# Patient Record
Sex: Female | Born: 1985 | Race: White | Hispanic: No | Marital: Married | Smoking: Never smoker
Health system: Southern US, Community
[De-identification: ages and names within clinical notes are randomized; demographics above are authoritative.]

## PROBLEM LIST (undated history)

## (undated) DIAGNOSIS — K219 Gastro-esophageal reflux disease without esophagitis: Secondary | ICD-10-CM

## (undated) DIAGNOSIS — Z973 Presence of spectacles and contact lenses: Secondary | ICD-10-CM

## (undated) DIAGNOSIS — M1990 Unspecified osteoarthritis, unspecified site: Secondary | ICD-10-CM

## (undated) DIAGNOSIS — J45909 Unspecified asthma, uncomplicated: Secondary | ICD-10-CM

## (undated) DIAGNOSIS — E669 Obesity, unspecified: Secondary | ICD-10-CM

## (undated) DIAGNOSIS — O139 Gestational [pregnancy-induced] hypertension without significant proteinuria, unspecified trimester: Secondary | ICD-10-CM

## (undated) DIAGNOSIS — R51 Headache: Secondary | ICD-10-CM

## (undated) DIAGNOSIS — M359 Systemic involvement of connective tissue, unspecified: Secondary | ICD-10-CM

## (undated) DIAGNOSIS — D699 Hemorrhagic condition, unspecified: Secondary | ICD-10-CM

## (undated) HISTORY — DX: Headache: R51

## (undated) HISTORY — PX: ABDOMINAL HYSTERECTOMY: SHX81

## (undated) HISTORY — DX: Obesity, unspecified: E66.9

## (undated) HISTORY — PX: WISDOM TOOTH EXTRACTION: SHX21

---
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
## 2004-03-10 ENCOUNTER — Ambulatory Visit: Payer: Self-pay | Admitting: Unknown Physician Specialty

## 2005-01-28 ENCOUNTER — Emergency Department: Payer: Self-pay | Admitting: Emergency Medicine

## 2005-02-25 ENCOUNTER — Emergency Department: Payer: Self-pay | Admitting: Emergency Medicine

## 2005-05-03 ENCOUNTER — Emergency Department: Payer: Self-pay | Admitting: Emergency Medicine

## 2007-05-26 ENCOUNTER — Emergency Department: Payer: Self-pay | Admitting: Emergency Medicine

## 2007-07-05 ENCOUNTER — Observation Stay: Payer: Self-pay | Admitting: Unknown Physician Specialty

## 2007-07-14 ENCOUNTER — Inpatient Hospital Stay: Payer: Self-pay

## 2008-08-05 ENCOUNTER — Emergency Department: Payer: Self-pay | Admitting: Emergency Medicine

## 2008-12-28 ENCOUNTER — Inpatient Hospital Stay: Payer: Self-pay | Admitting: Obstetrics and Gynecology

## 2009-12-20 ENCOUNTER — Emergency Department: Payer: Self-pay | Admitting: Emergency Medicine

## 2010-05-15 ENCOUNTER — Emergency Department: Payer: Self-pay | Admitting: Emergency Medicine

## 2010-06-01 ENCOUNTER — Encounter: Payer: Self-pay | Admitting: Family Medicine

## 2010-06-01 ENCOUNTER — Ambulatory Visit (INDEPENDENT_AMBULATORY_CARE_PROVIDER_SITE_OTHER): Payer: Self-pay | Admitting: Family Medicine

## 2010-06-01 DIAGNOSIS — J309 Allergic rhinitis, unspecified: Secondary | ICD-10-CM | POA: Insufficient documentation

## 2010-06-01 DIAGNOSIS — H9209 Otalgia, unspecified ear: Secondary | ICD-10-CM

## 2010-06-01 DIAGNOSIS — J019 Acute sinusitis, unspecified: Secondary | ICD-10-CM

## 2010-06-02 ENCOUNTER — Telehealth: Payer: Self-pay | Admitting: Family Medicine

## 2010-06-10 NOTE — Progress Notes (Signed)
  Phone Note Call from Patient   Summary of Call: patient called in today states that she was seen yesterday wanted to know could you call in some medication in for coughing please send to [facility]  Initial call taken by: Eugenio Hoes,  June 02, 2010 2:45 PM    New/Updated Medications: GUIATUSS AC 100-10 MG/5ML SYRP (GUAIFENESIN-CODEINE) take 1 teaspoon by mouth every 4-6 hours as needed severe cough: May cause drowsiness. Prescriptions: GUIATUSS AC 100-10 MG/5ML SYRP (GUAIFENESIN-CODEINE) take 1 teaspoon by mouth every 4-6 hours as needed severe cough: May cause drowsiness.  #6 oz x 0   Entered and Authorized by:   Standley Dakins MD   Signed by:   Standley Dakins MD on 06/02/2010   Method used:   Printed then faxed to ...       [facility] [address] (retail)       [address], [address]       [place], [place]  [address]       Ph: [phone]       Fax: [phone]   RxID:   [id number]

## 2010-06-10 NOTE — Assessment & Plan Note (Signed)
Summary: ear,throat,sinus problems/jbb   Vital Signs:  Patient Profile:   25 Years Old Female CC:      earpain Right main one Height:     63.75 inches Weight:      168 pounds Temp:     98.3 degrees F oral Pulse rate:   76 / minute Pulse rhythm:   regular Resp:     20 per minute BP sitting:   100 / 70  (right arm) Cuff size:   regular  Vitals Entered By: Providence Crosby LPN (June 01, 5407 3:12 PM)                  Current Allergies: No known allergies History of Present Illness Chief Complaint: earpain Right main one History of Present Illness: right ear pain started 1 week ago with URI symptoms Friday 05/30/2010 ears started hurting and progressed to severe right ear pain that has kept her up at night crying.  She says she has severe nasal congestion and postnasal drainage.  No cough or chest congestion.  No fever or chills but has had a cool mist humidifier running at night.   She has fatigue and exposed to children at home that have been sick with URI and ear infections.  She has a history of migraine headaches and reports that she cannot take nasal sprays because it triggers an attack.  Her husband has been ill with an Upper respiratory illness.    REVIEW OF SYSTEMS Constitutional Symptoms      Denies fever, chills, night sweats, weight loss, weight gain, and fatigue.  Eyes       Complains of glasses and contact lenses.      Denies change in vision, eye pain, eye discharge, and eye surgery. Ear/Nose/Throat/Mouth       Complains of change in hearing, ear pain, dizziness, sinus problems, and sore throat.      Denies hearing loss/aids, ear discharge, frequent runny nose, frequent nose bleeds, hoarseness, and tooth pain or bleeding.  Respiratory       Denies dry cough, productive cough, wheezing, shortness of breath, asthma, bronchitis, and emphysema/COPD.  Cardiovascular       Denies murmurs, chest pain, and tires easily with exhertion.    Gastrointestinal       Denies stomach  pain, nausea/vomiting, diarrhea, constipation, blood in bowel movements, and indigestion. Genitourniary       Denies painful urination, kidney stones, and loss of urinary control. Neurological       Complains of headaches.      Denies paralysis, seizures, and fainting/blackouts. Musculoskeletal       Denies muscle pain, joint pain, joint stiffness, decreased range of motion, redness, swelling, muscle weakness, and gout.  Skin       Denies bruising, unusual mles/lumps or sores, and hair/skin or nail changes.  Psych       Denies mood changes, temper/anger issues, anxiety/stress, speech problems, depression, and sleep problems.  Past History:  Family History: Last updated: 06/01/2010 Father: alive unknown ere  Mother: alive 51 yoa has arthritis and IBS Siblings: 2 sisters alive and well  Social History: Last updated: 06/01/2010 Marital Status: Married Children: 2 boys Occupation: home  Past Medical History: History of migraines  Past Surgical History: Tonsillectomy 02/2004  Family History: Reviewed history and no changes required. Father: alive unknown ere  Mother: alive 45 yoa has arthritis and IBS Siblings: 2 sisters alive and well  Social History: Reviewed history and no changes required. Marital Status: Married  Children: 2 boys Occupation: home Physical Exam General appearance: well developed, well nourished, no acute distress Head: normocephalic, atraumatic Eyes: conjunctivae and lids normal Pupils: equal, round, reactive to light Ears: normal, no lesions or deformities Nasal: marked sinus and nasal congestion Oral/Pharynx: tongue normal, posterior pharynx without erythema or exudate Neck: neck supple,  trachea midline, no masses Chest/Lungs: no rales, wheezes, or rhonchi bilateral, breath sounds equal without effort Heart: regular rate and  rhythm, no murmur Abdomen: soft, non-tender without obvious organomegaly Extremities: normal extremities Neurological:  grossly intact and non-focal Skin: no obvious rashes or lesions MSE: oriented to time, place, and person Assessment New Problems: ACUTE SINUSITIS, UNSPECIFIED (ICD-461.9) ALLERGIC RHINITIS CAUSE UNSPECIFIED (ICD-477.9) EAR PAIN, RIGHT (ICD-388.70)   Patient Education: Patient and/or caregiver instructed in the following: rest, fluids, Tylenol prn. The risks, benefits and possible side effects were clearly explained and discussed with the patient.  The patient verbalized clear understanding.  The patient was given instructions to return if symptoms don't improve, worsen or new changes develop.  If it is not during clinic hours and the patient cannot get back to this clinic then the patient was told to seek medical care at an available urgent care or emergency department.  The patient verbalized understanding.   Demonstrates willingness to comply.  Plan New Medications/Changes: FLUCONAZOLE 150 MG TABS (FLUCONAZOLE) take 1 by mouth x 1 as needed for yeast infection  #1 x 0, 06/01/2010, Clanford Johnson MD AMOXICILLIN 875 MG TABS (AMOXICILLIN) take 1 by mouth two times a day until completed  #20 x 0, 06/01/2010, Clanford Johnson MD ZYRTEC-D ALLERGY & CONGESTION 5-120 MG XR12H-TAB (CETIRIZINE-PSEUDOEPHEDRINE) take 1 by mouth every 12 hours for congestion  #12 x 0, 06/01/2010, Clanford Johnson MD  Follow Up: Follow up in 2-3 days if no improvement, Follow up on an as needed basis, Follow up with Primary Physician  The patient and/or caregiver has been counseled thoroughly with regard to medications prescribed including dosage, schedule, interactions, rationale for use, and possible side effects and they verbalize understanding.  Diagnoses and expected course of recovery discussed and will return if not improved as expected or if the condition worsens. Patient and/or caregiver verbalized understanding.  Prescriptions: FLUCONAZOLE 150 MG TABS (FLUCONAZOLE) take 1 by mouth x 1 as needed for yeast  infection  #1 x 0   Entered and Authorized by:   Standley Dakins MD   Signed by:   Standley Dakins MD on 06/01/2010   Method used:   Electronically to        [facility] (retail)       [address], [address]       [place], [place]  [address]       Ph: [phone]       Fax: [phone]   RxID:   [id number] AMOXICILLIN 875 MG TABS (AMOXICILLIN) take 1 by mouth two times a day until completed  #20 x 0   Entered and Authorized by:   Standley Dakins MD   Signed by:   Standley Dakins MD on 06/01/2010   Method used:   Electronically to        [facility] (retail)       [address], [address]       [place], [place]  [address]       Ph: [phone]       Fax:  [phone]   RxID:   [id number] ZYRTEC-D ALLERGY & CONGESTION 5-120 MG XR12H-TAB (CETIRIZINE-PSEUDOEPHEDRINE) take 1 by mouth every 12 hours for congestion  #12 x 0   Entered and Authorized by:   Standley Dakins MD   Signed by:   Standley Dakins MD on 06/01/2010   Method used:   Electronically to        [facility] (retail)       [address], [address]       [place], [place]  [address]       Ph: [phone]       Fax: [phone]   RxID:   [id number]   Patient Instructions: 1)  Go to the pharmacy and pick up your prescription (s).  It may take up to 30 mins for electronic prescriptions to be delivered to the pharmacy.  Please call if your pharmacy has not received your prescriptions after 30 minutes.   2)  Return or go to the ER if no improvement or symptoms getting worse.   3)  Oral Rehydration Solution: drink 1/2 ounce every 15 minutes. If tolerated afert 1 hour, drink 1 ounce every 15 minutes. As you can tolerate, keep adding 1/2 ounce every 15 minutes, up to a total of 2-4 ounces. Contact the office if unable to tolerate oral solution, if you keep vomiting, or you continue to  have signs of dehydration. 4)  Take your antibiotic as prescribed until ALL of it is gone, but stop if you develop a rash or swelling and contact our office as soon as possible. 5)  Acute sinusitis symptoms for less than 10 days are not helped by antibiotics.Use warm moist compresses, and over the counter decongestants ( only as directed). Call if no improvement in 5-7 days, sooner if increasing pain, fever, or new symptoms. 6)  Take Tramadol only for severe ear pain.  7)  The patient was informed that there is no on-call provider or services available at this clinic during off-hours (when the clinic is closed).  If the patient developed a problem or concern that required immediate attention, the patient was advised to go the the nearest available urgent care or emergency department for medical care.  The patient verbalized understanding.     Appended Document: ear,throat,sinus problems/jbb Written Rx given for Tramadol 50 mg.  Take 1 by mouth q 6 h as needed severe ear pain, #8, NRF. Rodney Langton, MD, CDE, [employer]

## 2010-07-01 NOTE — Letter (Signed)
Summary: History Form  History Form   Imported By: Eugenio Hoes 06/23/2010 13:54:51  _____________________________________________________________________  External Attachment:    Type:   Image     Comment:   External Document

## 2010-07-01 NOTE — Medication Information (Signed)
Summary: Prescription  Prescription   Imported By: Eugenio Hoes 06/23/2010 13:58:28  _____________________________________________________________________  External Attachment:    Type:   Image     Comment:   External Document

## 2010-08-10 ENCOUNTER — Emergency Department: Payer: Self-pay | Admitting: Emergency Medicine

## 2010-09-03 ENCOUNTER — Emergency Department (HOSPITAL_COMMUNITY)
Admission: EM | Admit: 2010-09-03 | Discharge: 2010-09-03 | Disposition: A | Discharge status: Home / Self Care | Payer: Self-pay | Attending: Emergency Medicine | Admitting: Emergency Medicine

## 2010-09-03 DIAGNOSIS — R51 Headache: Secondary | ICD-10-CM | POA: Insufficient documentation

## 2011-03-31 NOTE — L&D Delivery Note (Signed)
Delivery Note  Pre op - 39.[redacted] weeks EGA, latent labor Post op- same Procedure- labor augmentation and NSVD, repair of 1st degree mid line laceration Anesthesia- epidural Comp- none EBL- less than 300 cc Findings- living female with 9&9 Apgars, intact placenta with 3 vessel cord, 1 st degree lac repaired with 3-0 vicryl. She tolerated the procedure well and will remain NPO for a PPS today.

## 2011-05-07 ENCOUNTER — Ambulatory Visit (INDEPENDENT_AMBULATORY_CARE_PROVIDER_SITE_OTHER): Payer: Medicaid Other | Admitting: Family Medicine

## 2011-05-07 VITALS — BP 116/74 | Wt 177.0 lb

## 2011-05-07 DIAGNOSIS — O3680X0 Pregnancy with inconclusive fetal viability, not applicable or unspecified: Secondary | ICD-10-CM

## 2011-05-07 DIAGNOSIS — Z3480 Encounter for supervision of other normal pregnancy, unspecified trimester: Secondary | ICD-10-CM

## 2011-05-07 NOTE — Progress Notes (Signed)
Patient is here for New OB intake, she is interested in first trimester screening.  She is doing well.  She has a history of pre eclempsia with her first pregnancy as well as intrauterine growth retardation.  I am unable to see a clear gestational sac today on bedside ultrasound so I will add an hcg quant to patients labs.  She has taken pregnancy tests since her missed period in November but it just showed negative the last week of January.  She feels like she is approximately [redacted] weeks pregnant.

## 2011-05-08 LAB — HCG, QUANTITATIVE, PREGNANCY: hCG, Beta Chain, Quant, S: 6499 m[IU]/mL

## 2011-05-08 LAB — HIV ANTIBODY (ROUTINE TESTING W REFLEX): HIV: NONREACTIVE

## 2011-05-09 LAB — OBSTETRIC PANEL
Antibody Screen: NEGATIVE
Basophils Absolute: 0 10*3/uL (ref 0.0–0.1)
Basophils Relative: 0 % (ref 0–1)
Eosinophils Absolute: 0 10*3/uL (ref 0.0–0.7)
Eosinophils Relative: 0 % (ref 0–5)
Lymphocytes Relative: 25 % (ref 12–46)
MCH: 30.1 pg (ref 26.0–34.0)
MCHC: 33.3 g/dL (ref 30.0–36.0)
MCV: 90.3 fL (ref 78.0–100.0)
Platelets: 242 10*3/uL (ref 150–400)
RDW: 12.7 % (ref 11.5–15.5)
WBC: 6.5 10*3/uL (ref 4.0–10.5)

## 2011-05-09 LAB — URINE CULTURE: Colony Count: 3000

## 2011-06-09 ENCOUNTER — Encounter: Payer: Medicaid Other | Admitting: Nurse Practitioner

## 2011-06-10 ENCOUNTER — Ambulatory Visit (INDEPENDENT_AMBULATORY_CARE_PROVIDER_SITE_OTHER): Payer: Medicaid Other | Admitting: Obstetrics and Gynecology

## 2011-06-10 ENCOUNTER — Other Ambulatory Visit (HOSPITAL_COMMUNITY)
Admission: RE | Admit: 2011-06-10 | Discharge: 2011-06-10 | Disposition: A | Discharge status: Home / Self Care | Payer: Medicaid Other | Source: Ambulatory Visit | Attending: Obstetrics and Gynecology | Admitting: Obstetrics and Gynecology

## 2011-06-10 ENCOUNTER — Ambulatory Visit (HOSPITAL_COMMUNITY)
Admission: RE | Admit: 2011-06-10 | Discharge: 2011-06-10 | Disposition: A | Discharge status: Home / Self Care | Payer: Medicaid Other | Source: Ambulatory Visit | Attending: Obstetrics and Gynecology | Admitting: Obstetrics and Gynecology

## 2011-06-10 VITALS — BP 110/67 | Wt 171.0 lb

## 2011-06-10 DIAGNOSIS — O09299 Supervision of pregnancy with other poor reproductive or obstetric history, unspecified trimester: Secondary | ICD-10-CM

## 2011-06-10 DIAGNOSIS — O3680X0 Pregnancy with inconclusive fetal viability, not applicable or unspecified: Secondary | ICD-10-CM

## 2011-06-10 DIAGNOSIS — Z01419 Encounter for gynecological examination (general) (routine) without abnormal findings: Secondary | ICD-10-CM | POA: Insufficient documentation

## 2011-06-10 DIAGNOSIS — G43909 Migraine, unspecified, not intractable, without status migrainosus: Secondary | ICD-10-CM | POA: Insufficient documentation

## 2011-06-10 DIAGNOSIS — Z3689 Encounter for other specified antenatal screening: Secondary | ICD-10-CM | POA: Insufficient documentation

## 2011-06-10 DIAGNOSIS — Z113 Encounter for screening for infections with a predominantly sexual mode of transmission: Secondary | ICD-10-CM | POA: Insufficient documentation

## 2011-06-10 DIAGNOSIS — G4700 Insomnia, unspecified: Secondary | ICD-10-CM

## 2011-06-10 DIAGNOSIS — Z3480 Encounter for supervision of other normal pregnancy, unspecified trimester: Secondary | ICD-10-CM | POA: Insufficient documentation

## 2011-06-10 NOTE — Progress Notes (Signed)
   Subjective:    Tina Holder is a [id number] [redacted]w[redacted]d being seen today for her first obstetrical visit.  Her obstetrical history is significant for intrauterine growth restriction (IUGR), pre-eclampsia and in her first pregnancy. Patient was never diagnosed with hypertension following her first pregnancy and did not have BP issues with her second pregnancy.Patient does intend to breast feed.  Patient with long standing h/o migraine headaches well controlled per patient and well managed with a chiropractor which she intends on continuing to see during pregnancy. Pregnancy history fully reviewed.  Patient reports nausea.  Filed Vitals:   06/10/11 1000  BP: 110/67  Weight: 171 lb (77.565 kg)    HISTORY: OB History    Grav Para Term Preterm Abortions TAB SAB Ect Mult Living   3 2 2  0 0 0 0 0 0 2     # Outc Date GA Lbr Len/2nd Wgt Sex Del Anes PTL Lv   1 TRM 4/09 [redacted]w[redacted]d  4lb4oz(1.928kg) M SVD None  Yes   Comments: Induced for pre eclempsia/ intrauterine growth restriction/bedrest for 3 weeks.   2 TRM 10/10 [redacted]w[redacted]d  6lb3oz(2.807kg) M SVD None No Yes   Comments: No complications   3 CUR              Past Medical History  Diagnosis Date  . Headache    Past Surgical History  Procedure Date  . Tonsillectomy 2005   Family History  Problem Relation Age of Onset  . Arthritis Mother   . Thyroid disease Maternal Grandmother   . Lupus Maternal Grandmother      Exam    Uterine Size: approximately 12 weeks  Pelvic Exam:    Perineum: No Hemorrhoids, Normal Perineum   Vulva: normal   Vagina:  normal mucosa, normal discharge   pH:    Cervix: closed and long   Adnexa: normal adnexa and no mass, fullness, tenderness   Bony Pelvis: adequate  System: Breast:  normal appearance, no masses or tenderness, No nipple retraction or dimpling   Skin: normal coloration and turgor, no rashes    Neurologic: oriented, grossly non-focal   Extremities: normal strength, tone, and muscle mass, no  erythema, induration, or nodules   HEENT extra ocular movement intact   Mouth/Teeth mucous membranes moist, pharynx normal without lesions and dental hygiene good   Neck supple and no masses   Cardiovascular: regular rate and rhythm   Respiratory:  chest clear, no wheezing, crepitations, rhonchi, normal symmetric air entry   Abdomen: soft, non-tender; bowel sounds normal; no masses,  no organomegaly   Urinary:       Assessment:    Pregnancy: [id number] Patient Active Problem List  Diagnoses  . ALLERGIC RHINITIS CAUSE UNSPECIFIED  . Supervision of other normal pregnancy  . Low birth weight in full term infant, 1750-1999 grams        Plan:     Initial labs drawn. Prenatal vitamins. Problem list reviewed and updated. Genetic Screening discussed Quad Screen: requested.  Ultrasound discussed; fetal survey: requested.  Follow up in 4 weeks.      Tina Holder 06/10/2011 F/U dating ultrasound today. Quad screen next visit

## 2011-06-10 NOTE — Patient Instructions (Signed)
Pregnancy - Second Trimester The second trimester of pregnancy (3 to 6 months) is a period of rapid growth for you and your baby. At the end of the sixth month, your baby is about 9 inches long and weighs 1 1/2 pounds. You will begin to feel the baby move between 18 and 20 weeks of the pregnancy. This is called quickening. Weight gain is faster. A clear fluid (colostrum) may leak out of your breasts. You may feel small contractions of the womb (uterus). This is known as false labor or Braxton-Hicks contractions. This is like a practice for labor when the baby is ready to be born. Usually, the problems with morning sickness have usually passed by the end of your first trimester. Some women develop small dark blotches (called cholasma, mask of pregnancy) on their face that usually goes away after the baby is born. Exposure to the sun makes the blotches worse. Acne may also develop in some pregnant women and pregnant women who have acne, may find that it goes away. PRENATAL EXAMS  Blood work may continue to be done during prenatal exams. These tests are done to check on your health and the probable health of your baby. Blood work is used to follow your blood levels (hemoglobin). Anemia (low hemoglobin) is common during pregnancy. Iron and vitamins are given to help prevent this. You will also be checked for diabetes between 24 and 28 weeks of the pregnancy. Some of the previous blood tests may be repeated.   The size of the uterus is measured during each visit. This is to make sure that the baby is continuing to grow properly according to the dates of the pregnancy.   Your blood pressure is checked every prenatal visit. This is to make sure you are not getting toxemia.   Your urine is checked to make sure you do not have an infection, diabetes or protein in the urine.   Your weight is checked often to make sure gains are happening at the suggested rate. This is to ensure that both you and your baby are  growing normally.   Sometimes, an ultrasound is performed to confirm the proper growth and development of the baby. This is a test which bounces harmless sound waves off the baby so your caregiver can more accurately determine due dates.  Sometimes, a specialized test is done on the amniotic fluid surrounding the baby. This test is called an amniocentesis. The amniotic fluid is obtained by sticking a needle into the belly (abdomen). This is done to check the chromosomes in instances where there is a concern about possible genetic problems with the baby. It is also sometimes done near the end of pregnancy if an early delivery is required. In this case, it is done to help make sure the baby's lungs are mature enough for the baby to live outside of the womb. CHANGES OCCURING IN THE SECOND TRIMESTER OF PREGNANCY Your body goes through many changes during pregnancy. They vary from person to person. Talk to your caregiver about changes you notice that you are concerned about.  During the second trimester, you will likely have an increase in your appetite. It is normal to have cravings for certain foods. This varies from person to person and pregnancy to pregnancy.   Your lower abdomen will begin to bulge.   You may have to urinate more often because the uterus and baby are pressing on your bladder. It is also common to get more bladder infections during pregnancy (  pain with urination). You can help this by drinking lots of fluids and emptying your bladder before and after intercourse.   You may begin to get stretch marks on your hips, abdomen, and breasts. These are normal changes in the body during pregnancy. There are no exercises or medications to take that prevent this change.   You may begin to develop swollen and bulging veins (varicose veins) in your legs. Wearing support hose, elevating your feet for 15 minutes, 3 to 4 times a day and limiting salt in your diet helps lessen the problem.    Heartburn may develop as the uterus grows and pushes up against the stomach. Antacids recommended by your caregiver helps with this problem. Also, eating smaller meals 4 to 5 times a day helps.   Constipation can be treated with a stool softener or adding bulk to your diet. Drinking lots of fluids, vegetables, fruits, and whole grains are helpful.   Exercising is also helpful. If you have been very active up until your pregnancy, most of these activities can be continued during your pregnancy. If you have been less active, it is helpful to start an exercise program such as walking.   Hemorrhoids (varicose veins in the rectum) may develop at the end of the second trimester. Warm sitz baths and hemorrhoid cream recommended by your caregiver helps hemorrhoid problems.   Backaches may develop during this time of your pregnancy. Avoid heavy lifting, wear low heal shoes and practice good posture to help with backache problems.   Some pregnant women develop tingling and numbness of their hand and fingers because of swelling and tightening of ligaments in the wrist (carpel tunnel syndrome). This goes away after the baby is born.   As your breasts enlarge, you may have to get a bigger bra. Get a comfortable, cotton, support bra. Do not get a nursing bra until the last month of the pregnancy if you will be nursing the baby.   You may get a dark line from your belly button to the pubic area called the linea nigra.   You may develop rosy cheeks because of increase blood flow to the face.   You may develop spider looking lines of the face, neck, arms and chest. These go away after the baby is born.  HOME CARE INSTRUCTIONS   It is extremely important to avoid all smoking, herbs, alcohol, and unprescribed drugs during your pregnancy. These chemicals affect the formation and growth of the baby. Avoid these chemicals throughout the pregnancy to ensure the delivery of a healthy infant.   Most of your home  care instructions are the same as suggested for the first trimester of your pregnancy. Keep your caregiver's appointments. Follow your caregiver's instructions regarding medication use, exercise and diet.   During pregnancy, you are providing food for you and your baby. Continue to eat regular, well-balanced meals. Choose foods such as meat, fish, milk and other low fat dairy products, vegetables, fruits, and whole-grain breads and cereals. Your caregiver will tell you of the ideal weight gain.   A physical sexual relationship may be continued up until near the end of pregnancy if there are no other problems. Problems could include early (premature) leaking of amniotic fluid from the membranes, vaginal bleeding, abdominal pain, or other medical or pregnancy problems.   Exercise regularly if there are no restrictions. Check with your caregiver if you are unsure of the safety of some of your exercises. The greatest weight gain will occur in the   last 2 trimesters of pregnancy. Exercise will help you:   Control your weight.   Get you in shape for labor and delivery.   Lose weight after you have the baby.   Wear a good support or jogging bra for breast tenderness during pregnancy. This may help if worn during sleep. Pads or tissues may be used in the bra if you are leaking colostrum.   Do not use hot tubs, steam rooms or saunas throughout the pregnancy.   Wear your seat belt at all times when driving. This protects you and your baby if you are in an accident.   Avoid raw meat, uncooked cheese, cat litter boxes and soil used by cats. These carry germs that can cause birth defects in the baby.   The second trimester is also a good time to visit your dentist for your dental health if this has not been done yet. Getting your teeth cleaned is OK. Use a soft toothbrush. Brush gently during pregnancy.   It is easier to loose urine during pregnancy. Tightening up and strengthening the pelvic muscles will  help with this problem. Practice stopping your urination while you are going to the bathroom. These are the same muscles you need to strengthen. It is also the muscles you would use as if you were trying to stop from passing gas. You can practice tightening these muscles up 10 times a set and repeating this about 3 times per day. Once you know what muscles to tighten up, do not perform these exercises during urination. It is more likely to contribute to an infection by backing up the urine.   Ask for help if you have financial, counseling or nutritional needs during pregnancy. Your caregiver will be able to offer counseling for these needs as well as refer you for other special needs.   Your skin may become oily. If so, wash your face with mild soap, use non-greasy moisturizer and oil or cream based makeup.  MEDICATIONS AND DRUG USE IN PREGNANCY  Take prenatal vitamins as directed. The vitamin should contain 1 milligram of folic acid. Keep all vitamins out of reach of children. Only a couple vitamins or tablets containing iron may be fatal to a baby or young child when ingested.   Avoid use of all medications, including herbs, over-the-counter medications, not prescribed or suggested by your caregiver. Only take over-the-counter or prescription medicines for pain, discomfort, or fever as directed by your caregiver. Do not use aspirin.   Let your caregiver also know about herbs you may be using.   Alcohol is related to a number of birth defects. This includes fetal alcohol syndrome. All alcohol, in any form, should be avoided completely. Smoking will cause low birth rate and premature babies.   Street or illegal drugs are very harmful to the baby. They are absolutely forbidden. A baby born to an addicted mother will be addicted at birth. The baby will go through the same withdrawal an adult does.  SEEK MEDICAL CARE IF:  You have any concerns or worries during your pregnancy. It is better to call with  your questions if you feel they cannot wait, rather than worry about them. SEEK IMMEDIATE MEDICAL CARE IF:   An unexplained oral temperature above 100.4 F (38 C) develops, or as your caregiver suggests.   You have leaking of fluid from the vagina (birth canal). If leaking membranes are suspected, take your temperature and tell your caregiver of this when you call.   There   is vaginal spotting, bleeding, or passing clots. Tell your caregiver of the amount and how many pads are used. Light spotting in pregnancy is common, especially following intercourse.   You develop a bad smelling vaginal discharge with a change in the color from clear to white.   You continue to feel sick to your stomach (nauseated) and have no relief from remedies suggested. You vomit blood or coffee ground-like materials.   You lose more than 2 pounds of weight or gain more than 2 pounds of weight over 1 week, or as suggested by your caregiver.   You notice swelling of your face, hands, feet, or legs.   You get exposed to German measles and have never had them.   You are exposed to fifth disease or chickenpox.   You develop belly (abdominal) pain. Round ligament discomfort is a common non-cancerous (benign) cause of abdominal pain in pregnancy. Your caregiver still must evaluate you.   You develop a bad headache that does not go away.   You develop fever, diarrhea, pain with urination, or shortness of breath.   You develop visual problems, blurry, or double vision.   You fall or are in a car accident or any kind of trauma.   There is mental or physical violence at home.  Document Released: 03/10/2001 Document Revised: 03/05/2011 Document Reviewed: 09/12/2008 [employer] Patient Information 2012 [employer]. 

## 2011-06-11 ENCOUNTER — Encounter: Payer: Self-pay | Admitting: Obstetrics and Gynecology

## 2011-06-11 MED ORDER — ZOLPIDEM TARTRATE 10 MG PO TABS: 10.0000 mg | ORAL_TABLET | Freq: Every evening | ORAL | Status: AC | PRN

## 2011-06-11 MED ORDER — CONCEPT OB 130-92.4-1 MG PO CAPS: 1.0000 | ORAL_CAPSULE | Freq: Every day | ORAL | Status: DC

## 2011-06-11 NOTE — Progress Notes (Signed)
Addended by: Barbara Cower on: 06/11/2011 03:33 PM   Modules accepted: Orders

## 2011-06-16 ENCOUNTER — Ambulatory Visit (INDEPENDENT_AMBULATORY_CARE_PROVIDER_SITE_OTHER): Payer: Medicaid Other | Admitting: Nurse Practitioner

## 2011-06-16 ENCOUNTER — Encounter: Payer: Self-pay | Admitting: Nurse Practitioner

## 2011-06-16 ENCOUNTER — Other Ambulatory Visit: Payer: Self-pay | Admitting: Obstetrics and Gynecology

## 2011-06-16 VITALS — BP 99/69 | HR 81 | Ht 62.0 in | Wt 169.0 lb

## 2011-06-16 DIAGNOSIS — Z3682 Encounter for antenatal screening for nuchal translucency: Secondary | ICD-10-CM

## 2011-06-16 DIAGNOSIS — Z3480 Encounter for supervision of other normal pregnancy, unspecified trimester: Secondary | ICD-10-CM

## 2011-06-16 DIAGNOSIS — G43909 Migraine, unspecified, not intractable, without status migrainosus: Secondary | ICD-10-CM

## 2011-06-16 MED ORDER — SUMATRIPTAN SUCCINATE 100 MG PO TABS: 100.0000 mg | ORAL_TABLET | Freq: Once | ORAL | Status: DC | PRN

## 2011-06-16 MED ORDER — PROMETHAZINE HCL 25 MG PO TABS: 25.0000 mg | ORAL_TABLET | Freq: Four times a day (QID) | ORAL | Status: DC | PRN

## 2011-06-16 NOTE — Patient Instructions (Signed)

## 2011-06-16 NOTE — Progress Notes (Signed)
New headache.  She has not been sleeping well, was prescribed ambien to help.  She does have a migraine now.

## 2011-06-16 NOTE — Progress Notes (Signed)
Diagnosis: Migraine without aura. Pregnancy [redacted]w[redacted]d  History: Pt has had migraine since she was 26yo. She is currently pregnant. G3P2. This was planned pregnancy.  She has lost weight since she has been pregnant due to nausea, vomiting and anorexia. This has contributed to migraines.  She will get a migraine and it typically lasts for 3 days. She went to ER in June and was given an RX for Fiorinal and Ultram. She uses either of these when she gets a migraine. Her pain is "7" on 1-10 scale. She has not had medical care in past as she has not had insurance.    Location: Bilateral temples, bilateral occipital   Number of Headache days/month: Severe: 4 that last 3 days each. Moderate: 5 Mild:5  Current Outpatient Prescriptions on File Prior to Visit  Medication Sig Dispense Refill  . ACETAMINOPHEN-BUTALBITAL 50-650 MG TABS Take by mouth.      . butalbital-acetaminophen-caffeine (FIORICET WITH CODEINE) 50-325-40-30 MG per capsule Take 1 capsule by mouth every 4 (four) hours as needed.      Burnis Medin w/o A Vit-FeFum-FePo-FA (CONCEPT OB) 130-92.4-1 MG CAPS Take 1 capsule by mouth daily.  30 capsule  11  . zolpidem (AMBIEN) 10 MG tablet Take 1 tablet (10 mg total) by mouth at bedtime as needed for sleep.  30 tablet  0    Acute prevention: uses tylenol with no help  Past Medical History  Diagnosis Date  . Headache    Past Surgical History  Procedure Date  . Tonsillectomy 2005   Family History  Problem Relation Age of Onset  . Arthritis Mother   . Thyroid disease Maternal Grandmother   . Lupus Maternal Grandmother    Social History:  reports that she has never smoked. She has never used smokeless tobacco. She reports that she does not drink alcohol or use illicit drugs. Allergies: No Known Allergies  Triggers: Hormones, stress, onions  Birth control: Currently pregnant/ Plans BTL after delivery  ROS: Positive nausea, vomiting, vertigo and photophobia when she has migraine  Exam: Well  developed, well nourished Caucasian female  General: Pregnant, FHT seen with U/S HEENT: Negative Cardiac: RRR Lungs: Clear Neuro: Negative Skin: Warm and dry  Impression:migraine - common  Plan: We discussed the risk/ benefit ratio of medications in pregnancy and patient is willing to accept risks. She will Use Phenergan more frequently to help with nausea and headache. Hopefully if she is able to eat and stay hydrated on a regular basis that will help migraines. She will be given Imitrex to take on a prn basis. This should give her some help in stopping headache at beginning and not having her pain last 3 days. She is offered trigger point injections on as needed basis. She is reassured that her headaches will likely get better as she progresses in pregnancy.   Time Spent: 45 min

## 2011-06-23 ENCOUNTER — Other Ambulatory Visit (HOSPITAL_COMMUNITY): Payer: Medicaid Other

## 2011-06-24 ENCOUNTER — Other Ambulatory Visit: Payer: Self-pay

## 2011-06-24 ENCOUNTER — Ambulatory Visit (HOSPITAL_COMMUNITY)
Admission: RE | Admit: 2011-06-24 | Discharge: 2011-06-24 | Disposition: A | Discharge status: Home / Self Care | Payer: Medicaid Other | Source: Ambulatory Visit | Attending: Obstetrics and Gynecology | Admitting: Obstetrics and Gynecology

## 2011-06-24 DIAGNOSIS — Z3682 Encounter for antenatal screening for nuchal translucency: Secondary | ICD-10-CM

## 2011-06-24 DIAGNOSIS — O3510X0 Maternal care for (suspected) chromosomal abnormality in fetus, unspecified, not applicable or unspecified: Secondary | ICD-10-CM | POA: Insufficient documentation

## 2011-06-24 DIAGNOSIS — Z3689 Encounter for other specified antenatal screening: Secondary | ICD-10-CM | POA: Insufficient documentation

## 2011-06-24 DIAGNOSIS — O351XX0 Maternal care for (suspected) chromosomal abnormality in fetus, not applicable or unspecified: Secondary | ICD-10-CM | POA: Insufficient documentation

## 2011-06-24 DIAGNOSIS — O09299 Supervision of pregnancy with other poor reproductive or obstetric history, unspecified trimester: Secondary | ICD-10-CM | POA: Insufficient documentation

## 2011-07-06 ENCOUNTER — Encounter: Payer: Self-pay | Admitting: Obstetrics and Gynecology

## 2011-07-07 ENCOUNTER — Encounter: Payer: Medicaid Other | Admitting: Nurse Practitioner

## 2011-07-08 ENCOUNTER — Ambulatory Visit (INDEPENDENT_AMBULATORY_CARE_PROVIDER_SITE_OTHER): Payer: Medicaid Other | Admitting: Obstetrics and Gynecology

## 2011-07-08 VITALS — BP 115/79 | Wt 170.0 lb

## 2011-07-08 DIAGNOSIS — Z3480 Encounter for supervision of other normal pregnancy, unspecified trimester: Secondary | ICD-10-CM

## 2011-07-08 DIAGNOSIS — O09299 Supervision of pregnancy with other poor reproductive or obstetric history, unspecified trimester: Secondary | ICD-10-CM

## 2011-07-08 DIAGNOSIS — G43909 Migraine, unspecified, not intractable, without status migrainosus: Secondary | ICD-10-CM

## 2011-07-08 NOTE — Progress Notes (Signed)
Patient is here for routine prenatal check. 

## 2011-07-08 NOTE — Progress Notes (Signed)
Patient doing well without complaints. Results of NT reviewed. Will schedule anatomy ultrasound week of May 13. Patient to scheduled to see Jannifer Rodney in 2 weeks for persistent headaches despite Imitrix

## 2011-07-14 ENCOUNTER — Ambulatory Visit (INDEPENDENT_AMBULATORY_CARE_PROVIDER_SITE_OTHER): Payer: Medicaid Other | Admitting: Nurse Practitioner

## 2011-07-14 ENCOUNTER — Encounter: Payer: Self-pay | Admitting: Nurse Practitioner

## 2011-07-14 ENCOUNTER — Telehealth: Payer: Self-pay

## 2011-07-14 DIAGNOSIS — G43909 Migraine, unspecified, not intractable, without status migrainosus: Secondary | ICD-10-CM

## 2011-07-14 MED ORDER — ACETAMINOPHEN-CODEINE 300-30 MG PO TABS: 1.0000 | ORAL_TABLET | ORAL | Status: DC | PRN

## 2011-07-14 MED ORDER — RIZATRIPTAN BENZOATE 10 MG PO TABS: 10.0000 mg | ORAL_TABLET | Freq: Once | ORAL | Status: DC | PRN

## 2011-07-14 NOTE — Patient Instructions (Signed)

## 2011-07-14 NOTE — Telephone Encounter (Signed)
PATIENT LEFT HER TYLENOL #3 PRESCRIPTION BEHIND, I CALLED IT IN FOR PATIENT TO HER [facility].

## 2011-07-14 NOTE — Progress Notes (Signed)
Patient is here for headache follow up.  The imitrex does not work for her.

## 2011-07-14 NOTE — Progress Notes (Signed)
S: Pt returns today to follow up on migraines in pregnancy. She is now 15w and 2 days pregnant. She had to call the office 2 weeks ago due to a very bad headache and we gave her a prednisone taper. Since then she has done very well, having only 2 headaches since then. She does not feel the Imitrex was very helpful and would like to switch.  She has taken phenergan nightly and zofran during the day when needed. She still has significant nausea/ vomiting at times. Overall she is doing very well and going to beach for family vacation.  O: Alert, oriented, NAD Cardiac: RRR Lungs: clear  FHT 152  A: migraine in early pregnancy/ improving  P: Switch to Maxalt. She has requested rescue for vacation, will give tylenol # 3 RTC PRN

## 2011-08-04 ENCOUNTER — Telehealth: Payer: Self-pay

## 2011-08-04 NOTE — Telephone Encounter (Signed)
Patient called she has had a migraine for 5 days now and it won't go away. She would like to have Prednisone called in the last time Bonita Quin called it in it really worked for her. Per Bonita Quin I called in a taper for her Prednisone 20 mg to her [facility]. Left her a message to schedule a follow up with linda regarding her headaches.

## 2011-08-05 ENCOUNTER — Ambulatory Visit (INDEPENDENT_AMBULATORY_CARE_PROVIDER_SITE_OTHER): Payer: Medicaid Other | Admitting: Obstetrics & Gynecology

## 2011-08-05 ENCOUNTER — Encounter: Payer: Self-pay | Admitting: Obstetrics & Gynecology

## 2011-08-05 ENCOUNTER — Ambulatory Visit (HOSPITAL_COMMUNITY)
Admission: RE | Admit: 2011-08-05 | Discharge: 2011-08-05 | Disposition: A | Discharge status: Home / Self Care | Payer: Medicaid Other | Source: Ambulatory Visit | Attending: Obstetrics and Gynecology | Admitting: Obstetrics and Gynecology

## 2011-08-05 VITALS — BP 101/62 | Wt 170.0 lb

## 2011-08-05 DIAGNOSIS — Z3480 Encounter for supervision of other normal pregnancy, unspecified trimester: Secondary | ICD-10-CM

## 2011-08-05 DIAGNOSIS — O358XX0 Maternal care for other (suspected) fetal abnormality and damage, not applicable or unspecified: Secondary | ICD-10-CM | POA: Insufficient documentation

## 2011-08-05 DIAGNOSIS — Z363 Encounter for antenatal screening for malformations: Secondary | ICD-10-CM | POA: Insufficient documentation

## 2011-08-05 DIAGNOSIS — Z1389 Encounter for screening for other disorder: Secondary | ICD-10-CM | POA: Insufficient documentation

## 2011-08-05 DIAGNOSIS — O09299 Supervision of pregnancy with other poor reproductive or obstetric history, unspecified trimester: Secondary | ICD-10-CM | POA: Insufficient documentation

## 2011-08-05 NOTE — Progress Notes (Signed)
Routine visit. No problems. Denies VB, ROM, CTXs. Reports FM. She has her anatomy scan today at 1pm.

## 2011-08-18 ENCOUNTER — Encounter: Payer: Medicaid Other | Admitting: Nurse Practitioner

## 2011-08-18 ENCOUNTER — Ambulatory Visit (INDEPENDENT_AMBULATORY_CARE_PROVIDER_SITE_OTHER): Payer: Medicaid Other | Admitting: Nurse Practitioner

## 2011-08-18 ENCOUNTER — Encounter: Payer: Self-pay | Admitting: Nurse Practitioner

## 2011-08-18 DIAGNOSIS — G43919 Migraine, unspecified, intractable, without status migrainosus: Secondary | ICD-10-CM

## 2011-08-18 NOTE — Patient Instructions (Signed)

## 2011-08-18 NOTE — Progress Notes (Signed)
I recently received authorization for the Maxalt to be covered by her insurance.  We have been awaiting this authorization.  S: In office today for follow up on migraine headaches. Pt is feeling better with nausea and vomiting after stopping PNV. She has started 2 [employer] daily. She is also starting to put on weight. She is currently 20 weeks and 2 days. She did not feel that the Imitrex was helpful and was called in Maxalt. Her insurance company has been slow to approve and she has not gotten that medication yet. She has taken tylenol # 3 that helps but does not make migraine go away. She has had an emotional week this week that she cant explain. Declines appointment with counselor.  O: Well developed, well developed Caucasian pregnant female. FHT 148  A: Migraine and pregnancy  P: Pt is given Relpax 40mg  samples # 4 tabs. She will try to get her Maxalt filled. RTC one month

## 2011-08-26 ENCOUNTER — Ambulatory Visit (INDEPENDENT_AMBULATORY_CARE_PROVIDER_SITE_OTHER): Payer: Medicaid Other | Admitting: Family Medicine

## 2011-08-26 VITALS — BP 96/66 | Wt 176.0 lb

## 2011-08-26 DIAGNOSIS — Z3480 Encounter for supervision of other normal pregnancy, unspecified trimester: Secondary | ICD-10-CM

## 2011-08-26 NOTE — Progress Notes (Signed)
Doing well--Relpax is working.  AFP today.

## 2011-08-26 NOTE — Patient Instructions (Signed)
Pregnancy - Second Trimester The second trimester of pregnancy (3 to 6 months) is a period of rapid growth for you and your baby. At the end of the sixth month, your baby is about 9 inches long and weighs 1 1/2 pounds. You will begin to feel the baby move between 18 and 20 weeks of the pregnancy. This is called quickening. Weight gain is faster. A clear fluid (colostrum) may leak out of your breasts. You may feel small contractions of the womb (uterus). This is known as false labor or Braxton-Hicks contractions. This is like a practice for labor when the baby is ready to be born. Usually, the problems with morning sickness have usually passed by the end of your first trimester. Some women develop small dark blotches (called cholasma, mask of pregnancy) on their face that usually goes away after the baby is born. Exposure to the sun makes the blotches worse. Acne may also develop in some pregnant women and pregnant women who have acne, may find that it goes away. PRENATAL EXAMS  Blood work may continue to be done during prenatal exams. These tests are done to check on your health and the probable health of your baby. Blood work is used to follow your blood levels (hemoglobin). Anemia (low hemoglobin) is common during pregnancy. Iron and vitamins are given to help prevent this. You will also be checked for diabetes between 24 and 28 weeks of the pregnancy. Some of the previous blood tests may be repeated.   The size of the uterus is measured during each visit. This is to make sure that the baby is continuing to grow properly according to the dates of the pregnancy.   Your blood pressure is checked every prenatal visit. This is to make sure you are not getting toxemia.   Your urine is checked to make sure you do not have an infection, diabetes or protein in the urine.   Your weight is checked often to make sure gains are happening at the suggested rate. This is to ensure that both you and your baby are  growing normally.   Sometimes, an ultrasound is performed to confirm the proper growth and development of the baby. This is a test which bounces harmless sound waves off the baby so your caregiver can more accurately determine due dates.  Sometimes, a specialized test is done on the amniotic fluid surrounding the baby. This test is called an amniocentesis. The amniotic fluid is obtained by sticking a needle into the belly (abdomen). This is done to check the chromosomes in instances where there is a concern about possible genetic problems with the baby. It is also sometimes done near the end of pregnancy if an early delivery is required. In this case, it is done to help make sure the baby's lungs are mature enough for the baby to live outside of the womb. CHANGES OCCURING IN THE SECOND TRIMESTER OF PREGNANCY Your body goes through many changes during pregnancy. They vary from person to person. Talk to your caregiver about changes you notice that you are concerned about.  During the second trimester, you will likely have an increase in your appetite. It is normal to have cravings for certain foods. This varies from person to person and pregnancy to pregnancy.   Your lower abdomen will begin to bulge.   You may have to urinate more often because the uterus and baby are pressing on your bladder. It is also common to get more bladder infections during pregnancy (  pain with urination). You can help this by drinking lots of fluids and emptying your bladder before and after intercourse.   You may begin to get stretch marks on your hips, abdomen, and breasts. These are normal changes in the body during pregnancy. There are no exercises or medications to take that prevent this change.   You may begin to develop swollen and bulging veins (varicose veins) in your legs. Wearing support hose, elevating your feet for 15 minutes, 3 to 4 times a day and limiting salt in your diet helps lessen the problem.    Heartburn may develop as the uterus grows and pushes up against the stomach. Antacids recommended by your caregiver helps with this problem. Also, eating smaller meals 4 to 5 times a day helps.   Constipation can be treated with a stool softener or adding bulk to your diet. Drinking lots of fluids, vegetables, fruits, and whole grains are helpful.   Exercising is also helpful. If you have been very active up until your pregnancy, most of these activities can be continued during your pregnancy. If you have been less active, it is helpful to start an exercise program such as walking.   Hemorrhoids (varicose veins in the rectum) may develop at the end of the second trimester. Warm sitz baths and hemorrhoid cream recommended by your caregiver helps hemorrhoid problems.   Backaches may develop during this time of your pregnancy. Avoid heavy lifting, wear low heal shoes and practice good posture to help with backache problems.   Some pregnant women develop tingling and numbness of their hand and fingers because of swelling and tightening of ligaments in the wrist (carpel tunnel syndrome). This goes away after the baby is born.   As your breasts enlarge, you may have to get a bigger bra. Get a comfortable, cotton, support bra. Do not get a nursing bra until the last month of the pregnancy if you will be nursing the baby.   You may get a dark line from your belly button to the pubic area called the linea nigra.   You may develop rosy cheeks because of increase blood flow to the face.   You may develop spider looking lines of the face, neck, arms and chest. These go away after the baby is born.  HOME CARE INSTRUCTIONS   It is extremely important to avoid all smoking, herbs, alcohol, and unprescribed drugs during your pregnancy. These chemicals affect the formation and growth of the baby. Avoid these chemicals throughout the pregnancy to ensure the delivery of a healthy infant.   Most of your home  care instructions are the same as suggested for the first trimester of your pregnancy. Keep your caregiver's appointments. Follow your caregiver's instructions regarding medication use, exercise and diet.   During pregnancy, you are providing food for you and your baby. Continue to eat regular, well-balanced meals. Choose foods such as meat, fish, milk and other low fat dairy products, vegetables, fruits, and whole-grain breads and cereals. Your caregiver will tell you of the ideal weight gain.   A physical sexual relationship may be continued up until near the end of pregnancy if there are no other problems. Problems could include early (premature) leaking of amniotic fluid from the membranes, vaginal bleeding, abdominal pain, or other medical or pregnancy problems.   Exercise regularly if there are no restrictions. Check with your caregiver if you are unsure of the safety of some of your exercises. The greatest weight gain will occur in the   last 2 trimesters of pregnancy. Exercise will help you:   Control your weight.   Get you in shape for labor and delivery.   Lose weight after you have the baby.   Wear a good support or jogging bra for breast tenderness during pregnancy. This may help if worn during sleep. Pads or tissues may be used in the bra if you are leaking colostrum.   Do not use hot tubs, steam rooms or saunas throughout the pregnancy.   Wear your seat belt at all times when driving. This protects you and your baby if you are in an accident.   Avoid raw meat, uncooked cheese, cat litter boxes and soil used by cats. These carry germs that can cause birth defects in the baby.   The second trimester is also a good time to visit your dentist for your dental health if this has not been done yet. Getting your teeth cleaned is OK. Use a soft toothbrush. Brush gently during pregnancy.   It is easier to loose urine during pregnancy. Tightening up and strengthening the pelvic muscles will  help with this problem. Practice stopping your urination while you are going to the bathroom. These are the same muscles you need to strengthen. It is also the muscles you would use as if you were trying to stop from passing gas. You can practice tightening these muscles up 10 times a set and repeating this about 3 times per day. Once you know what muscles to tighten up, do not perform these exercises during urination. It is more likely to contribute to an infection by backing up the urine.   Ask for help if you have financial, counseling or nutritional needs during pregnancy. Your caregiver will be able to offer counseling for these needs as well as refer you for other special needs.   Your skin may become oily. If so, wash your face with mild soap, use non-greasy moisturizer and oil or cream based makeup.  MEDICATIONS AND DRUG USE IN PREGNANCY  Take prenatal vitamins as directed. The vitamin should contain 1 milligram of folic acid. Keep all vitamins out of reach of children. Only a couple vitamins or tablets containing iron may be fatal to a baby or young child when ingested.   Avoid use of all medications, including herbs, over-the-counter medications, not prescribed or suggested by your caregiver. Only take over-the-counter or prescription medicines for pain, discomfort, or fever as directed by your caregiver. Do not use aspirin.   Let your caregiver also know about herbs you may be using.   Alcohol is related to a number of birth defects. This includes fetal alcohol syndrome. All alcohol, in any form, should be avoided completely. Smoking will cause low birth rate and premature babies.   Street or illegal drugs are very harmful to the baby. They are absolutely forbidden. A baby born to an addicted mother will be addicted at birth. The baby will go through the same withdrawal an adult does.  SEEK MEDICAL CARE IF:  You have any concerns or worries during your pregnancy. It is better to call with  your questions if you feel they cannot wait, rather than worry about them. SEEK IMMEDIATE MEDICAL CARE IF:   An unexplained oral temperature above 102 F (38.9 C) develops, or as your caregiver suggests.   You have leaking of fluid from the vagina (birth canal). If leaking membranes are suspected, take your temperature and tell your caregiver of this when you call.   There   is vaginal spotting, bleeding, or passing clots. Tell your caregiver of the amount and how many pads are used. Light spotting in pregnancy is common, especially following intercourse.   You develop a bad smelling vaginal discharge with a change in the color from clear to white.   You continue to feel sick to your stomach (nauseated) and have no relief from remedies suggested. You vomit blood or coffee ground-like materials.   You lose more than 2 pounds of weight or gain more than 2 pounds of weight over 1 week, or as suggested by your caregiver.   You notice swelling of your face, hands, feet, or legs.   You get exposed to German measles and have never had them.   You are exposed to fifth disease or chickenpox.   You develop belly (abdominal) pain. Round ligament discomfort is a common non-cancerous (benign) cause of abdominal pain in pregnancy. Your caregiver still must evaluate you.   You develop a bad headache that does not go away.   You develop fever, diarrhea, pain with urination, or shortness of breath.   You develop visual problems, blurry, or double vision.   You fall or are in a car accident or any kind of trauma.   There is mental or physical violence at home.  Document Released: 03/10/2001 Document Revised: 03/05/2011 Document Reviewed: 09/12/2008 [employer] Patient Information 2012 [employer]. Contraception Choices Contraception (birth control) is the use of any methods or devices to prevent pregnancy. Below are some methods to help avoid pregnancy. HORMONAL METHODS   Contraceptive implant.  This is a thin, plastic tube containing progesterone hormone. It does not contain estrogen hormone. Your caregiver inserts the tube in the inner part of the upper arm. The tube can remain in place for up to 3 years. After 3 years, the implant must be removed. The implant prevents the ovaries from releasing an egg (ovulation), thickens the cervical mucus which prevents sperm from entering the uterus, and thins the lining of the inside of the uterus.   Progesterone-only injections. These injections are given every 3 months by your caregiver to prevent pregnancy. This synthetic progesterone hormone stops the ovaries from releasing eggs. It also thickens cervical mucus and changes the uterine lining. This makes it harder for sperm to survive in the uterus.   Birth control pills. These pills contain estrogen and progesterone hormone. They work by stopping the egg from forming in the ovary (ovulation). Birth control pills are prescribed by a caregiver.Birth control pills can also be used to treat heavy periods.   Minipill. This type of birth control pill contains only the progesterone hormone. They are taken every day of each month and must be prescribed by your caregiver.   Birth control patch. The patch contains hormones similar to those in birth control pills. It must be changed once a week and is prescribed by a caregiver.   Vaginal ring. The ring contains hormones similar to those in birth control pills. It is left in the vagina for 3 weeks, removed for 1 week, and then a new one is put back in place. The patient must be comfortable inserting and removing the ring from the vagina.A caregiver's prescription is necessary.   Emergency contraception. Emergency contraceptives prevent pregnancy after unprotected sexual intercourse. This pill can be taken right after sex or up to 5 days after unprotected sex. It is most effective the sooner you take the pills after having sexual intercourse. Emergency  contraceptive pills are available without a prescription.   Check with your pharmacist. Do not use emergency contraception as your only form of birth control.  BARRIER METHODS   Female condom. This is a thin sheath (latex or rubber) that is worn over the penis during sexual intercourse. It can be used with spermicide to increase effectiveness.   Female condom. This is a soft, loose-fitting sheath that is put into the vagina before sexual intercourse.   Diaphragm. This is a soft, latex, dome-shaped barrier that must be fitted by a caregiver. It is inserted into the vagina, along with a spermicidal jelly. It is inserted before intercourse. The diaphragm should be left in the vagina for 6 to 8 hours after intercourse.   Cervical cap. This is a round, soft, latex or plastic cup that fits over the cervix and must be fitted by a caregiver. The cap can be left in place for up to 48 hours after intercourse.   Sponge. This is a soft, circular piece of polyurethane foam. The sponge has spermicide in it. It is inserted into the vagina after wetting it and before sexual intercourse.   Spermicides. These are chemicals that kill or block sperm from entering the cervix and uterus. They come in the form of creams, jellies, suppositories, foam, or tablets. They do not require a prescription. They are inserted into the vagina with an applicator before having sexual intercourse. The process must be repeated every time you have sexual intercourse.  INTRAUTERINE CONTRACEPTION  Intrauterine device (IUD). This is a T-shaped device that is put in a woman's uterus during a menstrual period to prevent pregnancy. There are 2 types:   Copper IUD. This type of IUD is wrapped in copper wire and is placed inside the uterus. Copper makes the uterus and fallopian tubes produce a fluid that kills sperm. It can stay in place for 10 years.   Hormone IUD. This type of IUD contains the hormone progestin (synthetic progesterone). The  hormone thickens the cervical mucus and prevents sperm from entering the uterus, and it also thins the uterine lining to prevent implantation of a fertilized egg. The hormone can weaken or kill the sperm that get into the uterus. It can stay in place for 5 years.  PERMANENT METHODS OF CONTRACEPTION  Female tubal ligation. This is when the woman's fallopian tubes are surgically sealed, tied, or blocked to prevent the egg from traveling to the uterus.   Female sterilization. This is when the female has the tubes that carry sperm tied off (vasectomy).This blocks sperm from entering the vagina during sexual intercourse. After the procedure, the man can still ejaculate fluid (semen).  NATURAL PLANNING METHODS  Natural family planning. This is not having sexual intercourse or using a barrier method (condom, diaphragm, cervical cap) on days the woman could become pregnant.   Calendar method. This is keeping track of the length of each menstrual cycle and identifying when you are fertile.   Ovulation method. This is avoiding sexual intercourse during ovulation.   Symptothermal method. This is avoiding sexual intercourse during ovulation, using a thermometer and ovulation symptoms.   Post-ovulation method. This is timing sexual intercourse after you have ovulated.  Regardless of which type or method of contraception you choose, it is important that you use condoms to protect against the transmission of sexually transmitted diseases (STDs). Talk with your caregiver about which form of contraception is most appropriate for you. Document Released: 03/16/2005 Document Revised: 03/05/2011 Document Reviewed: 07/23/2010 [employer] Patient Information 2012 [employer]. Breastfeeding BENEFITS OF BREASTFEEDING For   the baby  The first milk (colostrum) helps the baby's digestive system function better.   There are antibodies from the mother in the milk that help the baby fight off infections.   The baby has a  lower incidence of asthma, allergies, and SIDS (sudden infant death syndrome).   The nutrients in breast milk are better than formulas for the baby and helps the baby's brain grow better.   Babies who breastfeed have less gas, colic, and constipation.  For the mother  Breastfeeding helps develop a very special bond between mother and baby.   It is more convenient, always available at the correct temperature and cheaper than formula feeding.   It burns calories in the mother and helps with losing weight that was gained during pregnancy.   It makes the uterus contract back down to normal size faster and slows bleeding following delivery.   Breastfeeding mothers have a lower risk of developing breast cancer.  NURSE FREQUENTLY  A healthy, full-term baby may breastfeed as often as every hour or space his or her feedings to every 3 hours.   How often to nurse will vary from baby to baby. Watch your baby for signs of hunger, not the clock.   Nurse as often as the baby requests, or when you feel the need to reduce the fullness of your breasts.   Awaken the baby if it has been 3 to 4 hours since the last feeding.   Frequent feeding will help the mother make more milk and will prevent problems like sore nipples and engorgement of the breasts.  BABY'S POSITION AT THE BREAST  Whether lying down or sitting, be sure that the baby's tummy is facing your tummy.   Support the breast with 4 fingers underneath the breast and the thumb above. Make sure your fingers are well away from the nipple and baby's mouth.   Stroke the baby's lips and cheek closest to the breast gently with your finger or nipple.   When the baby's mouth is open wide enough, place all of your nipple and as much of the dark area around the nipple as possible into your baby's mouth.   Pull the baby in close so the tip of the nose and the baby's cheeks touch the breast during the feeding.  FEEDINGS  The length of each feeding  varies from baby to baby and from feeding to feeding.   The baby must suck about 2 to 3 minutes for your milk to get to him or her. This is called a "let down." For this reason, allow the baby to feed on each breast as long as he or she wants. Your baby will end the feeding when he or she has received the right balance of nutrients.   To break the suction, put your finger into the corner of the baby's mouth and slide it between his or her gums before removing your breast from his or her mouth. This will help prevent sore nipples.  REDUCING BREAST ENGORGEMENT  In the first week after your baby is born, you may experience signs of breast engorgement. When breasts are engorged, they feel heavy, warm, full, and may be tender to the touch. You can reduce engorgement if you:   Nurse frequently, every 2 to 3 hours. Mothers who breastfeed early and often have fewer problems with engorgement.   Place light ice packs on your breasts between feedings. This reduces swelling. Wrap the ice packs in a lightweight towel to protect   your skin.   Apply moist hot packs to your breast for 5 to 10 minutes before each feeding. This increases circulation and helps the milk flow.   Gently massage your breast before and during the feeding.   Make sure that the baby empties at least one breast at every feeding before switching sides.   Use a breast pump to empty the breasts if your baby is sleepy or not nursing well. You may also want to pump if you are returning to work or or you feel you are getting engorged.   Avoid bottle feeds, pacifiers or supplemental feedings of water or juice in place of breastfeeding.   Be sure the baby is latched on and positioned properly while breastfeeding.   Prevent fatigue, stress, and anemia.   Wear a supportive bra, avoiding underwire styles.   Eat a balanced diet with enough fluids.  If you follow these suggestions, your engorgement should improve in 24 to 48 hours. If you are  still experiencing difficulty, call your lactation consultant or caregiver. IS MY BABY GETTING ENOUGH MILK? Sometimes, mothers worry about whether their babies are getting enough milk. You can be assured that your baby is getting enough milk if:  The baby is actively sucking and you hear swallowing.   The baby nurses at least 8 to 12 times in a 24 hour time period. Nurse your baby until he or she unlatches or falls asleep at the first breast (at least 10 to 20 minutes), then offer the second side.   The baby is wetting 5 to 6 disposable diapers (6 to 8 cloth diapers) in a 24 hour period by 5 to 6 days of age.   The baby is having at least 2 to 3 stools every 24 hours for the first few months. Breast milk is all the food your baby needs. It is not necessary for your baby to have water or formula. In fact, to help your breasts make more milk, it is best not to give your baby supplemental feedings during the early weeks.   The stool should be soft and yellow.   The baby should gain 4 to 7 ounces per week after he is 4 days old.  TAKE CARE OF YOURSELF Take care of your breasts by:  Bathing or showering daily.   Avoiding the use of soaps on your nipples.   Start feedings on your left breast at one feeding and on your right breast at the next feeding.   You will notice an increase in your milk supply 2 to 5 days after delivery. You may feel some discomfort from engorgement, which makes your breasts very firm and often tender. Engorgement "peaks" out within 24 to 48 hours. In the meantime, apply warm moist towels to your breasts for 5 to 10 minutes before feeding. Gentle massage and expression of some milk before feeding will soften your breasts, making it easier for your baby to latch on. Wear a well fitting nursing bra and air dry your nipples for 10 to 15 minutes after each feeding.   Only use cotton bra pads.   Only use pure lanolin on your nipples after nursing. You do not need to wash it  off before nursing.  Take care of yourself by:   Eating well-balanced meals and nutritious snacks.   Drinking milk, fruit juice, and water to satisfy your thirst (about 8 glasses a day).   Getting plenty of rest.   Increasing calcium in your diet (1200 mg   a day).   Avoiding foods that you notice affect the baby in a bad way.  SEEK MEDICAL CARE IF:   You have any questions or difficulty with breastfeeding.   You need help.   You have a hard, red, sore area on your breast, accompanied by a fever of 100.5 F (38.1 C) or more.   Your baby is too sleepy to eat well or is having trouble sleeping.   Your baby is wetting less than 6 diapers per day, by 5 days of age.   Your baby's skin or white part of his or her eyes is more yellow than it was in the hospital.   You feel depressed.  Document Released: 03/16/2005 Document Revised: 03/05/2011 Document Reviewed: 10/29/2008 Exit[employer] Patient Information 2012 [employer]. 

## 2011-08-28 ENCOUNTER — Encounter: Payer: Self-pay | Admitting: Family Medicine

## 2011-08-28 LAB — ALPHA FETOPROTEIN, MATERNAL
Curr Gest Age: 21.1 wks.days
MoM for AFP: 1.56
Open Spina bifida: NEGATIVE
Osb Risk: 1:2380 {titer}

## 2011-09-22 ENCOUNTER — Encounter: Payer: Self-pay | Admitting: Nurse Practitioner

## 2011-09-22 ENCOUNTER — Encounter: Payer: Medicaid Other | Admitting: Nurse Practitioner

## 2011-09-22 ENCOUNTER — Ambulatory Visit (INDEPENDENT_AMBULATORY_CARE_PROVIDER_SITE_OTHER): Payer: Medicaid Other | Admitting: Nurse Practitioner

## 2011-09-22 VITALS — BP 126/72 | HR 96 | Ht 62.0 in | Wt 180.0 lb

## 2011-09-22 DIAGNOSIS — J45909 Unspecified asthma, uncomplicated: Secondary | ICD-10-CM | POA: Insufficient documentation

## 2011-09-22 DIAGNOSIS — G43909 Migraine, unspecified, not intractable, without status migrainosus: Secondary | ICD-10-CM

## 2011-09-22 MED ORDER — ALBUTEROL SULFATE HFA 108 (90 BASE) MCG/ACT IN AERS: 2.0000 | INHALATION_SPRAY | Freq: Four times a day (QID) | RESPIRATORY_TRACT | Status: DC | PRN

## 2011-09-22 MED ORDER — RIZATRIPTAN BENZOATE 10 MG PO TBDP: 10.0000 mg | ORAL_TABLET | ORAL | Status: DC | PRN

## 2011-09-22 NOTE — Progress Notes (Signed)
S: Follow up headaches. Relpax and Maxalt both work.  Finally got coverage for Maxalt and it is doing well. Has had 2-3 migraines since last visit. Came in today for refills on Maxalt MLT only. Baby boy doing well. 25 weeks and  2 days with good movement. Pt requested refill on Albuterol.  O: Alert, oriented NAD. FHT 154  A: Migraine in pregnancy Hx Asthma  P: Refill Maxatl MLT and Albuterol RTC prn

## 2011-09-22 NOTE — Patient Instructions (Addendum)

## 2011-09-23 ENCOUNTER — Other Ambulatory Visit: Payer: Self-pay | Admitting: *Deleted

## 2011-09-23 DIAGNOSIS — G43909 Migraine, unspecified, not intractable, without status migrainosus: Secondary | ICD-10-CM

## 2011-09-23 MED ORDER — RIZATRIPTAN BENZOATE 10 MG PO TBDP: 10.0000 mg | ORAL_TABLET | ORAL | Status: DC | PRN

## 2011-09-23 NOTE — Telephone Encounter (Signed)
Patient would like medication transferred to [facility] pharmacy due to trouble filling meds at [facility].

## 2011-09-28 ENCOUNTER — Ambulatory Visit (INDEPENDENT_AMBULATORY_CARE_PROVIDER_SITE_OTHER): Payer: Medicaid Other | Admitting: Obstetrics & Gynecology

## 2011-09-28 VITALS — BP 118/70 | Wt 184.0 lb

## 2011-09-28 DIAGNOSIS — Z3480 Encounter for supervision of other normal pregnancy, unspecified trimester: Secondary | ICD-10-CM

## 2011-09-28 DIAGNOSIS — Z23 Encounter for immunization: Secondary | ICD-10-CM

## 2011-09-28 LAB — CBC
Hemoglobin: 12 g/dL (ref 12.0–15.0)
MCH: 32.5 pg (ref 26.0–34.0)
MCHC: 35.7 g/dL (ref 30.0–36.0)
MCV: 91.1 fL (ref 78.0–100.0)
RBC: 3.69 MIL/uL — ABNORMAL LOW (ref 3.87–5.11)

## 2011-09-28 NOTE — Progress Notes (Signed)
Medicaid papers for BTS signed today. 1 hr GTT and labs today. No other complaints or concerns.  Fetal movement and labor precautions reviewed.

## 2011-09-28 NOTE — Patient Instructions (Signed)
Pregnancy - Third Trimester The third trimester of pregnancy (the last 3 months) is a period of the most rapid growth for you and your baby. The baby approaches a length of 20 inches and a weight of 6 to 10 pounds. The baby is adding on fat and getting ready for life outside your body. While inside, babies have periods of sleeping and waking, suck their thumbs, and hiccups. You can often feel small contractions of the uterus. This is false labor. It is also called Braxton-Hicks contractions. This is like a practice for labor. The usual problems in this stage of pregnancy include more difficulty breathing, swelling of the hands and feet from water retention, and having to urinate more often because of the uterus and baby pressing on your bladder.  PRENATAL EXAMS  Blood work may continue to be done during prenatal exams. These tests are done to check on your health and the probable health of your baby. Blood work is used to follow your blood levels (hemoglobin). Anemia (low hemoglobin) is common during pregnancy. Iron and vitamins are given to help prevent this. You may also continue to be checked for diabetes. Some of the past blood tests may be done again.   The size of the uterus is measured during each visit. This makes sure your baby is growing properly according to your pregnancy dates.   Your blood pressure is checked every prenatal visit. This is to make sure you are not getting toxemia.   Your urine is checked every prenatal visit for infection, diabetes and protein.   Your weight is checked at each visit. This is done to make sure gains are happening at the suggested rate and that you and your baby are growing normally.   Sometimes, an ultrasound is performed to confirm the position and the proper growth and development of the baby. This is a test done that bounces harmless sound waves off the baby so your caregiver can more accurately determine due dates.   Discuss the type of pain  medication and anesthesia you will have during your labor and delivery.   Discuss the possibility and anesthesia if a Cesarean Section might be necessary.   Inform your caregiver if there is any mental or physical violence at home.  Sometimes, a specialized non-stress test, contraction stress test and biophysical profile are done to make sure the baby is not having a problem. Checking the amniotic fluid surrounding the baby is called an amniocentesis. The amniotic fluid is removed by sticking a needle into the belly (abdomen). This is sometimes done near the end of pregnancy if an early delivery is required. In this case, it is done to help make sure the baby's lungs are mature enough for the baby to live outside of the womb. If the lungs are not mature and it is unsafe to deliver the baby, an injection of cortisone medication is given to the mother 1 to 2 days before the delivery. This helps the baby's lungs mature and makes it safer to deliver the baby. CHANGES OCCURING IN THE THIRD TRIMESTER OF PREGNANCY Your body goes through many changes during pregnancy. They vary from person to person. Talk to your caregiver about changes you notice and are concerned about.  During the last trimester, you have probably had an increase in your appetite. It is normal to have cravings for certain foods. This varies from person to person and pregnancy to pregnancy.   You may begin to get stretch marks on your hips,   abdomen, and breasts. These are normal changes in the body during pregnancy. There are no exercises or medications to take which prevent this change.   Constipation may be treated with a stool softener or adding bulk to your diet. Drinking lots of fluids, fiber in vegetables, fruits, and whole grains are helpful.   Exercising is also helpful. If you have been very active up until your pregnancy, most of these activities can be continued during your pregnancy. If you have been less active, it is helpful  to start an exercise program such as walking. Consult your caregiver before starting exercise programs.   Avoid all smoking, alcohol, un-prescribed drugs, herbs and "street drugs" during your pregnancy. These chemicals affect the formation and growth of the baby. Avoid chemicals throughout the pregnancy to ensure the delivery of a healthy infant.   Backache, varicose veins and hemorrhoids may develop or get worse.   You will tire more easily in the third trimester, which is normal.   The baby's movements may be stronger and more often.   You may become short of breath easily.   Your belly button may stick out.   A yellow discharge may leak from your breasts called colostrum.   You may have a bloody mucus discharge. This usually occurs a few days to a week before labor begins.  HOME CARE INSTRUCTIONS   Keep your caregiver's appointments. Follow your caregiver's instructions regarding medication use, exercise, and diet.   During pregnancy, you are providing food for you and your baby. Continue to eat regular, well-balanced meals. Choose foods such as meat, fish, milk and other low fat dairy products, vegetables, fruits, and whole-grain breads and cereals. Your caregiver will tell you of the ideal weight gain.   A physical sexual relationship may be continued throughout pregnancy if there are no other problems such as early (premature) leaking of amniotic fluid from the membranes, vaginal bleeding, or belly (abdominal) pain.   Exercise regularly if there are no restrictions. Check with your caregiver if you are unsure of the safety of your exercises. Greater weight gain will occur in the last 2 trimesters of pregnancy. Exercising helps:   Control your weight.   Get you in shape for labor and delivery.   You lose weight after you deliver.   Rest a lot with legs elevated, or as needed for leg cramps or low back pain.   Wear a good support or jogging bra for breast tenderness during  pregnancy. This may help if worn during sleep. Pads or tissues may be used in the bra if you are leaking colostrum.   Do not use hot tubs, steam rooms, or saunas.   Wear your seat belt when driving. This protects you and your baby if you are in an accident.   Avoid raw meat, cat litter boxes and soil used by cats. These carry germs that can cause birth defects in the baby.   It is easier to loose urine during pregnancy. Tightening up and strengthening the pelvic muscles will help with this problem. You can practice stopping your urination while you are going to the bathroom. These are the same muscles you need to strengthen. It is also the muscles you would use if you were trying to stop from passing gas. You can practice tightening these muscles up 10 times a set and repeating this about 3 times per day. Once you know what muscles to tighten up, do not perform these exercises during urination. It is more likely   to cause an infection by backing up the urine.   Ask for help if you have financial, counseling or nutritional needs during pregnancy. Your caregiver will be able to offer counseling for these needs as well as refer you for other special needs.   Make a list of emergency phone numbers and have them available.   Plan on getting help from family or friends when you go home from the hospital.   Make a trial run to the hospital.   Take prenatal classes with the father to understand, practice and ask questions about the labor and delivery.   Prepare the baby's room/nursery.   Do not travel out of the city unless it is absolutely necessary and with the advice of your caregiver.   Wear only low or no heal shoes to have better balance and prevent falling.  MEDICATIONS AND DRUG USE IN PREGNANCY  Take prenatal vitamins as directed. The vitamin should contain 1 milligram of folic acid. Keep all vitamins out of reach of children. Only a couple vitamins or tablets containing iron may be fatal  to a baby or young child when ingested.   Avoid use of all medications, including herbs, over-the-counter medications, not prescribed or suggested by your caregiver. Only take over-the-counter or prescription medicines for pain, discomfort, or fever as directed by your caregiver. Do not use aspirin, ibuprofen (Motrin, Advil, Nuprin) or naproxen (Aleve) unless OK'd by your caregiver.   Let your caregiver also know about herbs you may be using.   Alcohol is related to a number of birth defects. This includes fetal alcohol syndrome. All alcohol, in any form, should be avoided completely. Smoking will cause low birth rate and premature babies.   Street/illegal drugs are very harmful to the baby. They are absolutely forbidden. A baby born to an addicted mother will be addicted at birth. The baby will go through the same withdrawal an adult does.  SEEK MEDICAL CARE IF: You have any concerns or worries during your pregnancy. It is better to call with your questions if you feel they cannot wait, rather than worry about them. DECISIONS ABOUT CIRCUMCISION You may or may not know the sex of your baby. If you know your baby is a boy, it may be time to think about circumcision. Circumcision is the removal of the foreskin of the penis. This is the skin that covers the sensitive end of the penis. There is no proven medical need for this. Often this decision is made on what is popular at the time or based upon religious beliefs and social issues. You can discuss these issues with your caregiver or pediatrician. SEEK IMMEDIATE MEDICAL CARE IF:   An unexplained oral temperature above 102 F (38.9 C) develops, or as your caregiver suggests.   You have leaking of fluid from the vagina (birth canal). If leaking membranes are suspected, take your temperature and tell your caregiver of this when you call.   There is vaginal spotting, bleeding or passing clots. Tell your caregiver of the amount and how many pads are  used.   You develop a bad smelling vaginal discharge with a change in the color from clear to white.   You develop vomiting that lasts more than 24 hours.   You develop chills or fever.   You develop shortness of breath.   You develop burning on urination.   You loose more than 2 pounds of weight or gain more than 2 pounds of weight or as suggested by your   caregiver.   You notice sudden swelling of your face, hands, and feet or legs.   You develop belly (abdominal) pain. Round ligament discomfort is a common non-cancerous (benign) cause of abdominal pain in pregnancy. Your caregiver still must evaluate you.   You develop a severe headache that does not go away.   You develop visual problems, blurred or double vision.   If you have not felt your baby move for more than 1 hour. If you think the baby is not moving as much as usual, eat something with sugar in it and lie down on your left side for an hour. The baby should move at least 4 to 5 times per hour. Call right away if your baby moves less than that.   You fall, are in a car accident or any kind of trauma.   There is mental or physical violence at home.  Document Released: 03/10/2001 Document Revised: 03/05/2011 Document Reviewed: 09/12/2008 [employer] Patient Information 2012 [employer]. 

## 2011-09-29 ENCOUNTER — Encounter: Payer: Self-pay | Admitting: Obstetrics & Gynecology

## 2011-09-29 LAB — GLUCOSE TOLERANCE, 1 HOUR (50G) W/O FASTING: Glucose, 1 Hour GTT: 94 mg/dL (ref 70–140)

## 2011-10-12 DIAGNOSIS — Z3480 Encounter for supervision of other normal pregnancy, unspecified trimester: Secondary | ICD-10-CM

## 2011-10-19 ENCOUNTER — Ambulatory Visit (INDEPENDENT_AMBULATORY_CARE_PROVIDER_SITE_OTHER): Payer: Medicaid Other | Admitting: Obstetrics & Gynecology

## 2011-10-19 ENCOUNTER — Encounter: Payer: Self-pay | Admitting: Obstetrics & Gynecology

## 2011-10-19 DIAGNOSIS — Z3480 Encounter for supervision of other normal pregnancy, unspecified trimester: Secondary | ICD-10-CM

## 2011-10-19 NOTE — Progress Notes (Signed)
Routine visit. Good FM. No problems.  

## 2011-11-06 ENCOUNTER — Encounter: Payer: Self-pay | Admitting: Obstetrics & Gynecology

## 2011-11-06 ENCOUNTER — Ambulatory Visit (INDEPENDENT_AMBULATORY_CARE_PROVIDER_SITE_OTHER): Payer: Medicaid Other | Admitting: Obstetrics & Gynecology

## 2011-11-06 VITALS — BP 109/73 | Wt 187.0 lb

## 2011-11-06 DIAGNOSIS — Z3480 Encounter for supervision of other normal pregnancy, unspecified trimester: Secondary | ICD-10-CM

## 2011-11-06 DIAGNOSIS — O09299 Supervision of pregnancy with other poor reproductive or obstetric history, unspecified trimester: Secondary | ICD-10-CM

## 2011-11-06 MED ORDER — LANSOPRAZOLE 30 MG PO CPDR: 30.0000 mg | DELAYED_RELEASE_CAPSULE | Freq: Every day | ORAL | Status: DC

## 2011-11-06 NOTE — Progress Notes (Signed)
Routine visit. Good FM. No VB, ROM, CTXs. She does c/o heartburn. I will change her from prilosec to prevacid.

## 2011-11-17 ENCOUNTER — Telehealth: Payer: Self-pay | Admitting: *Deleted

## 2011-11-17 NOTE — Telephone Encounter (Signed)
OK to call in something

## 2011-11-17 NOTE — Telephone Encounter (Signed)
Patient is wanting to know if we can call in some ambien to help her sleep.  Baby is kicking her in her ribs a lot and it is making it very uncomfortable for her to sleep, she is tossing and turning all night.

## 2011-11-18 ENCOUNTER — Encounter: Payer: Medicaid Other | Admitting: Family Medicine

## 2011-11-25 ENCOUNTER — Ambulatory Visit (INDEPENDENT_AMBULATORY_CARE_PROVIDER_SITE_OTHER): Payer: Medicaid Other | Admitting: Obstetrics & Gynecology

## 2011-11-25 VITALS — BP 97/70 | Wt 188.0 lb

## 2011-11-25 DIAGNOSIS — Z3480 Encounter for supervision of other normal pregnancy, unspecified trimester: Secondary | ICD-10-CM

## 2011-11-25 DIAGNOSIS — K219 Gastro-esophageal reflux disease without esophagitis: Secondary | ICD-10-CM | POA: Insufficient documentation

## 2011-11-25 MED ORDER — PANTOPRAZOLE SODIUM 40 MG PO TBEC: 40.0000 mg | DELAYED_RELEASE_TABLET | Freq: Every day | ORAL | Status: DC

## 2011-11-25 NOTE — Progress Notes (Signed)
Could not fill Rx for Prevacid but got some OTC. Rx for Protonix given.            Baby moving but less active. NST today

## 2011-11-25 NOTE — Patient Instructions (Signed)
Fetal Monitoring, Fetal Movement Assessment Fetal movement assessment (FMA) is done by the pregnant woman herself by counting and recording the baby's movements over a certain time period. It is done to see if there are problems with the pregnancy and the baby. Identifying and correcting problems may prevent serious problems from developing with the fetus, including fetal loss. Some pregnancies are complicated by the mother's medical problems. Some of these problems are type 1 diabetes mellitus, high blood pressure and other chronic medical illnesses. This is why it is important to monitor the baby before birth.  OTHER TECHNIQUES OF MONITORING YOUR BABY BEFORE BIRTH Several tests are in use. These include:  Nonstress test (NST). This test monitors the baby's heart rate when the baby moves.   Contraction stress test (CST). This test monitors the baby's heart rate during a contraction of the uterus.   Fetal biophysical profile (BPP). This measures and evaluates 5 observations of the baby:   The nonstress test.   The baby's breathing.   The baby's movements.  Fetal Movement Counts Patient Name: __________________________________________________ Patient Due Date: ____________________ Melody Haver counts is highly recommended in high risk pregnancies, but it is a good idea for every pregnant woman to do. Start counting fetal movements at 28 weeks of the pregnancy. Fetal movements increase after eating a full meal or eating or drinking something sweet (the blood sugar is higher). It is also important to drink plenty of fluids (well hydrated) before doing the count. Lie on your left side because it helps with the circulation or you can sit in a comfortable chair with your arms over your belly (abdomen) with no distractions around you. DOING THE COUNT  Try to do the count the same time of day each time you do it.   Mark the day and time, then see how long it takes for you to feel 10 movements (kicks,  flutters, swishes, rolls). You should have at least 10 movements within 2 hours. You will most likely feel 10 movements in much less than 2 hours. If you do not, wait an hour and count again. After a couple of days you will see a pattern.   What you are looking for is a change in the pattern or not enough counts in 2 hours. Is it taking longer in time to reach 10 movements?  SEEK MEDICAL CARE IF:  You feel less than 10 counts in 2 hours. Tried twice.   No movement in one hour.   The pattern is changing or taking longer each day to reach 10 counts in 2 hours.   You feel the baby is not moving as it usually does.  Date: ____________ Movements: ____________ Start time: ____________ Doreatha Martin time: ____________  Date: ____________ Movements: ____________ Start time: ____________ Doreatha Martin time: ____________ Date: ____________ Movements: ____________ Start time: ____________ Doreatha Martin time: ____________ Date: ____________ Movements: ____________ Start time: ____________ Doreatha Martin time: ____________ Date: ____________ Movements: ____________ Start time: ____________ Doreatha Martin time: ____________ Date: ____________ Movements: ____________ Start time: ____________ Doreatha Martin time: ____________ Date: ____________ Movements: ____________ Start time: ____________ Doreatha Martin time: ____________ Date: ____________ Movements: ____________ Start time: ____________ Doreatha Martin time: ____________  Date: ____________ Movements: ____________ Start time: ____________ Doreatha Martin time: ____________ Date: ____________ Movements: ____________ Start time: ____________ Doreatha Martin time: ____________ Date: ____________ Movements: ____________ Start time: ____________ Doreatha Martin time: ____________ Date: ____________ Movements: ____________ Start time: ____________ Doreatha Martin time: ____________ Date: ____________ Movements: ____________ Start time: ____________ Doreatha Martin time: ____________ Date: ____________ Movements: ____________ Start time: ____________ Doreatha Martin time:  ____________ Date:  ____________ Movements: ____________ Start time: ____________ Doreatha Martin time: ____________  Date: ____________ Movements: ____________ Start time: ____________ Doreatha Martin time: ____________ Date: ____________ Movements: ____________ Start time: ____________ Doreatha Martin time: ____________ Date: ____________ Movements: ____________ Start time: ____________ Doreatha Martin time: ____________ Date: ____________ Movements: ____________ Start time: ____________ Doreatha Martin time: ____________ Date: ____________ Movements: ____________ Start time: ____________ Doreatha Martin time: ____________ Date: ____________ Movements: ____________ Start time: ____________ Doreatha Martin time: ____________ Date: ____________ Movements: ____________ Start time: ____________ Doreatha Martin time: ____________  Date: ____________ Movements: ____________ Start time: ____________ Doreatha Martin time: ____________ Date: ____________ Movements: ____________ Start time: ____________ Doreatha Martin time: ____________ Date: ____________ Movements: ____________ Start time: ____________ Doreatha Martin time: ____________ Date: ____________ Movements: ____________ Start time: ____________ Doreatha Martin time: ____________ Date: ____________ Movements: ____________ Start time: ____________ Doreatha Martin time: ____________ Date: ____________ Movements: ____________ Start time: ____________ Doreatha Martin time: ____________ Date: ____________ Movements: ____________ Start time: ____________ Doreatha Martin time: ____________  Date: ____________ Movements: ____________ Start time: ____________ Doreatha Martin time: ____________ Date: ____________ Movements: ____________ Start time: ____________ Doreatha Martin time: ____________ Date: ____________ Movements: ____________ Start time: ____________ Doreatha Martin time: ____________ Date: ____________ Movements: ____________ Start time: ____________ Doreatha Martin time: ____________ Date: ____________ Movements: ____________ Start time: ____________ Doreatha Martin time: ____________ Date: ____________ Movements:  ____________ Start time: ____________ Doreatha Martin time: ____________ Date: ____________ Movements: ____________ Start time: ____________ Doreatha Martin time: ____________  Date: ____________ Movements: ____________ Start time: ____________ Doreatha Martin time: ____________ Date: ____________ Movements: ____________ Start time: ____________ Doreatha Martin time: ____________ Date: ____________ Movements: ____________ Start time: ____________ Doreatha Martin time: ____________ Date: ____________ Movements: ____________ Start time: ____________ Doreatha Martin time: ____________ Date: ____________ Movements: ____________ Start time: ____________ Doreatha Martin time: ____________ Date: ____________ Movements: ____________ Start time: ____________ Doreatha Martin time: ____________ Date: ____________ Movements: ____________ Start time: ____________ Doreatha Martin time: ____________  Date: ____________ Movements: ____________ Start time: ____________ Doreatha Martin time: ____________ Date: ____________ Movements: ____________ Start time: ____________ Doreatha Martin time: ____________ Date: ____________ Movements: ____________ Start time: ____________ Doreatha Martin time: ____________ Date: ____________ Movements: ____________ Start time: ____________ Doreatha Martin time: ____________ Date: ____________ Movements: ____________ Start time: ____________ Doreatha Martin time: ____________ Date: ____________ Movements: ____________ Start time: ____________ Doreatha Martin time: ____________ Date: ____________ Movements: ____________ Start time: ____________ Doreatha Martin time: ____________  Date: ____________ Movements: ____________ Start time: ____________ Doreatha Martin time: ____________ Date: ____________ Movements: ____________ Start time: ____________ Doreatha Martin time: ____________ Date: ____________ Movements: ____________ Start time: ____________ Doreatha Martin time: ____________ Date: ____________ Movements: ____________ Start time: ____________ Doreatha Martin time: ____________ Date: ____________ Movements: ____________ Start time: ____________ Doreatha Martin  time: ____________ Date: ____________ Movements: ____________ Start time: ____________ Doreatha Martin time: ____________ Document Released: 04/15/2006 Document Revised: 03/05/2011 Document Reviewed: 10/16/2008  [employer] Patient Information 2012 [employer].The baby's muscle tone.   The amount of amniotic fluid.   Modified BPP. This measures the volume of fluid in different parts of the amniotic sac (amniotic fluid index) and the results of the nonstress test.   Umbilical artery doppler velocimetry. This evaluates the blood flow through the umbilical cord.  There are several very serious problems that cannot be predicted or detected with any of the fetal monitoring procedures. These problems include separation (abruption) of the placenta or when the fetus chokes on the umbilical cord (umbilical cord accident). Your caregiver will help you understand your tests and what they mean for you and your baby. It is your responsibility to obtain the results of your test. LET YOUR CAREGIVER KNOW ABOUT:   Any medications you are taking including prescription and over-the-counter drugs, herbs, eye drops and creams.   If you have a fever.   If you have an infection.  If you are sick.  RISKS AND COMPLICATIONS  There are no risks or complications to the mother or fetus with FMA. BEFORE THE PROCEDURE  Do not take medications that may decrease or increase the baby's heart rate and/or movements.   Eat a full meal at least 2 hours before the test.   Do not smoke if you are pregnant. If you smoke, stop at least 2 days before the test. It is best not to smoke at all when you are pregnant.  PROCEDURE Sometimes, a mother notices her baby moves less before there are problems. Because of this, it is believed that fetal movement checking by the mother (kick counts) is a good way to check the baby before birth. There are different ways of doing this. Two good ways are:  The woman lies on her side and counts  distinct (individual) fetal movements. A feeling of 10 distinct movements in a period of up to 2 hours is considered reassuring. When 10 movements are felt, you may stop counting.   Women are instructed to count fetal movements for 1 hour, three times per week. The count is good if, after one week, it equals or is over the woman's previously established baseline count. If the count is lower, further checking of your baby is needed.  AFTER THE PROCEDURE You may resume your usual activities. HOME CARE INSTRUCTIONS   Follow your caregiver's advice and recommendations.   Be aware of your baby's movements. Are they normal, less than usual or more than usual?   Make and keep the rest of your prenatal appointments.  SEEK MEDICAL CARE IF:   You develop a temperature of 100 F (37.8 C) or higher.   You have a bloody mucus discharge from the vagina (a bloody show).  SEEK IMMEDIATE MEDICAL CARE IF:   You do not feel the baby move.   You think the baby's movements are too little or too many.   You develop contractions.   You develop vaginal bleeding.   You have belly (abdominal) pain.   You have leaking or a gush of fluid from the vagina.  Document Released: 03/06/2002 Document Revised: 03/05/2011 Document Reviewed: 07/09/2008 [facility] Patient Information 2012 [place], [place].

## 2011-12-01 ENCOUNTER — Encounter: Payer: Self-pay | Admitting: Obstetrics & Gynecology

## 2011-12-01 ENCOUNTER — Ambulatory Visit (INDEPENDENT_AMBULATORY_CARE_PROVIDER_SITE_OTHER): Payer: Medicaid Other | Admitting: Obstetrics & Gynecology

## 2011-12-01 VITALS — BP 100/72 | Wt 188.0 lb

## 2011-12-01 DIAGNOSIS — Z3480 Encounter for supervision of other normal pregnancy, unspecified trimester: Secondary | ICD-10-CM

## 2011-12-01 MED ORDER — PROMETHAZINE HCL 25 MG PO TABS: 25.0000 mg | ORAL_TABLET | Freq: Four times a day (QID) | ORAL | Status: DC | PRN

## 2011-12-01 NOTE — Progress Notes (Signed)
Routine visit. Baby has returned to its usual state of movement. No problems. Labor precautions. Cervical cultures at [place]

## 2011-12-01 NOTE — Addendum Note (Signed)
Addended by: Barbara Cower on: 12/01/2011 10:01 AM   Modules accepted: Orders

## 2011-12-10 ENCOUNTER — Ambulatory Visit (INDEPENDENT_AMBULATORY_CARE_PROVIDER_SITE_OTHER): Payer: Medicaid Other | Admitting: Obstetrics & Gynecology

## 2011-12-10 ENCOUNTER — Encounter: Payer: Self-pay | Admitting: Obstetrics & Gynecology

## 2011-12-10 ENCOUNTER — Other Ambulatory Visit: Payer: Self-pay | Admitting: Obstetrics & Gynecology

## 2011-12-10 VITALS — BP 102/75 | Wt 191.0 lb

## 2011-12-10 DIAGNOSIS — Z23 Encounter for immunization: Secondary | ICD-10-CM

## 2011-12-10 DIAGNOSIS — O09299 Supervision of pregnancy with other poor reproductive or obstetric history, unspecified trimester: Secondary | ICD-10-CM

## 2011-12-10 DIAGNOSIS — Z3480 Encounter for supervision of other normal pregnancy, unspecified trimester: Secondary | ICD-10-CM

## 2011-12-10 LAB — OB RESULTS CONSOLE GBS: GBS: NEGATIVE

## 2011-12-13 LAB — CULTURE, BETA STREP (GROUP B ONLY)

## 2011-12-17 ENCOUNTER — Ambulatory Visit (INDEPENDENT_AMBULATORY_CARE_PROVIDER_SITE_OTHER): Payer: Medicaid Other | Admitting: Obstetrics & Gynecology

## 2011-12-17 ENCOUNTER — Telehealth (HOSPITAL_COMMUNITY): Payer: Self-pay | Admitting: *Deleted

## 2011-12-17 ENCOUNTER — Encounter: Payer: Self-pay | Admitting: Obstetrics & Gynecology

## 2011-12-17 VITALS — BP 113/69 | Wt 191.0 lb

## 2011-12-17 DIAGNOSIS — Z3480 Encounter for supervision of other normal pregnancy, unspecified trimester: Secondary | ICD-10-CM

## 2011-12-17 DIAGNOSIS — O09299 Supervision of pregnancy with other poor reproductive or obstetric history, unspecified trimester: Secondary | ICD-10-CM

## 2011-12-17 NOTE — Progress Notes (Signed)
Routine visit. Cervical cultures/GBS negative from last week. Good FM. Denies OB problems. Labor precautions reviewed.

## 2011-12-18 ENCOUNTER — Encounter (HOSPITAL_COMMUNITY): Payer: Self-pay | Admitting: *Deleted

## 2011-12-18 NOTE — Telephone Encounter (Signed)
Preadmission screen  

## 2011-12-23 ENCOUNTER — Ambulatory Visit (INDEPENDENT_AMBULATORY_CARE_PROVIDER_SITE_OTHER): Payer: Medicaid Other | Admitting: Obstetrics & Gynecology

## 2011-12-23 ENCOUNTER — Encounter: Payer: Self-pay | Admitting: Obstetrics & Gynecology

## 2011-12-23 VITALS — BP 127/86 | Wt 190.0 lb

## 2011-12-23 DIAGNOSIS — Z3480 Encounter for supervision of other normal pregnancy, unspecified trimester: Secondary | ICD-10-CM

## 2011-12-23 DIAGNOSIS — O09299 Supervision of pregnancy with other poor reproductive or obstetric history, unspecified trimester: Secondary | ICD-10-CM

## 2011-12-23 NOTE — Progress Notes (Signed)
Routine visit. Good FM. She says that baby hiccups regularly, but over the last week she has felt him "shake". It happened about 3 times last week. I will do a NST. Reassurance given. Labor precautions. Membranes stripped per patient request.

## 2011-12-29 ENCOUNTER — Ambulatory Visit (INDEPENDENT_AMBULATORY_CARE_PROVIDER_SITE_OTHER): Payer: Medicaid Other | Admitting: Obstetrics & Gynecology

## 2011-12-29 VITALS — BP 120/77 | Wt 192.0 lb

## 2011-12-29 DIAGNOSIS — Z3480 Encounter for supervision of other normal pregnancy, unspecified trimester: Secondary | ICD-10-CM

## 2011-12-29 NOTE — Progress Notes (Signed)
Routine visit. Labor precautions reviewed. Membranes stripped. Good FM. No OB problems

## 2011-12-31 ENCOUNTER — Encounter (HOSPITAL_COMMUNITY): Payer: Self-pay

## 2011-12-31 ENCOUNTER — Encounter (HOSPITAL_COMMUNITY): Payer: Self-pay | Admitting: Anesthesiology

## 2011-12-31 ENCOUNTER — Inpatient Hospital Stay (HOSPITAL_COMMUNITY)
Admission: AD | Admit: 2011-12-31 | Discharge: 2012-01-02 | DRG: 767 | Disposition: A | Discharge status: Home / Self Care | Payer: Medicaid Other | Source: Ambulatory Visit | Attending: Obstetrics & Gynecology | Admitting: Obstetrics & Gynecology

## 2011-12-31 ENCOUNTER — Inpatient Hospital Stay (HOSPITAL_COMMUNITY): Payer: Medicaid Other | Admitting: Anesthesiology

## 2011-12-31 DIAGNOSIS — Z302 Encounter for sterilization: Secondary | ICD-10-CM

## 2011-12-31 DIAGNOSIS — K649 Unspecified hemorrhoids: Secondary | ICD-10-CM | POA: Diagnosis present

## 2011-12-31 DIAGNOSIS — O878 Other venous complications in the puerperium: Secondary | ICD-10-CM | POA: Diagnosis present

## 2011-12-31 HISTORY — DX: Gestational (pregnancy-induced) hypertension without significant proteinuria, unspecified trimester: O13.9

## 2011-12-31 LAB — CBC
HCT: 34 % — ABNORMAL LOW (ref 36.0–46.0)
MCH: 28.4 pg (ref 26.0–34.0)
MCV: 86.3 fL (ref 78.0–100.0)
Platelets: 196 10*3/uL (ref 150–400)
RBC: 3.94 MIL/uL (ref 3.87–5.11)
RDW: 13.5 % (ref 11.5–15.5)

## 2011-12-31 MED ORDER — PHENYLEPHRINE 40 MCG/ML (10ML) SYRINGE FOR IV PUSH (FOR BLOOD PRESSURE SUPPORT): 80.0000 ug | PREFILLED_SYRINGE | INTRAVENOUS | Status: DC | PRN

## 2011-12-31 MED ORDER — OXYTOCIN BOLUS FROM INFUSION
500.0000 mL | Freq: Once | INTRAVENOUS | Status: AC
  Administered 2012-01-01: 500 mL via INTRAVENOUS
  Filled 2011-12-31: qty 500

## 2011-12-31 MED ORDER — LIDOCAINE HCL (PF) 1 % IJ SOLN
30.0000 mL | INTRAMUSCULAR | Status: DC | PRN
  Filled 2011-12-31: qty 30

## 2011-12-31 MED ORDER — PHENYLEPHRINE 40 MCG/ML (10ML) SYRINGE FOR IV PUSH (FOR BLOOD PRESSURE SUPPORT)
80.0000 ug | PREFILLED_SYRINGE | INTRAVENOUS | Status: DC | PRN
  Filled 2011-12-31: qty 5

## 2011-12-31 MED ORDER — LACTATED RINGERS IV SOLN: 500.0000 mL | INTRAVENOUS | Status: DC | PRN

## 2011-12-31 MED ORDER — ONDANSETRON HCL 4 MG/2ML IJ SOLN: 4.0000 mg | Freq: Four times a day (QID) | INTRAMUSCULAR | Status: DC | PRN

## 2011-12-31 MED ORDER — LACTATED RINGERS IV SOLN
500.0000 mL | Freq: Once | INTRAVENOUS | Status: AC
  Administered 2011-12-31: 500 mL via INTRAVENOUS

## 2011-12-31 MED ORDER — EPHEDRINE 5 MG/ML INJ
10.0000 mg | INTRAVENOUS | Status: DC | PRN
  Filled 2011-12-31: qty 4

## 2011-12-31 MED ORDER — IBUPROFEN 600 MG PO TABS: 600.0000 mg | ORAL_TABLET | Freq: Four times a day (QID) | ORAL | Status: DC | PRN

## 2011-12-31 MED ORDER — FENTANYL 2.5 MCG/ML BUPIVACAINE 1/10 % EPIDURAL INFUSION (WH - ANES)
14.0000 mL/h | INTRAMUSCULAR | Status: DC
  Filled 2011-12-31: qty 123

## 2011-12-31 MED ORDER — ACETAMINOPHEN 325 MG PO TABS
650.0000 mg | ORAL_TABLET | ORAL | Status: DC | PRN
  Administered 2012-01-01: 650 mg via ORAL
  Filled 2011-12-31: qty 2

## 2011-12-31 MED ORDER — CITRIC ACID-SODIUM CITRATE 334-500 MG/5ML PO SOLN: 30.0000 mL | ORAL | Status: DC | PRN

## 2011-12-31 MED ORDER — TERBUTALINE SULFATE 1 MG/ML IJ SOLN: 0.2500 mg | Freq: Once | INTRAMUSCULAR | Status: AC | PRN

## 2011-12-31 MED ORDER — OXYTOCIN 40 UNITS IN LACTATED RINGERS INFUSION - SIMPLE MED
1.0000 m[IU]/min | INTRAVENOUS | Status: DC
  Administered 2011-12-31: 2 m[IU]/min via INTRAVENOUS
  Filled 2011-12-31: qty 1000

## 2011-12-31 MED ORDER — LACTATED RINGERS IV SOLN
INTRAVENOUS | Status: DC
  Administered 2011-12-31 – 2012-01-01 (×2): via INTRAVENOUS

## 2011-12-31 MED ORDER — EPHEDRINE 5 MG/ML INJ: 10.0000 mg | INTRAVENOUS | Status: DC | PRN

## 2011-12-31 MED ORDER — OXYTOCIN 40 UNITS IN LACTATED RINGERS INFUSION - SIMPLE MED: 62.5000 mL/h | Freq: Once | INTRAVENOUS | Status: DC

## 2011-12-31 MED ORDER — OXYCODONE-ACETAMINOPHEN 5-325 MG PO TABS: 1.0000 | ORAL_TABLET | ORAL | Status: DC | PRN

## 2011-12-31 MED ORDER — DIPHENHYDRAMINE HCL 50 MG/ML IJ SOLN: 12.5000 mg | INTRAMUSCULAR | Status: DC | PRN

## 2011-12-31 NOTE — MAU Provider Note (Signed)
  History   THIS NOTE CREATED IN ERROR   >>>> WRONG PATIENT  CSN: [id number]  Arrival date and time: 12/31/11 1929   None     Chief Complaint  Patient presents with  . Labor Eval   HPI  OB History    Grav Para Term Preterm Abortions TAB SAB Ect Mult Living   3 2 2  0 0 0 0 0 0 2      Past Medical History  Diagnosis Date  . Headache   . Pregnancy induced hypertension     Past Surgical History  Procedure Date  . Tonsillectomy 2005  . Wisdom tooth extraction     Family History  Problem Relation Age of Onset  . Arthritis Mother   . Thyroid disease Maternal Grandmother   . Lupus Maternal Grandmother     History  Substance Use Topics  . Smoking status: Never Smoker   . Smokeless tobacco: Never Used  . Alcohol Use: No    Allergies: No Known Allergies  Prescriptions prior to admission  Medication Sig Dispense Refill  . albuterol (PROVENTIL HFA;VENTOLIN HFA) 108 (90 BASE) MCG/ACT inhaler Inhale 2 puffs into the lungs every 6 (six) hours as needed for wheezing.  1 Inhaler  2  . ondansetron (ZOFRAN-ODT) 4 MG disintegrating tablet Take 4 mg by mouth every 8 (eight) hours as needed. For nausea      . pantoprazole (PROTONIX) 40 MG tablet Take 1 tablet (40 mg total) by mouth daily.  30 tablet  3  . Pediatric Multivit-Minerals-C (CHILDRENS GUMMIES PO) Take 2 tablets by mouth every morning.       . rizatriptan (MAXALT-MLT) 10 MG disintegrating tablet Take 1 tablet (10 mg total) by mouth as needed for migraine. May repeat in 2 hours if needed  12 tablet  11  . promethazine (PHENERGAN) 25 MG tablet Take 1 tablet (25 mg total) by mouth every 6 (six) hours as needed for nausea.  30 tablet  1  . promethazine (PHENERGAN) 25 MG tablet Take 1 tablet (25 mg total) by mouth every 6 (six) hours as needed for nausea.  30 tablet  1    ROS .  Physical Exam   Blood pressure 127/71, pulse 96, temperature 98.1 F (36.7 C), temperature source Oral, resp. rate 20, height 5\' 2"  (1.575 m),  weight 187 lb (84.823 kg), last menstrual period 02/21/2011, SpO2 100.00%.  Physical Exam  Constitutional: She is oriented to person, place, and time. She appears well-developed and well-nourished. No distress.  Cardiovascular: Normal rate.   Respiratory: Effort normal.  GI: Soft. She exhibits no distension. There is no tenderness. There is no rebound and no guarding.  Genitourinary: Uterus normal. Vaginal discharge (scant white) found.       Cervix long and closed   Musculoskeletal: Normal range of motion.  Neurological: She is alert and oriented to person, place, and time.  Skin: Skin is warm and dry.  Psychiatric: She has a normal mood and affect.    MAU Course  Procedures    Assessment and Plan     [facility] 12/31/2011, 10:00 PM

## 2011-12-31 NOTE — MAU Note (Signed)
Pt reports contractions all day but q 4 minutes x 3 hours. Denies ROM, some spotting.

## 2011-12-31 NOTE — Anesthesia Preprocedure Evaluation (Signed)
Anesthesia Evaluation  Patient identified by MRN, date of birth, ID band Patient awake    Reviewed: Allergy & Precautions, H&P , Patient's Chart, lab work & pertinent test results  Airway Mallampati: III TM Distance: >3 FB Neck ROM: full    Dental No notable dental hx. (+) Teeth Intact   Pulmonary asthma ,  breath sounds clear to auscultation  Pulmonary exam normal       Cardiovascular negative cardio ROS  Rhythm:regular Rate:Normal     Neuro/Psych  Headaches, negative psych ROS   GI/Hepatic Neg liver ROS, GERD-  Medicated and Controlled,  Endo/Other  negative endocrine ROS  Renal/GU negative Renal ROS  negative genitourinary   Musculoskeletal   Abdominal Normal abdominal exam  (+)   Peds  Hematology negative hematology ROS (+)   Anesthesia Other Findings   Reproductive/Obstetrics (+) Pregnancy                           Anesthesia Physical  Anesthesia Plan  ASA: II  Anesthesia Plan: Epidural   Post-op Pain Management:    Induction:   Airway Management Planned:   Additional Equipment:   Intra-op Plan:   Post-operative Plan:   Informed Consent: I have reviewed the patients History and Physical, chart, labs and discussed the procedure including the risks, benefits and alternatives for the proposed anesthesia with the patient or authorized representative who has indicated his/her understanding and acceptance.     Plan Discussed with: Anesthesiologist  Anesthesia Plan Comments:         Anesthesia Quick Evaluation  

## 2011-12-31 NOTE — H&P (Signed)
Tina Holder is a 26 y.o. female presenting for labor. Maternal Medical History:  Reason for admission: Reason for admission: contractions.  Contractions: Onset was 1-2 hours ago.   Frequency: regular.    Fetal activity: Perceived fetal activity is normal.   Last perceived fetal movement was within the past hour.      OB History    Grav Para Term Preterm Abortions TAB SAB Ect Mult Living   3 2 2  0 0 0 0 0 0 2     Past Medical History  Diagnosis Date  . Headache   . Pregnancy induced hypertension    Past Surgical History  Procedure Date  . Tonsillectomy 2005  . Wisdom tooth extraction    Family History: family history includes Arthritis in her mother; Lupus in her maternal grandmother; and Thyroid disease in her maternal grandmother. Social History:  reports that she has never smoked. She has never used smokeless tobacco. She reports that she does not drink alcohol or use illicit drugs.   Prenatal Transfer Tool  Maternal Diabetes: No Genetic Screening: Normal Maternal Ultrasounds/Referrals: Normal Fetal Ultrasounds or other Referrals:  None Maternal Substance Abuse:  No Significant Maternal Medications:  None Significant Maternal Lab Results:  None Other Comments:  None  Review of Systems  Constitutional: Negative.   Genitourinary: Negative.     Dilation: 2 Effacement (%): 80 Station: -2 Exam by:: Artelia Laroche CNM Blood pressure 127/71, pulse 96, temperature 98.1 F (36.7 C), temperature source Oral, resp. rate 20, height 5\' 2"  (1.575 m), weight 84.823 kg (187 lb), last menstrual period 02/21/2011, SpO2 100.00%. Maternal Exam:  Uterine Assessment: Contraction strength is moderate.  Abdomen: Fundal height is 38.   Estimated fetal weight is 7.   Fetal presentation: vertex  Introitus: Normal vulva. Normal vagina.  Ferning test: not done.  Nitrazine test: not done. Amniotic fluid character: not assessed.  Pelvis: adequate for delivery.   Cervix: Cervix  evaluated by digital exam.     Fetal Exam Fetal Monitor Review: Mode: ultrasound.   Baseline rate: 135.  Variability: moderate (6-25 bpm).   Pattern: accelerations present.    Fetal State Assessment: Category I - tracings are normal.     Physical Exam  Constitutional: She appears well-developed and well-nourished. No distress.  HENT:  Head: Normocephalic.  Cardiovascular: Normal rate.   Respiratory: Effort normal.  GI: Soft. There is no tenderness.  Genitourinary: Vagina normal and uterus normal.       Dilation: 2 Effacement (%): 80 Cervical Position: Middle Station: -2 Presentation: Vertex Exam by:: Artelia Laroche CNM   Musculoskeletal: Normal range of motion.  Neurological: She is alert.  Skin: Skin is warm and dry.  Psychiatric: She has a normal mood and affect.    Prenatal labs: ABO, Rh: O/POS/-- (02/07 1620) Antibody: NEG (02/07 1620) Rubella: >500.0 (02/07 1620) RPR: NON REAC (07/01 1658)  HBsAg: NEGATIVE (02/07 1620)  HIV: NON REACTIVE (07/01 1658)  GBS: Negative (09/12 0000)   Assessment/Plan: Latent labor at 39.[redacted] weeks EGA. I will admit and augment prn. Expect SVD. She wants a PPS tomorrow. She signed her MCD forms more than 30 days ago.   Shernita Rabinovich C. 12/31/2011, 9:43 PM

## 2012-01-01 ENCOUNTER — Encounter (HOSPITAL_COMMUNITY): Payer: Self-pay | Admitting: Obstetrics & Gynecology

## 2012-01-01 ENCOUNTER — Encounter (HOSPITAL_COMMUNITY)
Admission: AD | Disposition: A | Discharge status: Home / Self Care | Payer: Self-pay | Source: Ambulatory Visit | Attending: Obstetrics & Gynecology

## 2012-01-01 ENCOUNTER — Encounter (HOSPITAL_COMMUNITY): Payer: Self-pay | Admitting: Anesthesiology

## 2012-01-01 ENCOUNTER — Inpatient Hospital Stay (HOSPITAL_COMMUNITY): Payer: Medicaid Other | Admitting: Anesthesiology

## 2012-01-01 DIAGNOSIS — O0940 Supervision of pregnancy with grand multiparity, unspecified trimester: Secondary | ICD-10-CM

## 2012-01-01 DIAGNOSIS — O878 Other venous complications in the puerperium: Secondary | ICD-10-CM

## 2012-01-01 DIAGNOSIS — K649 Unspecified hemorrhoids: Secondary | ICD-10-CM

## 2012-01-01 DIAGNOSIS — Z302 Encounter for sterilization: Secondary | ICD-10-CM

## 2012-01-01 HISTORY — PX: TUBAL LIGATION: SHX77

## 2012-01-01 LAB — SURGICAL PCR SCREEN: MRSA, PCR: NEGATIVE

## 2012-01-01 SURGERY — LIGATION, FALLOPIAN TUBE, POSTPARTUM
Anesthesia: Epidural | Site: Abdomen | Laterality: Bilateral | Wound class: Clean Contaminated

## 2012-01-01 MED ORDER — SIMETHICONE 80 MG PO CHEW
80.0000 mg | CHEWABLE_TABLET | ORAL | Status: DC | PRN
  Administered 2012-01-01: 80 mg via ORAL

## 2012-01-01 MED ORDER — HYDROMORPHONE HCL PF 1 MG/ML IJ SOLN
INTRAMUSCULAR | Status: AC
  Administered 2012-01-01: 0.5 mg via INTRAVENOUS
  Filled 2012-01-01: qty 1

## 2012-01-01 MED ORDER — SENNOSIDES-DOCUSATE SODIUM 8.6-50 MG PO TABS
2.0000 | ORAL_TABLET | Freq: Every day | ORAL | Status: DC
  Administered 2012-01-01: 2 via ORAL

## 2012-01-01 MED ORDER — LIDOCAINE-EPINEPHRINE (PF) 2 %-1:200000 IJ SOLN
INTRAMUSCULAR | Status: AC
  Filled 2012-01-01: qty 20

## 2012-01-01 MED ORDER — HYDROMORPHONE HCL PF 1 MG/ML IJ SOLN
0.2500 mg | INTRAMUSCULAR | Status: DC | PRN
  Administered 2012-01-01: 0.5 mg via INTRAVENOUS

## 2012-01-01 MED ORDER — MIDAZOLAM HCL 5 MG/5ML IJ SOLN
INTRAMUSCULAR | Status: DC | PRN
  Administered 2012-01-01: 1 mg via INTRAVENOUS

## 2012-01-01 MED ORDER — TETANUS-DIPHTH-ACELL PERTUSSIS 5-2.5-18.5 LF-MCG/0.5 IM SUSP: 0.5000 mL | Freq: Once | INTRAMUSCULAR | Status: DC

## 2012-01-01 MED ORDER — WITCH HAZEL-GLYCERIN EX PADS
1.0000 "application " | MEDICATED_PAD | CUTANEOUS | Status: DC | PRN
  Administered 2012-01-01: 1 via TOPICAL

## 2012-01-01 MED ORDER — MIDAZOLAM HCL 2 MG/2ML IJ SOLN
INTRAMUSCULAR | Status: AC
  Filled 2012-01-01: qty 2

## 2012-01-01 MED ORDER — BENZOCAINE-MENTHOL 20-0.5 % EX AERO
1.0000 "application " | INHALATION_SPRAY | CUTANEOUS | Status: DC | PRN
  Administered 2012-01-01: 1 via TOPICAL
  Filled 2012-01-01: qty 56

## 2012-01-01 MED ORDER — PRENATAL MULTIVITAMIN CH
1.0000 | ORAL_TABLET | Freq: Every day | ORAL | Status: DC
  Administered 2012-01-02: 1 via ORAL
  Filled 2012-01-01: qty 1

## 2012-01-01 MED ORDER — LANOLIN HYDROUS EX OINT: TOPICAL_OINTMENT | CUTANEOUS | Status: DC | PRN

## 2012-01-01 MED ORDER — BUPIVACAINE HCL (PF) 0.5 % IJ SOLN
INTRAMUSCULAR | Status: AC
  Filled 2012-01-01: qty 30

## 2012-01-01 MED ORDER — LIDOCAINE HCL (PF) 1 % IJ SOLN
INTRAMUSCULAR | Status: DC | PRN
  Administered 2012-01-01 (×2): 4 mL

## 2012-01-01 MED ORDER — LACTATED RINGERS IV SOLN
INTRAVENOUS | Status: DC
  Administered 2012-01-01: 10:00:00 via INTRAVENOUS

## 2012-01-01 MED ORDER — LACTATED RINGERS IV SOLN
INTRAVENOUS | Status: DC | PRN
  Administered 2012-01-01 (×2): via INTRAVENOUS

## 2012-01-01 MED ORDER — METOCLOPRAMIDE HCL 10 MG PO TABS
10.0000 mg | ORAL_TABLET | Freq: Once | ORAL | Status: AC
  Administered 2012-01-01: 10 mg via ORAL
  Filled 2012-01-01: qty 1

## 2012-01-01 MED ORDER — FENTANYL 2.5 MCG/ML BUPIVACAINE 1/10 % EPIDURAL INFUSION (WH - ANES)
INTRAMUSCULAR | Status: DC | PRN
  Administered 2012-01-01: 13 mL/h via EPIDURAL

## 2012-01-01 MED ORDER — ONDANSETRON HCL 4 MG PO TABS: 4.0000 mg | ORAL_TABLET | ORAL | Status: DC | PRN

## 2012-01-01 MED ORDER — DIBUCAINE 1 % RE OINT
1.0000 "application " | TOPICAL_OINTMENT | RECTAL | Status: DC | PRN
  Administered 2012-01-01: 1 via RECTAL
  Filled 2012-01-01: qty 28

## 2012-01-01 MED ORDER — ZOLPIDEM TARTRATE 5 MG PO TABS: 5.0000 mg | ORAL_TABLET | Freq: Every evening | ORAL | Status: DC | PRN

## 2012-01-01 MED ORDER — IBUPROFEN 600 MG PO TABS
600.0000 mg | ORAL_TABLET | Freq: Four times a day (QID) | ORAL | Status: DC
  Administered 2012-01-01 – 2012-01-02 (×4): 600 mg via ORAL
  Filled 2012-01-01 (×4): qty 1

## 2012-01-01 MED ORDER — BUPIVACAINE HCL 0.5 % IJ SOLN
INTRAMUSCULAR | Status: DC | PRN
  Administered 2012-01-01: 10 mL

## 2012-01-01 MED ORDER — DIPHENHYDRAMINE HCL 25 MG PO CAPS: 25.0000 mg | ORAL_CAPSULE | Freq: Four times a day (QID) | ORAL | Status: DC | PRN

## 2012-01-01 MED ORDER — SODIUM BICARBONATE 8.4 % IV SOLN
INTRAVENOUS | Status: AC
  Filled 2012-01-01: qty 50

## 2012-01-01 MED ORDER — FAMOTIDINE 20 MG PO TABS
40.0000 mg | ORAL_TABLET | Freq: Once | ORAL | Status: AC
  Administered 2012-01-01: 40 mg via ORAL
  Filled 2012-01-01: qty 2

## 2012-01-01 MED ORDER — SODIUM BICARBONATE 8.4 % IV SOLN
INTRAVENOUS | Status: DC | PRN
  Administered 2012-01-01: 3 mL via EPIDURAL

## 2012-01-01 MED ORDER — ONDANSETRON HCL 4 MG/2ML IJ SOLN: 4.0000 mg | INTRAMUSCULAR | Status: DC | PRN

## 2012-01-01 MED ORDER — OXYCODONE-ACETAMINOPHEN 5-325 MG PO TABS
1.0000 | ORAL_TABLET | ORAL | Status: DC | PRN
  Administered 2012-01-01 – 2012-01-02 (×4): 1 via ORAL
  Filled 2012-01-01 (×4): qty 1

## 2012-01-01 SURGICAL SUPPLY — 23 items
BENZOIN TINCTURE PRP APPL 2/3 (GAUZE/BANDAGES/DRESSINGS) ×2 IMPLANT
CATH ROBINSON RED A/P 16FR (CATHETERS) ×2 IMPLANT
CHLORAPREP W/TINT 26ML (MISCELLANEOUS) ×2 IMPLANT
CLIP FILSHIE TUBAL LIGA STRL (Clip) ×2 IMPLANT
CLOTH BEACON ORANGE TIMEOUT ST (SAFETY) ×2 IMPLANT
CONTAINER PREFILL 10% NBF 15ML (MISCELLANEOUS) IMPLANT
DERMABOND ADVANCED (GAUZE/BANDAGES/DRESSINGS)
DERMABOND ADVANCED .7 DNX12 (GAUZE/BANDAGES/DRESSINGS) IMPLANT
DRSG COVADERM PLUS 2X2 (GAUZE/BANDAGES/DRESSINGS) ×2 IMPLANT
GLOVE BIOGEL PI IND STRL 7.0 (GLOVE) ×2 IMPLANT
GLOVE BIOGEL PI INDICATOR 7.0 (GLOVE) ×2
GOWN PREVENTION PLUS LG XLONG (DISPOSABLE) ×2 IMPLANT
GOWN STRL REIN XL XLG (GOWN DISPOSABLE) ×2 IMPLANT
NEEDLE HYPO 25X1 1.5 SAFETY (NEEDLE) ×2 IMPLANT
NS IRRIG 1000ML POUR BTL (IV SOLUTION) ×2 IMPLANT
PACK ABDOMINAL MINOR (CUSTOM PROCEDURE TRAY) ×2 IMPLANT
STRIP CLOSURE SKIN 1/4X4 (GAUZE/BANDAGES/DRESSINGS) ×2 IMPLANT
SUT VIC AB 0 CT1 27 (SUTURE) ×1
SUT VIC AB 0 CT1 27XBRD ANBCTR (SUTURE) ×1 IMPLANT
SUT VIC AB 4-0 PS2 27 (SUTURE) ×2 IMPLANT
SYR CONTROL 10ML LL (SYRINGE) ×2 IMPLANT
TOWEL OR 17X24 6PK STRL BLUE (TOWEL DISPOSABLE) ×4 IMPLANT
WATER STERILE IRR 1000ML POUR (IV SOLUTION) IMPLANT

## 2012-01-01 NOTE — Anesthesia Postprocedure Evaluation (Addendum)
  Anesthesia Post-op Note  Patient: Tina Holder  Procedure(s) Performed: Procedure(s) (LRB) with comments: POST PARTUM TUBAL LIGATION (Bilateral) - with filshie clips  Patient Location: Mother/Baby  Anesthesia Type: Epidural  Level of Consciousness: awake, alert  and oriented  Airway and Oxygen Therapy: Patient Spontanous Breathing  Post-op Pain: mild  Post-op Assessment: Post-op Vital signs reviewed, Patient's Cardiovascular Status Stable, Pain level controlled, No headache, No residual numbness and No residual motor weakness.   Marland Kitchen Post-op Vital Signs: Reviewed and stable  Complications: No apparent anesthesia complications

## 2012-01-01 NOTE — Addendum Note (Signed)
Addendum  created 01/01/12 1819 by Graciela Husbands, CRNA   Modules edited:Notes Section

## 2012-01-01 NOTE — Op Note (Signed)
Tina Holder 12/31/2011 - 01/01/2012  PREOPERATIVE DIAGNOSIS:  Multiparity, undesired fertility  POSTOPERATIVE DIAGNOSIS:  Multiparity, undesired fertility  PROCEDURE:  Postpartum Bilateral Tubal Sterilization using Filshie Clips   ANESTHESIA:  Epidural and local analgesia using 0.5% Marcaine  COMPLICATIONS:  None immediate.  ESTIMATED BLOOD LOSS: 5 ml.  FLUIDS:1067ml LR.  URINE OUTPUT:  N/a  INDICATIONS: 26 y.o. [id number]  with undesired fertility,status post vaginal delivery, desires permanent sterilization.  Other reversible forms of contraception were discussed with patient; she declines all other modalities. Risks of procedure discussed with patient including but not limited to: risk of regret, permanence of method, bleeding, infection, injury to surrounding organs and need for additional procedures.  Failure risk of 0.5-1% with increased risk of ectopic gestation if pregnancy occurs was also discussed with patient.     FINDINGS:  Normal uterus, tubes, and ovaries.  PROCEDURE DETAILS: The patient was taken to the operating room where her epidural anesthesia was dosed up to surgical level and found to be adequate.  She was then placed in the dorsal supine position and prepped and draped in sterile fashion.  After an adequate timeout was performed, attention was turned to the patient's abdomen where a small transverse skin incision was made under the umbilical fold. The incision was taken down to the layer of fascia using the scalpel, and fascia was incised, and extended bilaterally using Mayo scissors. The peritoneum was entered in a sharp fashion. Attention was then turned to the patient's uterus, and left fallopian tube was identified and followed out to the fimbriated end.  A Filshie clip was placed on the left fallopian tube about 3 cm from the cornual attachment, with care given to incorporate the underlying mesosalpinx.  A similar process was carried out on the right side allowing for  bilateral tubal sterilization.  Good hemostasis was noted overall.  The instruments were then removed from the patient's abdomen and the fascial incision was repaired with 0 Vicryl, and the skin was closed with a 4-0 Vicryl subcuticular stitch. The patient tolerated the procedure well.  Instrument, sponge, and needle counts were correct times two.  The patient was then taken to the recovery room awake and in stable condition.  [name] L. Harraway-Smith, M.D., [facility]

## 2012-01-01 NOTE — Anesthesia Procedure Notes (Signed)
Epidural Patient location during procedure: OB Start time: 01/01/2012 12:05 AM  Staffing Anesthesiologist: Birney Belshe A. Performed by: anesthesiologist   Preanesthetic Checklist Completed: patient identified, site marked, surgical consent, pre-op evaluation, timeout performed, IV checked, risks and benefits discussed and monitors and equipment checked  Epidural Patient position: sitting Prep: site prepped and draped and DuraPrep Patient monitoring: continuous pulse ox and blood pressure Approach: midline Injection technique: LOR air  Needle:  Needle type: Tuohy  Needle gauge: 17 G Needle length: 9 cm and 9 Needle insertion depth: 6 cm Catheter type: closed end flexible Catheter size: 19 Gauge Catheter at skin depth: 11 cm Test dose: negative and Other  Assessment Events: blood not aspirated, injection not painful, no injection resistance, negative IV test and no paresthesia  Additional Notes Patient identified. Risks and benefits discussed including failed block, incomplete  Pain control, post dural puncture headache, nerve damage, paralysis, blood pressure Changes, nausea, vomiting, reactions to medications-both toxic and allergic and post Partum back pain. All questions were answered. Patient expressed understanding and wished to proceed. Sterile technique was used throughout procedure. Epidural site was Dressed with sterile barrier dressing. No paresthesias, signs of intravascular injection Or signs of intrathecal spread were encountered.  Patient was more comfortable after the epidural was dosed. Please see RN's note for documentation of vital signs and FHR which are stable.

## 2012-01-01 NOTE — Anesthesia Postprocedure Evaluation (Signed)
  Anesthesia Post-op Note  Patient: Tina Holder  Procedure(s) Performed: Procedure(s) (LRB) with comments: POST PARTUM TUBAL LIGATION (Bilateral) - with filshie clips   Patient is awake, responsive, moving her legs, and has signs of resolution of her numbness. Pain and nausea are reasonably well controlled. Vital signs are stable and clinically acceptable. Oxygen saturation is clinically acceptable. There are no apparent anesthetic complications at this time. Patient is ready for discharge.

## 2012-01-01 NOTE — Anesthesia Postprocedure Evaluation (Signed)
  Anesthesia Post-op Note  Patient: Tina Holder  Procedure(s) Performed: Lumbar Epidural  Complications: No apparent anesthesia complications

## 2012-01-01 NOTE — Progress Notes (Signed)
UR Chart review completed.  

## 2012-01-01 NOTE — Addendum Note (Signed)
Addendum  created 01/01/12 1815 by Graciela Husbands, CRNA   Modules edited:Notes Section

## 2012-01-01 NOTE — Transfer of Care (Signed)
Immediate Anesthesia Transfer of Care Note  Patient: Tina Holder  Procedure(s) Performed: Procedure(s) (LRB) with comments: POST PARTUM TUBAL LIGATION (Bilateral) - with filshie clips  Patient Location: PACU  Anesthesia Type: Epidural  Level of Consciousness: awake, alert , oriented and patient cooperative  Airway & Oxygen Therapy: Patient Spontanous Breathing  Post-op Assessment: Report given to PACU RN and Post -op Vital signs reviewed and stable  Post vital signs: Reviewed and stable  Complications: No apparent anesthesia complications

## 2012-01-01 NOTE — Anesthesia Preprocedure Evaluation (Signed)
Anesthesia Evaluation  Patient identified by MRN, date of birth, ID band Patient awake    Reviewed: Allergy & Precautions, H&P , Patient's Chart, lab work & pertinent test results  Airway Mallampati: III TM Distance: >3 FB Neck ROM: full    Dental No notable dental hx. (+) Teeth Intact   Pulmonary asthma ,  breath sounds clear to auscultation  Pulmonary exam normal       Cardiovascular negative cardio ROS  Rhythm:regular Rate:Normal     Neuro/Psych  Headaches, negative psych ROS   GI/Hepatic Neg liver ROS, GERD-  Medicated and Controlled,  Endo/Other  negative endocrine ROS  Renal/GU negative Renal ROS  negative genitourinary   Musculoskeletal   Abdominal Normal abdominal exam  (+)   Peds  Hematology negative hematology ROS (+)   Anesthesia Other Findings   Reproductive/Obstetrics (+) Pregnancy                           Anesthesia Physical  Anesthesia Plan  ASA: II  Anesthesia Plan: Epidural   Post-op Pain Management:    Induction:   Airway Management Planned:   Additional Equipment:   Intra-op Plan:   Post-operative Plan:   Informed Consent: I have reviewed the patients History and Physical, chart, labs and discussed the procedure including the risks, benefits and alternatives for the proposed anesthesia with the patient or authorized representative who has indicated his/her understanding and acceptance.     Plan Discussed with: Anesthesiologist  Anesthesia Plan Comments:         Anesthesia Quick Evaluation

## 2012-01-02 LAB — CBC
MCH: 28.4 pg (ref 26.0–34.0)
MCHC: 33.1 g/dL (ref 30.0–36.0)
Platelets: 163 10*3/uL (ref 150–400)

## 2012-01-02 MED ORDER — HYDROCORTISONE 2.5 % RE CREA: TOPICAL_CREAM | Freq: Two times a day (BID) | RECTAL | Status: DC

## 2012-01-02 MED ORDER — IBUPROFEN 600 MG PO TABS: 600.0000 mg | ORAL_TABLET | Freq: Four times a day (QID) | ORAL | Status: DC

## 2012-01-02 MED ORDER — DOCUSATE SODIUM 100 MG PO CAPS: 100.0000 mg | ORAL_CAPSULE | Freq: Two times a day (BID) | ORAL | Status: DC | PRN

## 2012-01-02 NOTE — Discharge Summary (Signed)
Obstetric Discharge Summary Reason for Admission: onset of labor Prenatal Procedures: none Intrapartum Procedures: spontaneous vaginal delivery Postpartum Procedures: P.P. tubal ligation Complications-Operative and Postpartum: vaginal laceration 1 suture, hemorrhoids    Subjective: Patient reports that she is doing well. Ambulating, eating/drinking, voiding, and +flatus. No bowel movement yet. Breast feeding is going well. Baby boy circumcision to be done on Tuesday at doctor's office. Patient reports having discomfort from hemorrhoids.   Hemoglobin  Date Value Range Status  01/02/2012 9.1* 12.0 - 15.0 g/dL Final     DELTA CHECK NOTED     REPEATED TO VERIFY     HCT  Date Value Range Status  01/02/2012 27.5* 36.0 - 46.0 % Final    Physical Exam:  General: alert, cooperative and no distress Cardio: normal rate, 2+ DP pulses bilaterally. Pulm: normal rate, no respiratory distress.  Lochia: appropriate Uterine Fundus: firm Incision: BTL incision- healing well, no significant drainage, no dehiscence, no significant erythema DVT Evaluation: No evidence of DVT seen on physical exam. Negative Homan's sign. No cords or calf tenderness. No significant calf/ankle edema.  Discharge Diagnoses: Term Pregnancy-delivered and Post partum bilateral tubal ligation.  Discharge Information: Date: 01/02/2012 Activity: pelvic rest Diet: routine Medications: Colace, Percocet and hemorrhoid cream. Condition: stable Instructions: refer to practice specific booklet Discharge to: home   Newborn Data: Live born female  Birth Weight: 7 lb 1 oz (3204 g) APGAR: 9, 9  Home with mother.  Winfield Cunas 01/02/2012, 7:26 AM   I have seen and examined patient and agree with above. Patient is worried about hemorrhoids as RN states one is fairly large. However, she is not having much pain with them, just mild discomfort and is worried about them getting worse. Discussed sitz baths, hemorrhoid cream and  keeping stools soft.  Napoleon Form, MD

## 2012-01-04 ENCOUNTER — Encounter (HOSPITAL_COMMUNITY): Payer: Self-pay | Admitting: Obstetrics & Gynecology

## 2012-01-05 ENCOUNTER — Encounter: Payer: Medicaid Other | Admitting: Family Medicine

## 2012-01-19 ENCOUNTER — Encounter: Payer: Self-pay | Admitting: Nurse Practitioner

## 2012-01-19 ENCOUNTER — Ambulatory Visit (INDEPENDENT_AMBULATORY_CARE_PROVIDER_SITE_OTHER): Payer: Medicaid Other | Admitting: Nurse Practitioner

## 2012-01-19 VITALS — BP 115/74 | HR 84 | Ht 62.0 in | Wt 175.0 lb

## 2012-01-19 DIAGNOSIS — G43909 Migraine, unspecified, not intractable, without status migrainosus: Secondary | ICD-10-CM

## 2012-01-19 MED ORDER — ONDANSETRON HCL 4 MG PO TABS: 4.0000 mg | ORAL_TABLET | Freq: Three times a day (TID) | ORAL | Status: DC | PRN

## 2012-01-19 MED ORDER — IBUPROFEN 800 MG PO TABS: 800.0000 mg | ORAL_TABLET | Freq: Three times a day (TID) | ORAL | Status: DC | PRN

## 2012-01-19 MED ORDER — RIZATRIPTAN BENZOATE 10 MG PO TABS: 10.0000 mg | ORAL_TABLET | ORAL | Status: DC | PRN

## 2012-01-19 MED ORDER — PROMETHAZINE HCL 25 MG PO TABS: 25.0000 mg | ORAL_TABLET | Freq: Four times a day (QID) | ORAL | Status: DC | PRN

## 2012-01-19 NOTE — Patient Instructions (Signed)
Migraine Headache A migraine headache is an intense, throbbing pain on one or both sides of your head. A migraine can last for 30 minutes to several hours. CAUSES  The exact cause of a migraine headache is not always known. However, a migraine may be caused when nerves in the brain become irritated and release chemicals that cause inflammation. This causes pain. SYMPTOMS  Pain on one or both sides of your head.  Pulsating or throbbing pain.  Severe pain that prevents daily activities.  Pain that is aggravated by any physical activity.  Nausea, vomiting, or both.  Dizziness.  Pain with exposure to bright lights, loud noises, or activity.  General sensitivity to bright lights, loud noises, or smells. Before you get a migraine, you may get warning signs that a migraine is coming (aura). An aura may include:  Seeing flashing lights.  Seeing bright spots, halos, or zig-zag lines.  Having tunnel vision or blurred vision.  Having feelings of numbness or tingling.  Having trouble talking.  Having muscle weakness. MIGRAINE TRIGGERS  Alcohol.  Smoking.  Stress.  Menstruation.  Aged cheeses.  Foods or drinks that contain nitrates, glutamate, aspartame, or tyramine.  Lack of sleep.  Chocolate.  Caffeine.  Hunger.  Physical exertion.  Fatigue.  Medicines used to treat chest pain (nitroglycerine), birth control pills, estrogen, and some blood pressure medicines. DIAGNOSIS  A migraine headache is often diagnosed based on:  Symptoms.  Physical examination.  A CT scan or MRI of your head. TREATMENT Medicines may be given for pain and nausea. Medicines can also be given to help prevent recurrent migraines.  HOME CARE INSTRUCTIONS  Only take over-the-counter or prescription medicines for pain or discomfort as directed by your caregiver. The use of long-term narcotics is not recommended.  Lie down in a dark, quiet room when you have a migraine.  Keep a journal  to find out what may trigger your migraine headaches. For example, write down:  What you eat and drink.  How much sleep you get.  Any change to your diet or medicines.  Limit alcohol consumption.  Quit smoking if you smoke.  Get 7 to 9 hours of sleep, or as recommended by your caregiver.  Limit stress.  Keep lights dim if bright lights bother you and make your migraines worse. SEEK IMMEDIATE MEDICAL CARE IF:   Your migraine becomes severe.  You have a fever.  You have a stiff neck.  You have vision loss.  You have muscular weakness or loss of muscle control.  You start losing your balance or have trouble walking.  You feel faint or pass out.  You have severe symptoms that are different from your first symptoms. MAKE SURE YOU:   Understand these instructions.  Will watch your condition.  Will get help right away if you are not doing well or get worse. Document Released: 03/16/2005 Document Revised: 06/08/2011 Document Reviewed: 03/06/2011 Exit[employer] Patient Information 2013 [employer].  

## 2012-01-19 NOTE — Progress Notes (Signed)
S: Patient is here for post delivery follow up for migraines. She delivered baby boy Tina Holder on 01/01/2012. She also had BTL done at that same time. She has had 2 migraines after delivery related to hormones and fatigue. She is breast feeding.  A: Migraine   P: Refill Maxalt. Motrin, Phenergan and zofran

## 2012-01-29 ENCOUNTER — Other Ambulatory Visit: Payer: Self-pay | Admitting: Obstetrics & Gynecology

## 2012-02-19 ENCOUNTER — Ambulatory Visit (INDEPENDENT_AMBULATORY_CARE_PROVIDER_SITE_OTHER): Payer: Self-pay | Admitting: Obstetrics & Gynecology

## 2012-02-19 ENCOUNTER — Encounter: Payer: Self-pay | Admitting: Obstetrics & Gynecology

## 2012-02-19 NOTE — Progress Notes (Signed)
  Subjective:    Patient ID: Tina Holder, female    DOB: 05/16/1985, 26 y.o.   MRN: [id number]  HPI  Tina Holder is a 26 yo P33 with a 59 week old son at home. She is here for her pp visit. Her only complaint is that of a sore area in her vagina, dyspareunia. She says that the baby is growing well. She denies any problems with pp depression. She had a PPS for birth control.  Review of Systems Pap smear normal 3/13    Objective:   Physical Exam  Well-involuted uterus 1 cm area of granulation type tissue in a crevice/sulcus on the left edge of the vaginal introitus. I treated it with silver nitrate.      Assessment & Plan:  Pp- stable If her vulvar pain resolves, RTC 1 year. If not, then RTC 1 month.

## 2012-04-13 ENCOUNTER — Telehealth: Payer: Self-pay | Admitting: *Deleted

## 2012-04-13 NOTE — Telephone Encounter (Signed)
Patient is breastfeeding and continues to have cracking nipples even though she is religiously using lanolin ointment.  Do you have any other suggestions for her?

## 2012-04-14 MED ORDER — FLUCONAZOLE 150 MG PO TABS: 150.0000 mg | ORAL_TABLET | Freq: Every day | ORAL | Status: DC

## 2012-04-14 NOTE — Addendum Note (Signed)
Addended by: Reva Bores on: 04/14/2012 10:38 AM   Modules accepted: Orders

## 2012-04-14 NOTE — Telephone Encounter (Signed)
Trial of diflucan--keep nipples very try--use vinegar mix and air dry after completion of diflucan.  Stop Lanolin.

## 2012-05-19 ENCOUNTER — Ambulatory Visit: Payer: Self-pay | Admitting: Family Medicine

## 2012-05-24 ENCOUNTER — Ambulatory Visit: Payer: Self-pay | Admitting: Family Medicine

## 2012-11-09 ENCOUNTER — Other Ambulatory Visit: Payer: Self-pay | Admitting: Nurse Practitioner

## 2013-08-14 ENCOUNTER — Encounter: Payer: Self-pay | Admitting: Obstetrics & Gynecology

## 2013-08-14 ENCOUNTER — Ambulatory Visit (INDEPENDENT_AMBULATORY_CARE_PROVIDER_SITE_OTHER): Payer: Self-pay | Admitting: Obstetrics & Gynecology

## 2013-08-14 VITALS — BP 109/85 | HR 90 | Ht 62.0 in | Wt 183.0 lb

## 2013-08-14 DIAGNOSIS — Z01419 Encounter for gynecological examination (general) (routine) without abnormal findings: Secondary | ICD-10-CM

## 2013-08-14 DIAGNOSIS — Z0000 Encounter for general adult medical examination without abnormal findings: Secondary | ICD-10-CM

## 2013-08-14 DIAGNOSIS — Z1151 Encounter for screening for human papillomavirus (HPV): Secondary | ICD-10-CM

## 2013-08-14 DIAGNOSIS — N912 Amenorrhea, unspecified: Secondary | ICD-10-CM

## 2013-08-14 DIAGNOSIS — Z124 Encounter for screening for malignant neoplasm of cervix: Secondary | ICD-10-CM

## 2013-08-14 NOTE — Progress Notes (Signed)
Subjective:    Tina Holder is a 28 y.o. female who presents for an annual exam. She has a negative UPT but complains of pregnancy symptoms (mood swings, migraines more frequent, some slight cramping). She is still breast feeding (nap time and bed time) and has not had a period since her delivery 20 months ago.The patient is sexually active. GYN screening history: last pap: was normal. The patient wears seatbelts: yes. The patient participates in regular exercise: yes. Has the patient ever been transfused or tattooed?: no. The patient reports that there is not domestic violence in her life.   Menstrual History: OB History   Grav Para Term Preterm Abortions TAB SAB Ect Mult Living   3 3 3  0 0 0 0 0 0 3      Menarche age: 59  No LMP recorded. Patient is not currently having periods (Reason: Lactating).    The following portions of the patient's history were reviewed and updated as appropriate: allergies, current medications, past family history, past medical history, past social history, past surgical history and problem list.  Review of Systems Pertinent items are noted in HPI. Married for 10 years, denies dyspareunia. 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   Objective:    BP 109/85  Pulse 90  Ht 5\' 2"  (1.575 m)  Wt 183 lb (83.008 kg)  BMI 33.46 kg/m2  Breastfeeding? Yes  General Appearance:    Alert, cooperative, no distress, appears stated age  Head:    Normocephalic, without obvious abnormality, atraumatic  Eyes:    PERRL, conjunctiva/corneas clear, EOM's intact, fundi    benign, both eyes  Ears:    Normal TM's and external ear canals, both ears  Nose:   Nares normal, septum midline, mucosa normal, no drainage    or sinus tenderness  Throat:   Lips, mucosa, and tongue normal; teeth and gums normal  Neck:   Supple, symmetrical, trachea midline, no adenopathy;    thyroid:  no enlargement/tenderness/nodules; no carotid   bruit or JVD  Back:     Symmetric, no curvature, ROM normal, no  CVA tenderness  Lungs:     Clear to auscultation bilaterally, respirations unlabored  Chest Wall:    No tenderness or deformity   Heart:    Regular rate and rhythm, S1 and S2 normal, no murmur, rub   or gallop  Breast Exam:    No tenderness, masses, or nipple abnormality  Abdomen:     Soft, non-tender, bowel sounds active all four quadrants,    no masses, no organomegaly  Genitalia:    Normal female without lesion, discharge or tenderness, NSSA, NT, mobile, normal adnexal exam     Extremities:   Extremities normal, atraumatic, no cyanosis or edema  Pulses:   2+ and symmetric all extremities  Skin:   Skin color, texture, turgor normal, no rashes or lesions  Lymph nodes:   Cervical, supraclavicular, and axillary nodes normal  Neurologic:   CNII-XII intact, normal strength, sensation and reflexes    throughout  .    Assessment:    Healthy female exam.  Amenorrhea with only small amount of breast feeding   Plan:     tsh,  Pap with cotesting UPT today negative When she quits breastfeeding, I will do a prolactin challenge if she doesn't start her period spontaneously.

## 2013-08-15 LAB — TSH: TSH: 1.858 u[IU]/mL (ref 0.350–4.500)

## 2013-09-12 ENCOUNTER — Encounter: Payer: Self-pay | Admitting: Obstetrics & Gynecology

## 2013-10-10 ENCOUNTER — Ambulatory Visit: Payer: Self-pay | Admitting: Obstetrics & Gynecology

## 2013-10-10 ENCOUNTER — Telehealth: Payer: Self-pay | Admitting: *Deleted

## 2013-10-10 DIAGNOSIS — R102 Pelvic and perineal pain: Secondary | ICD-10-CM

## 2013-10-10 DIAGNOSIS — N939 Abnormal uterine and vaginal bleeding, unspecified: Secondary | ICD-10-CM

## 2013-10-10 NOTE — Telephone Encounter (Signed)
Patient would like to move forward with the pelvic ultrasound as suggested at her last office visit with Dr. Hulan Fray.  The order has been placed and the ultrasound will be scheduled by Manuela Schwartz and the patient will be notified.

## 2013-10-18 ENCOUNTER — Ambulatory Visit (HOSPITAL_COMMUNITY): Payer: BC Managed Care – PPO

## 2014-01-29 ENCOUNTER — Encounter: Payer: Self-pay | Admitting: Obstetrics & Gynecology

## 2014-09-07 ENCOUNTER — Encounter: Payer: Self-pay | Admitting: *Deleted

## 2014-09-07 ENCOUNTER — Emergency Department: Payer: Self-pay

## 2014-09-07 ENCOUNTER — Emergency Department
Admission: EM | Admit: 2014-09-07 | Discharge: 2014-09-07 | Disposition: A | Discharge status: Home / Self Care | Payer: Self-pay | Attending: Emergency Medicine | Admitting: Emergency Medicine

## 2014-09-07 DIAGNOSIS — T50905A Adverse effect of unspecified drugs, medicaments and biological substances, initial encounter: Secondary | ICD-10-CM

## 2014-09-07 DIAGNOSIS — K208 Other esophagitis without bleeding: Secondary | ICD-10-CM

## 2014-09-07 MED ORDER — GI COCKTAIL ~~LOC~~
30.0000 mL | Freq: Once | ORAL | Status: AC
  Administered 2014-09-07: 30 mL via ORAL
  Filled 2014-09-07: qty 30

## 2014-09-07 MED ORDER — SUCRALFATE 1 GM/10ML PO SUSP: 1.0000 g | Freq: Three times a day (TID) | ORAL | Status: DC

## 2014-09-07 NOTE — ED Provider Notes (Signed)
[facility] Emergency Department Provider Note  ____________________________________________  Time seen: Approximately 10:07 AM  I have reviewed the triage vital signs and the nursing notes.   HISTORY  Chief Complaint Dysphagia   HPI Tina Holder is a 29 y.o. female states she has had throat pain since she took a tramadol two days ago.She states after she took the pills she went to bed. She describes it as an irritating sensation in the middle of her throat. Since that time she has continued to eat and drink fluids without any difficulty and no difficulty swallowing. The sensation is aggravating her and she is tried drinking smoothies and eating ice cream without any relief. She denies any difficulty breathing. She rates her pain as 7 out of 10 at present.   Past Medical History  Diagnosis Date  . Headache(784.0)   . Pregnancy induced hypertension   . Obesity     Patient Active Problem List   Diagnosis Date Noted  . Sterilization 01/01/2012  . GERD (gastroesophageal reflux disease) 11/25/2011  . Asthma 09/22/2011  . Supervision of other normal pregnancy 06/10/2011  . Low birth weight in full term infant, 1750-1999 grams 06/10/2011  . Hx of preeclampsia, prior pregnancy, currently pregnant 06/10/2011  . Migraine headache 06/10/2011  . ALLERGIC RHINITIS CAUSE UNSPECIFIED 06/01/2010    Past Surgical History  Procedure Laterality Date  . Tonsillectomy  2005  . Wisdom tooth extraction    . Tubal ligation  01/01/2012    Procedure: POST PARTUM TUBAL LIGATION;  Surgeon: Lavonia Drafts, MD;  Location: [place] [facility];  Service: Gynecology;  Laterality: Bilateral;  with filshie clips    Current Outpatient Rx  Name  Route  Sig  Dispense  Refill  . EXPIRED: albuterol (PROVENTIL HFA;VENTOLIN HFA) 108 (90 BASE) MCG/ACT inhaler   Inhalation   Inhale 2 puffs into the lungs every 6 (six) hours as needed for wheezing.   1 Inhaler   2   . docusate sodium  (COLACE) 100 MG capsule   Oral   Take 1 capsule (100 mg total) by mouth 2 (two) times daily as needed for constipation.   30 capsule   2   . ibuprofen (ADVIL,MOTRIN) 800 MG tablet   Oral   Take 1 tablet (800 mg total) by mouth every 8 (eight) hours as needed for pain.   60 tablet   1   . promethazine (PHENERGAN) 25 MG tablet      TAKE ONE TABLET BY MOUTH EVERY SIX HOURS AS NEEDED FOR NAUSEA   30 tablet   0   . rizatriptan (MAXALT-MLT) 10 MG disintegrating tablet      Take 1 tablet by mouth as needed for migraine. May repeat in 2 hours if needed   12 tablet   10   . sucralfate (CARAFATE) 1 GM/10ML suspension   Oral   Take 10 mLs (1 g total) by mouth 4 (four) times daily -  with meals and at bedtime.   100 mL   0     Allergies Review of patient's allergies indicates no known allergies.  Family History  Problem Relation Age of Onset  . Arthritis Mother   . Thyroid disease Maternal Grandmother   . Lupus Maternal Grandmother     Social History History  Substance Use Topics  . Smoking status: Never Smoker   . Smokeless tobacco: Never Used  . Alcohol Use: No    Review of Systems Constitutional: No fever/chills Eyes: No visual changes. ENT: Sore throat  only after taking tramadol Cardiovascular: Denies chest pain. Respiratory: Denies shortness of breath. Gastrointestinal: No abdominal pain.  No nausea, no vomiting. Skin: Negative for rash. Neurological: Positive for headaches, no focal weakness or numbness.  10-point ROS otherwise negative.  ____________________________________________   PHYSICAL EXAM:  VITAL SIGNS: ED Triage Vitals  Enc Vitals Group     BP 09/07/14 0955 115/90 mmHg     Pulse Rate 09/07/14 0955 80     Resp 09/07/14 0955 20     Temp 09/07/14 0955 98.2 F (36.8 C)     Temp Source 09/07/14 0955 Oral     SpO2 09/07/14 0955 100 %     Weight 09/07/14 0955 190 lb (86.183 kg)     Height 09/07/14 0955 5\' 3"  (1.6 m)     Head Cir --       Peak Flow --      Pain Score 09/07/14 0956 7     Pain Loc --      Pain Edu? --      Excl. in [place]? --     Constitutional: Alert and oriented. Well appearing and in no acute distress. Eyes: Conjunctivae are normal. PERRL. EOMI. Head: Atraumatic. Nose: No congestion/rhinnorhea. Mouth/Throat: Mucous membranes are moist.  Oropharynx non-erythematous. Mild posterior drainage. Neck: No stridor.  Supple Hematological/Lymphatic/Immunilogical: No cervical lymphadenopathy. Cardiovascular: Normal rate, regular rhythm. Grossly normal heart sounds.  Good peripheral circulation. Respiratory: Normal respiratory effort.  No retractions. Lungs CTAB. Gastrointestinal: Soft and nontender. No distention Musculoskeletal: No lower extremity tenderness nor edema.  No joint effusions. Neurologic:  Normal speech and language. No gross focal neurologic deficits are appreciated. Speech is normal. No gait instability. Skin:  Skin is warm, dry and intact. No rash noted. Psychiatric: Mood and affect are normal. Speech and behavior are normal.  ____________________________________________   LABS (all labs ordered are listed, but only abnormal results are displayed)  Labs Reviewed - No data to display   RADIOLOGY: Soft tissue of the neck negative ____________________________________________  PROCEDURES  Procedure(s) performed: None  Critical Care performed: No  ____________________________________________   INITIAL IMPRESSION / ASSESSMENT AND PLAN / ED COURSE  Pertinent labs & imaging results that were available during my care of the patient were reviewed by me and considered in my medical decision making (see chart for details).  After GI cocktail of lidocaine patient states that she is still having discomfort in her throat. X-ray of her neck was normal. This did not reassure her. Dr. Kathyrn Sheriff was paged and discussed with him. Patient was put on Carafate suspension to Her throat. She is to stay on soft  foods and liquids to give her throat a chance to heal. She is to follow-up with gastroenterology if any continued problems. ____________________________________________   FINAL CLINICAL IMPRESSION(S) / ED DIAGNOSES  Final diagnoses:  Pill esophagitis      Johnn Hai, PA-C 09/07/14 1442  Earleen Newport, MD 09/07/14 [phone]

## 2014-09-07 NOTE — ED Notes (Signed)
Pt states that she took some tramadol and feel like the pill is stuck in her throat.

## 2014-09-07 NOTE — Discharge Instructions (Signed)
° °  AVOID ANYTHING SCRATCHY SUCH AS CRACKERS OR CHIPS. SOFT FOODS AND LIQUIDS FOR NEXT 5 DAYS TAKE MEDICATION TO COAT YOUR THROAT

## 2015-03-07 ENCOUNTER — Encounter: Payer: Self-pay | Admitting: *Deleted

## 2015-03-07 ENCOUNTER — Other Ambulatory Visit (INDEPENDENT_AMBULATORY_CARE_PROVIDER_SITE_OTHER): Payer: Self-pay | Admitting: *Deleted

## 2015-03-07 DIAGNOSIS — N912 Amenorrhea, unspecified: Secondary | ICD-10-CM

## 2015-03-07 DIAGNOSIS — Z3201 Encounter for pregnancy test, result positive: Secondary | ICD-10-CM

## 2015-03-07 LAB — POCT URINE PREGNANCY: Preg Test, Ur: POSITIVE — AB

## 2015-03-07 NOTE — Progress Notes (Signed)
Patient here today for urine pregnancy test.  Urine pregnancy test in office today is positive.  Gave patient a proof of pregnancy letter so patient could apply for medicaid.  Patient had a Tubal in 2013 and has irregular periods and had no idea how far a long she was so I did a vaginal ultrasound in office.    Bedside ultrasound today in office shows patient to be [redacted]w[redacted]d, only gestational sac, no yolk sac or fetus.  Patient will return in 3 weeks for new OB appt.

## 2015-03-09 ENCOUNTER — Encounter: Payer: Self-pay | Admitting: Obstetrics & Gynecology

## 2015-03-27 ENCOUNTER — Encounter: Payer: Self-pay | Admitting: Obstetrics & Gynecology

## 2015-03-27 ENCOUNTER — Ambulatory Visit (INDEPENDENT_AMBULATORY_CARE_PROVIDER_SITE_OTHER): Payer: Medicaid Other | Admitting: Obstetrics & Gynecology

## 2015-03-27 VITALS — BP 113/78 | HR 75 | Wt 180.0 lb

## 2015-03-27 DIAGNOSIS — Z3480 Encounter for supervision of other normal pregnancy, unspecified trimester: Secondary | ICD-10-CM | POA: Insufficient documentation

## 2015-03-27 DIAGNOSIS — Z113 Encounter for screening for infections with a predominantly sexual mode of transmission: Secondary | ICD-10-CM | POA: Diagnosis not present

## 2015-03-27 DIAGNOSIS — Z23 Encounter for immunization: Secondary | ICD-10-CM

## 2015-03-27 DIAGNOSIS — Z3481 Encounter for supervision of other normal pregnancy, first trimester: Secondary | ICD-10-CM

## 2015-03-27 DIAGNOSIS — Z3491 Encounter for supervision of normal pregnancy, unspecified, first trimester: Secondary | ICD-10-CM

## 2015-03-27 NOTE — Progress Notes (Signed)
   Subjective:    Tina Holder is a MW [id number] [redacted]w[redacted]d being seen today for her first obstetrical visit.  Her obstetrical history is significant for none. Patient does intend to breast feed. Pregnancy history fully reviewed.  Patient reports no complaints.  Filed Vitals:   03/27/15 1004  BP: 113/78  Pulse: 75  Weight: 180 lb (81.647 kg)    HISTORY: OB History  Gravida Para Term Preterm AB SAB TAB Ectopic Multiple Living  4 3 3  0 0 0 0 0 0 3    # Outcome Date GA Lbr Len/2nd Weight Sex Delivery Anes PTL Lv  4 Current           3 Term 01/01/12 [redacted]w[redacted]d 02:02 / 00:15 7 lb 1 oz (3.204 kg) M Vag-Spont EPI  Y     Comments: WDL  2 Term 12/28/08 [redacted]w[redacted]d  6 lb 3 oz (2.807 kg) M Vag-Spont None N Y     Comments: No complications  1 Term [id number] [redacted]w[redacted]d  4 lb 4 oz (1.928 kg) M Vag-Spont None  Y     Comments: Induced for pre eclempsia/ intrauterine growth restriction/bedrest for 3 weeks.     Past Medical History  Diagnosis Date  . Headache(784.0)   . Pregnancy induced hypertension   . Obesity    Past Surgical History  Procedure Laterality Date  . Tonsillectomy  2005  . Wisdom tooth extraction    . Tubal ligation  01/01/2012    Procedure: POST PARTUM TUBAL LIGATION;  Surgeon: Lavonia Drafts, MD;  Location: [facility];  Service: Gynecology;  Laterality: Bilateral;  with filshie clips   Family History  Problem Relation Age of Onset  . Arthritis Mother   . Thyroid disease Maternal Grandmother   . Lupus Maternal Grandmother      Exam    Uterus:     Pelvic Exam:    Perineum: No Hemorrhoids   Vulva: normal   Vagina:  normal mucosa   pH:    Cervix: anteverted   Adnexa: normal adnexa   Bony Pelvis: android  System: Breast:  normal appearance, no masses or tenderness   Skin: normal coloration and turgor, no rashes    Neurologic: oriented   Extremities: normal strength, tone, and muscle mass   HEENT PERRLA   Mouth/Teeth mucous membranes moist, pharynx normal without lesions   Neck supple   Cardiovascular: regular rate and rhythm   Respiratory:  appears well, vitals normal, no respiratory distress, acyanotic, normal RR, ear and throat exam is normal, neck free of mass or lymphadenopathy, chest clear, no wheezing, crepitations, rhonchi, normal symmetric air entry   Abdomen: soft, non-tender; bowel sounds normal; no masses,  no organomegaly   Urinary: urethral meatus normal      Assessment:    Pregnancy: [id number] Patient Active Problem List   Diagnosis Date Noted  . Sterilization 01/01/2012  . GERD (gastroesophageal reflux disease) 11/25/2011  . Asthma 09/22/2011  . Supervision of other normal pregnancy 06/10/2011  . Low birth weight in full term infant, 1750-1999 grams 06/10/2011  . Hx of preeclampsia, prior pregnancy, currently pregnant 06/10/2011  . Migraine headache 06/10/2011  . ALLERGIC RHINITIS CAUSE UNSPECIFIED 06/01/2010        Plan:     Initial labs drawn. Prenatal vitamins. Problem list reviewed and updated. Genetic Screening discussed First Screen and Quad Screen: declined.  Ultrasound discussed; fetal survey: requested.  Follow up in 4 weeks. Flu vaccine today She wants [facility]  Tina Holder C. 03/27/2015

## 2015-03-27 NOTE — Progress Notes (Signed)
Bedside ultrasound today measures [redacted]w[redacted]d fetus with heartbeat.

## 2015-03-27 NOTE — Assessment & Plan Note (Signed)
BABYSCRIPTS PATIENT: [X] initial, [ ] 12, [ ] 20, [ ] 28, [ ] 32, [ ] 36, [ ] 38, [ ] 39, [ ] 40 

## 2015-03-28 LAB — PRENATAL PROFILE (SOLSTAS)
Antibody Screen: NEGATIVE
Basophils Absolute: 0 10*3/uL (ref 0.0–0.1)
Basophils Relative: 0 % (ref 0–1)
Eosinophils Absolute: 0 10*3/uL (ref 0.0–0.7)
Eosinophils Relative: 0 % (ref 0–5)
HEMATOCRIT: 39.7 % (ref 36.0–46.0)
HEP B S AG: NEGATIVE
HIV: NONREACTIVE
Hemoglobin: 14 g/dL (ref 12.0–15.0)
LYMPHS ABS: 0.9 10*3/uL (ref 0.7–4.0)
LYMPHS PCT: 21 % (ref 12–46)
MCH: 31.7 pg (ref 26.0–34.0)
MCHC: 35.3 g/dL (ref 30.0–36.0)
MCV: 90 fL (ref 78.0–100.0)
MONO ABS: 0.3 10*3/uL (ref 0.1–1.0)
MONOS PCT: 7 % (ref 3–12)
MPV: 10.7 fL (ref 8.6–12.4)
NEUTROS ABS: 3.2 10*3/uL (ref 1.7–7.7)
Neutrophils Relative %: 72 % (ref 43–77)
Platelets: 212 10*3/uL (ref 150–400)
RBC: 4.41 MIL/uL (ref 3.87–5.11)
RDW: 13.5 % (ref 11.5–15.5)
RUBELLA: 8.2 {index} — AB (ref <0.90)
Rh Type: POSITIVE
WBC: 4.5 10*3/uL (ref 4.0–10.5)

## 2015-03-28 LAB — CULTURE, OB URINE
COLONY COUNT: NO GROWTH
Organism ID, Bacteria: NO GROWTH

## 2015-03-30 LAB — URINE CYTOLOGY ANCILLARY ONLY
CHLAMYDIA, DNA PROBE: NEGATIVE
Neisseria Gonorrhea: NEGATIVE

## 2015-03-31 NOTE — L&D Delivery Note (Signed)
Delivery Note Pt pushed with a single contraction and at 2:44 AM a viable female was delivered via Vaginal, Spontaneous Delivery (Presentation: Right Occiput Anterior).  APGAR: 8, 9; weight 5 lb 5 oz (2410 g).  Infant dried and lifted to pt's abd; cord clamped and cut by FOB. Hospital cord blood sample collected. Placenta status: Intact, Spontaneous.  Cord: 3 vessels with the following complications: None.    Anesthesia: Epidural  Episiotomy: None Lacerations: None Est. Blood Loss (mL): 100  Mom to postpartum.  Baby to Couplet care / Skin to Skin.   Serita Grammes CNM 10/17/2015, 2:56 AM

## 2015-04-03 ENCOUNTER — Telehealth: Payer: Self-pay | Admitting: *Deleted

## 2015-04-03 DIAGNOSIS — R110 Nausea: Secondary | ICD-10-CM

## 2015-04-03 LAB — CYSTIC FIBROSIS DIAGNOSTIC STUDY

## 2015-04-03 MED ORDER — PROMETHAZINE HCL 25 MG PO TABS: 25.0000 mg | ORAL_TABLET | Freq: Four times a day (QID) | ORAL | Status: DC | PRN

## 2015-04-03 NOTE — Telephone Encounter (Signed)
Pt was using Diclegis and was given some samples for nausea. Medicaid not approved at this time.  Requesting more medication, samples unavailable, will call in Phenergan for the nausea.

## 2015-04-09 ENCOUNTER — Encounter: Payer: Self-pay | Admitting: Physician Assistant

## 2015-04-09 ENCOUNTER — Ambulatory Visit (INDEPENDENT_AMBULATORY_CARE_PROVIDER_SITE_OTHER): Payer: Medicaid Other | Admitting: Physician Assistant

## 2015-04-09 DIAGNOSIS — G43009 Migraine without aura, not intractable, without status migrainosus: Secondary | ICD-10-CM | POA: Diagnosis not present

## 2015-04-09 DIAGNOSIS — M62838 Other muscle spasm: Secondary | ICD-10-CM | POA: Diagnosis not present

## 2015-04-09 MED ORDER — CYCLOBENZAPRINE HCL 10 MG PO TABS: 10.0000 mg | ORAL_TABLET | Freq: Three times a day (TID) | ORAL | Status: DC | PRN

## 2015-04-09 MED ORDER — METOCLOPRAMIDE HCL 10 MG PO TABS: 10.0000 mg | ORAL_TABLET | Freq: Three times a day (TID) | ORAL | Status: DC | PRN

## 2015-04-09 MED ORDER — BUTALBITAL-APAP-CAFFEINE 50-325-40 MG PO TABS: 1.0000 | ORAL_TABLET | Freq: Four times a day (QID) | ORAL | Status: DC | PRN

## 2015-04-09 NOTE — Patient Instructions (Signed)

## 2015-04-09 NOTE — Progress Notes (Signed)
Patient ID: Tina Holder, female   DOB: 02/16/1986, 30 y.o.   MRN: [id number] History:  Tina Holder is a 30 y.o. [phone] who presents to clinic today for migraine evaluation.  She is [redacted] weeks pregnant.  She started with abdominal migraines in 3rd grade.  In her mid teens she started having more traditional migraines.  They are severe at their worst.  They can last up to 1 week.  It starts on one side but ends up bilateral - often back of head.   There is throbbing/stabbing.  Movement makes it worse, also lights, noises, smells.  Occasional blurry vision or dizziness.   Some nausea and vomiting.   Sometimes wakens during the night with severe HA.   Triggers include lack of sleep, onions, seafood, stress.   Relpax has been helpful but she cannot use in pregnancy.   Fioricet helpful.  Excedrin some limited help if used early.  Imitrex not at all helpful.   Topamax was used for prevention but not lately.   Phenergan is helpful.  She has 3 kids at home: ages 82, 13, and 59.  She had her tubes tied but found herself pregnant. She is having bad morning sickness.  Headaches have been worse since pregnancy.  Usually they improve as pregnancy continues into later weeks.     HIT6:63 Number of days in the last 4 weeks with:  Severe headache: 4 Moderate headache: 4 Mild headache: 2  No headache: 12   Past Medical History  Diagnosis Date  . Headache(784.0)   . Pregnancy induced hypertension   . Obesity     Social History   Social History  . Marital Status: Married    Spouse Name: N/A  . Number of Children: N/A  . Years of Education: N/A   Occupational History  . Not on file.   Social History Main Topics  . Smoking status: Never Smoker   . Smokeless tobacco: Never Used  . Alcohol Use: No  . Drug Use: No  . Sexual Activity: Yes     Comment: tubal   Other Topics Concern  . Not on file   Social History Narrative    Family History  Problem Relation Age of Onset  . Arthritis Mother    . Thyroid disease Maternal Grandmother   . Lupus Maternal Grandmother     No Known Allergies  Current Outpatient Prescriptions on File Prior to Visit  Medication Sig Dispense Refill  . promethazine (PHENERGAN) 25 MG tablet Take 1 tablet (25 mg total) by mouth every 6 (six) hours as needed for nausea or vomiting. 30 tablet 2  . albuterol (PROVENTIL HFA;VENTOLIN HFA) 108 (90 BASE) MCG/ACT inhaler Inhale 2 puffs into the lungs every 6 (six) hours as needed for wheezing. 1 Inhaler 2  . Doxylamine-Pyridoxine (DICLEGIS) 10-10 MG TBEC Take by mouth at bedtime. Reported on 04/09/2015    . eletriptan (RELPAX) 20 MG tablet Take 40 mg by mouth as needed for migraine or headache. Reported on 04/09/2015     No current facility-administered medications on file prior to visit.     Review of Systems:  All pertinent positive/negative included in HPI, all other review of systems are negative  Objective:  Physical Exam LMP 12/27/2014 (Approximate) CONSTITUTIONAL: Well-developed, well-nourished female in no acute distress.  EYES: EOM intact ENT: Normocephalic CARDIOVASCULAR: Regular rate and rhythm with no adventitious sounds. RESPIRATORY: Normal rate. Clear to auscultation bilaterally.  ENDOCRINE: Normal thyroid.  MUSCULOSKELETAL: Normal ROM, strength equal bilaterally, significant bilat muscle  spasm in neck and shoulders SKIN: Warm, dry without erythema  NEUROLOGICAL: Alert, oriented, CN II-XII grossly intact, Appropriate balance, No dysmetria, Sensation equal bilaterally, Romberg negative.   PSYCH: Normal behavior, mood   Assessment & Plan:  Assessment: Migraine headaches without aura Hormonal Headache  Plan: Continue Phenergan for rescue.  Also: use ice regularly.   Flexeril as first line for HA or muscle spasm.  Sedation precautions.  Can use 1/2 tab.  May use benadryl for HA.   May cause sleepiness.  Reglan for HA and/or nausea.  Do not mix within 3 hours of phenergan.  Can be used  first line.   Percocet to be used for last resort.  Often does not reverse cause of migraine and merely limits pain for a few hours.   Hydrate well.   Follow-up PRN  Paticia Stack, PA-C 04/09/2015 10:27 AM

## 2015-04-23 ENCOUNTER — Telehealth: Payer: Self-pay | Admitting: Physician Assistant

## 2015-04-23 ENCOUNTER — Encounter: Payer: Self-pay | Admitting: Physician Assistant

## 2015-04-23 ENCOUNTER — Ambulatory Visit (INDEPENDENT_AMBULATORY_CARE_PROVIDER_SITE_OTHER): Payer: Medicaid Other | Admitting: Obstetrics & Gynecology

## 2015-04-23 VITALS — BP 110/72 | HR 82 | Wt 185.0 lb

## 2015-04-23 DIAGNOSIS — Z3481 Encounter for supervision of other normal pregnancy, first trimester: Secondary | ICD-10-CM

## 2015-04-23 DIAGNOSIS — Z36 Encounter for antenatal screening of mother: Secondary | ICD-10-CM | POA: Diagnosis not present

## 2015-04-23 NOTE — Telephone Encounter (Signed)
Pt seen by Dr. Hulan Fray for OB appt this am.  She stopped me on the way out asking for curbside consult.  She has had HA ongoing for 5 days now.  She has been using Flexeril, Fioricet and Phenergan.  The pain improves for sometime but always comes back.  She is requesting Prednisone taper.  She used this with previous pregnancy and had good results.   She was given office supply prednisone 20mg  tablets, #10.  She is instructed to take 4 today, 3 tomorrow, 2 on day 3 and 1 on day 4.   Any problems - call the office.

## 2015-04-23 NOTE — Progress Notes (Signed)
Subjective:  Tina Holder is a 30 y.o. G4P3003 at [redacted]w[redacted]d being seen today for ongoing prenatal care.  She is currently monitored for the following issues for this low-risk pregnancy and has ALLERGIC RHINITIS CAUSE UNSPECIFIED; Low birth weight in full term infant, 1750-1999 grams; Hx of preeclampsia, prior pregnancy, currently pregnant; Migraine headache; Asthma; GERD (gastroesophageal reflux disease); Sterilization; and Supervision of other normal pregnancy, antepartum on her problem list.  Patient reports no complaints.  Contractions: Not present. Vag. Bleeding: None.  Movement: Absent. Denies leaking of fluid.   The following portions of the patient's history were reviewed and updated as appropriate: allergies, current medications, past family history, past medical history, past social history, past surgical history and problem list. Problem list updated.  Objective:   Filed Vitals:   04/23/15 1009  BP: 110/72  Pulse: 82  Weight: 185 lb (83.915 kg)    Fetal Status: Fetal Heart Rate (bpm): 156   Movement: Absent     General:  Alert, oriented and cooperative. Patient is in no acute distress.  Skin: Skin is warm and dry. No rash noted.   Cardiovascular: Normal heart rate noted  Respiratory: Normal respiratory effort, no problems with respiration noted  Abdomen: Soft, gravid, appropriate for gestational age. Pain/Pressure: Absent     Pelvic: Vag. Bleeding: None Vag D/C Character: Thin   Cervical exam deferred        Extremities: Normal range of motion.  Edema: None  Mental Status: Normal mood and affect. Normal behavior. Normal judgment and thought content.   Urinalysis:      Assessment and Plan:  Pregnancy: G4P3003 at [redacted]w[redacted]d  1. Supervision of other normal pregnancy, antepartum, first trimester  - [place] MFM OB COMP + 14 WK; Future - Glucose Tolerance, 1 HR (50g)  Preterm labor symptoms and general obstetric precautions including but not limited to vaginal bleeding, contractions,  leaking of fluid and fetal movement were reviewed in detail with the patient. Please refer to After Visit Summary for other counseling recommendations.  Return in about 7 weeks (around 06/11/2015) for for baby scripts.   Emily Filbert, MD

## 2015-04-23 NOTE — Progress Notes (Signed)
BP 100/68 on 03-31-15 on baby scripts.

## 2015-04-24 LAB — GLUCOSE TOLERANCE, 1 HOUR (50G) W/O FASTING: Glucose, 1 Hour GTT: 97 mg/dL (ref 70–140)

## 2015-05-02 ENCOUNTER — Telehealth: Payer: Self-pay | Admitting: *Deleted

## 2015-05-02 DIAGNOSIS — O219 Vomiting of pregnancy, unspecified: Secondary | ICD-10-CM

## 2015-05-02 MED ORDER — DOXYLAMINE-PYRIDOXINE 10-10 MG PO TBEC: DELAYED_RELEASE_TABLET | ORAL | Status: DC

## 2015-05-02 NOTE — Telephone Encounter (Signed)
Patient called and wanted Diclegis sent in.  I have done a PA and sent in the medication to pharmacy.

## 2015-05-03 ENCOUNTER — Telehealth: Payer: Self-pay | Admitting: *Deleted

## 2015-05-03 DIAGNOSIS — M549 Dorsalgia, unspecified: Secondary | ICD-10-CM

## 2015-05-03 NOTE — Telephone Encounter (Signed)
Referral ordered

## 2015-05-03 NOTE — Telephone Encounter (Signed)
-----   Message from Francia Greaves sent at 05/03/2015 10:21 AM EST ----- Regarding: Referral  Wants a referral to see a chiropractor

## 2015-06-04 ENCOUNTER — Ambulatory Visit (HOSPITAL_COMMUNITY)
Admission: RE | Admit: 2015-06-04 | Discharge: 2015-06-04 | Disposition: A | Discharge status: Home / Self Care | Payer: Medicaid Other | Source: Ambulatory Visit | Attending: Obstetrics & Gynecology | Admitting: Obstetrics & Gynecology

## 2015-06-04 DIAGNOSIS — Z3A18 18 weeks gestation of pregnancy: Secondary | ICD-10-CM | POA: Diagnosis not present

## 2015-06-04 DIAGNOSIS — Z36 Encounter for antenatal screening of mother: Secondary | ICD-10-CM | POA: Diagnosis not present

## 2015-06-04 DIAGNOSIS — O09292 Supervision of pregnancy with other poor reproductive or obstetric history, second trimester: Secondary | ICD-10-CM | POA: Diagnosis not present

## 2015-06-04 DIAGNOSIS — Z3481 Encounter for supervision of other normal pregnancy, first trimester: Secondary | ICD-10-CM

## 2015-06-04 DIAGNOSIS — O09299 Supervision of pregnancy with other poor reproductive or obstetric history, unspecified trimester: Secondary | ICD-10-CM

## 2015-06-18 ENCOUNTER — Encounter: Payer: Self-pay | Admitting: Obstetrics & Gynecology

## 2015-06-20 ENCOUNTER — Ambulatory Visit (INDEPENDENT_AMBULATORY_CARE_PROVIDER_SITE_OTHER): Payer: Medicaid Other | Admitting: Family Medicine

## 2015-06-20 VITALS — BP 137/75 | HR 88 | Wt 180.0 lb

## 2015-06-20 DIAGNOSIS — Z3482 Encounter for supervision of other normal pregnancy, second trimester: Secondary | ICD-10-CM

## 2015-06-20 NOTE — Progress Notes (Signed)
Was removed from [facility] due to unable to get a scale that would work for her.   [place] and [place]

## 2015-06-20 NOTE — Patient Instructions (Signed)
Second Trimester of Pregnancy The second trimester is from week 13 through week 28, months 4 through 6. The second trimester is often a time when you feel your best. Your body has also adjusted to being pregnant, and you begin to feel better physically. Usually, morning sickness has lessened or quit completely, you may have more energy, and you may have an increase in appetite. The second trimester is also a time when the fetus is growing rapidly. At the end of the sixth month, the fetus is about 9 inches long and weighs about 1 pounds. You will likely begin to feel the baby move (quickening) between 18 and 20 weeks of the pregnancy. BODY CHANGES Your body goes through many changes during pregnancy. The changes vary from woman to woman.   Your weight will continue to increase. You will notice your lower abdomen bulging out.  You may begin to get stretch marks on your hips, abdomen, and breasts.  You may develop headaches that can be relieved by medicines approved by your health care provider.  You may urinate more often because the fetus is pressing on your bladder.  You may develop or continue to have heartburn as a result of your pregnancy.  You may develop constipation because certain hormones are causing the muscles that push waste through your intestines to slow down.  You may develop hemorrhoids or swollen, bulging veins (varicose veins).  You may have back pain because of the weight gain and pregnancy hormones relaxing your joints between the bones in your pelvis and as a result of a shift in weight and the muscles that support your balance.  Your breasts will continue to grow and be tender.  Your gums may bleed and may be sensitive to brushing and flossing.  Dark spots or blotches (chloasma, mask of pregnancy) may develop on your face. This will likely fade after the baby is born.  A dark line from your belly button to the pubic area (linea nigra) may appear. This will likely  fade after the baby is born.  You may have changes in your hair. These can include thickening of your hair, rapid growth, and changes in texture. Some women also have hair loss during or after pregnancy, or hair that feels dry or thin. Your hair will most likely return to normal after your baby is born. WHAT TO EXPECT AT YOUR PRENATAL VISITS During a routine prenatal visit:  You will be weighed to make sure you and the fetus are growing normally.  Your blood pressure will be taken.  Your abdomen will be measured to track your baby's growth.  The fetal heartbeat will be listened to.  Any test results from the previous visit will be discussed. Your health care provider may ask you:  How you are feeling.  If you are feeling the baby move.  If you have had any abnormal symptoms, such as leaking fluid, bleeding, severe headaches, or abdominal cramping.  If you are using any tobacco products, including cigarettes, chewing tobacco, and electronic cigarettes.  If you have any questions. Other tests that may be performed during your second trimester include:  Blood tests that check for:  Low iron levels (anemia).  Gestational diabetes (between 24 and 28 weeks).  Rh antibodies.  Urine tests to check for infections, diabetes, or protein in the urine.  An ultrasound to confirm the proper growth and development of the baby.  An amniocentesis to check for possible genetic problems.  Fetal screens for spina bifida   and Down syndrome.  HIV (human immunodeficiency virus) testing. Routine prenatal testing includes screening for HIV, unless you choose not to have this test. HOME CARE INSTRUCTIONS   Avoid all smoking, herbs, alcohol, and unprescribed drugs. These chemicals affect the formation and growth of the baby.  Do not use any tobacco products, including cigarettes, chewing tobacco, and electronic cigarettes. If you need help quitting, ask your health care provider. You may receive  counseling support and other resources to help you quit.  Follow your health care provider's instructions regarding medicine use. There are medicines that are either safe or unsafe to take during pregnancy.  Exercise only as directed by your health care provider. Experiencing uterine cramps is a good sign to stop exercising.  Continue to eat regular, healthy meals.  Wear a good support bra for breast tenderness.  Do not use hot tubs, steam rooms, or saunas.  Wear your seat belt at all times when driving.  Avoid raw meat, uncooked cheese, cat litter boxes, and soil used by cats. These carry germs that can cause birth defects in the baby.  Take your prenatal vitamins.  Take 1500-2000 mg of calcium daily starting at the 20th week of pregnancy until you deliver your baby.  Try taking a stool softener (if your health care provider approves) if you develop constipation. Eat more high-fiber foods, such as fresh vegetables or fruit and whole grains. Drink plenty of fluids to keep your urine clear or pale yellow.  Take warm sitz baths to soothe any pain or discomfort caused by hemorrhoids. Use hemorrhoid cream if your health care provider approves.  If you develop varicose veins, wear support hose. Elevate your feet for 15 minutes, 3-4 times a day. Limit salt in your diet.  Avoid heavy lifting, wear low heel shoes, and practice good posture.  Rest with your legs elevated if you have leg cramps or low back pain.  Visit your dentist if you have not gone yet during your pregnancy. Use a soft toothbrush to brush your teeth and be gentle when you floss.  A sexual relationship may be continued unless your health care provider directs you otherwise.  Continue to go to all your prenatal visits as directed by your health care provider. SEEK MEDICAL CARE IF:   You have dizziness.  You have mild pelvic cramps, pelvic pressure, or nagging pain in the abdominal area.  You have persistent nausea,  vomiting, or diarrhea.  You have a bad smelling vaginal discharge.  You have pain with urination. SEEK IMMEDIATE MEDICAL CARE IF:   You have a fever.  You are leaking fluid from your vagina.  You have spotting or bleeding from your vagina.  You have severe abdominal cramping or pain.  You have rapid weight gain or loss.  You have shortness of breath with chest pain.  You notice sudden or extreme swelling of your face, hands, ankles, feet, or legs.  You have not felt your baby move in over an hour.  You have severe headaches that do not go away with medicine.  You have vision changes.   This information is not intended to replace advice given to you by your health care provider. Make sure you discuss any questions you have with your health care provider.   Document Released: 03/10/2001 Document Revised: 04/06/2014 Document Reviewed: 05/17/2012 Elsevier Interactive Patient Education 2016 [employer]   Breastfeeding Deciding to breastfeed is one of the best choices you can make for you and your baby. A change   in hormones during pregnancy causes your breast tissue to grow and increases the number and size of your milk ducts. These hormones also allow proteins, sugars, and fats from your blood supply to make breast milk in your milk-producing glands. Hormones prevent breast milk from being released before your baby is born as well as prompt milk flow after birth. Once breastfeeding has begun, thoughts of your baby, as well as his or her sucking or crying, can stimulate the release of milk from your milk-producing glands.  BENEFITS OF BREASTFEEDING For Your Baby  Your first milk (colostrum) helps your baby's digestive system function better.  There are antibodies in your milk that help your baby fight off infections.  Your baby has a lower incidence of asthma, allergies, and sudden infant death syndrome.  The nutrients in breast milk are better for your baby than infant  formulas and are designed uniquely for your baby's needs.  Breast milk improves your baby's brain development.  Your baby is less likely to develop other conditions, such as childhood obesity, asthma, or type 2 diabetes mellitus. For You  Breastfeeding helps to create a very special bond between you and your baby.  Breastfeeding is convenient. Breast milk is always available at the correct temperature and costs nothing.  Breastfeeding helps to burn calories and helps you lose the weight gained during pregnancy.  Breastfeeding makes your uterus contract to its prepregnancy size faster and slows bleeding (lochia) after you give birth.   Breastfeeding helps to lower your risk of developing type 2 diabetes mellitus, osteoporosis, and breast or ovarian cancer later in life. SIGNS THAT YOUR BABY IS HUNGRY Early Signs of Hunger  Increased alertness or activity.  Stretching.  Movement of the head from side to side.  Movement of the head and opening of the mouth when the corner of the mouth or cheek is stroked (rooting).  Increased sucking sounds, smacking lips, cooing, sighing, or squeaking.  Hand-to-mouth movements.  Increased sucking of fingers or hands. Late Signs of Hunger  Fussing.  Intermittent crying. Extreme Signs of Hunger Signs of extreme hunger will require calming and consoling before your baby will be able to breastfeed successfully. Do not wait for the following signs of extreme hunger to occur before you initiate breastfeeding:  Restlessness.  A loud, strong cry.  Screaming. BREASTFEEDING BASICS Breastfeeding Initiation  Find a comfortable place to sit or lie down, with your neck and back well supported.  Place a pillow or rolled up blanket under your baby to bring him or her to the level of your breast (if you are seated). Nursing pillows are specially designed to help support your arms and your baby while you breastfeed.  Make sure that your baby's  abdomen is facing your abdomen.  Gently massage your breast. With your fingertips, massage from your chest wall toward your nipple in a circular motion. This encourages milk flow. You may need to continue this action during the feeding if your milk flows slowly.  Support your breast with 4 fingers underneath and your thumb above your nipple. Make sure your fingers are well away from your nipple and your baby's mouth.  Stroke your baby's lips gently with your finger or nipple.  When your baby's mouth is open wide enough, quickly bring your baby to your breast, placing your entire nipple and as much of the colored area around your nipple (areola) as possible into your baby's mouth.  More areola should be visible above your baby's upper lip than   below the lower lip.  Your baby's tongue should be between his or her lower gum and your breast.  Ensure that your baby's mouth is correctly positioned around your nipple (latched). Your baby's lips should create a seal on your breast and be turned out (everted).  It is common for your baby to suck about 2-3 minutes in order to start the flow of breast milk. Latching Teaching your baby how to latch on to your breast properly is very important. An improper latch can cause nipple pain and decreased milk supply for you and poor weight gain in your baby. Also, if your baby is not latched onto your nipple properly, he or she may swallow some air during feeding. This can make your baby fussy. Burping your baby when you switch breasts during the feeding can help to get rid of the air. However, teaching your baby to latch on properly is still the best way to prevent fussiness from swallowing air while breastfeeding. Signs that your baby has successfully latched on to your nipple:  Silent tugging or silent sucking, without causing you pain.  Swallowing heard between every 3-4 sucks.  Muscle movement above and in front of his or her ears while sucking. Signs  that your baby has not successfully latched on to nipple:  Sucking sounds or smacking sounds from your baby while breastfeeding.  Nipple pain. If you think your baby has not latched on correctly, slip your finger into the corner of your baby's mouth to break the suction and place it between your baby's gums. Attempt breastfeeding initiation again. Signs of Successful Breastfeeding Signs from your baby:  A gradual decrease in the number of sucks or complete cessation of sucking.  Falling asleep.  Relaxation of his or her body.  Retention of a small amount of milk in his or her mouth.  Letting go of your breast by himself or herself. Signs from you:  Breasts that have increased in firmness, weight, and size 1-3 hours after feeding.  Breasts that are softer immediately after breastfeeding.  Increased milk volume, as well as a change in milk consistency and color by the fifth day of breastfeeding.  Nipples that are not sore, cracked, or bleeding. Signs That Your Baby is Getting Enough Milk  Wetting at least 3 diapers in a 24-hour period. The urine should be clear and pale yellow by age 5 days.  At least 3 stools in a 24-hour period by age 5 days. The stool should be soft and yellow.  At least 3 stools in a 24-hour period by age 7 days. The stool should be seedy and yellow.  No loss of weight greater than 10% of birth weight during the first 3 days of age.  Average weight gain of 4-7 ounces (113-198 g) per week after age 4 days.  Consistent daily weight gain by age 5 days, without weight loss after the age of 2 weeks. After a feeding, your baby may spit up a small amount. This is common. BREASTFEEDING FREQUENCY AND DURATION Frequent feeding will help you make more milk and can prevent sore nipples and breast engorgement. Breastfeed when you feel the need to reduce the fullness of your breasts or when your baby shows signs of hunger. This is called "breastfeeding on demand." Avoid  introducing a pacifier to your baby while you are working to establish breastfeeding (the first 4-6 weeks after your baby is born). After this time you may choose to use a pacifier. Research has shown that   pacifier use during the first year of a baby's life decreases the risk of sudden infant death syndrome (SIDS). Allow your baby to feed on each breast as long as he or she wants. Breastfeed until your baby is finished feeding. When your baby unlatches or falls asleep while feeding from the first breast, offer the second breast. Because newborns are often sleepy in the first few weeks of life, you may need to awaken your baby to get him or her to feed. Breastfeeding times will vary from baby to baby. However, the following rules can serve as a guide to help you ensure that your baby is properly fed:  Newborns (babies 4 weeks of age or younger) may breastfeed every 1-3 hours.  Newborns should not go longer than 3 hours during the day or 5 hours during the night without breastfeeding.  You should breastfeed your baby a minimum of 8 times in a 24-hour period until you begin to introduce solid foods to your baby at around 6 months of age. BREAST MILK PUMPING Pumping and storing breast milk allows you to ensure that your baby is exclusively fed your breast milk, even at times when you are unable to breastfeed. This is especially important if you are going back to work while you are still breastfeeding or when you are not able to be present during feedings. Your lactation consultant can give you guidelines on how long it is safe to store breast milk. A breast pump is a machine that allows you to pump milk from your breast into a sterile bottle. The pumped breast milk can then be stored in a refrigerator or freezer. Some breast pumps are operated by hand, while others use electricity. Ask your lactation consultant which type will work best for you. Breast pumps can be purchased, but some hospitals and  breastfeeding support groups lease breast pumps on a monthly basis. A lactation consultant can teach you how to hand express breast milk, if you prefer not to use a pump. CARING FOR YOUR BREASTS WHILE YOU BREASTFEED Nipples can become dry, cracked, and sore while breastfeeding. The following recommendations can help keep your breasts moisturized and healthy:  Avoid using soap on your nipples.  Wear a supportive bra. Although not required, special nursing bras and tank tops are designed to allow access to your breasts for breastfeeding without taking off your entire bra or top. Avoid wearing underwire-style bras or extremely tight bras.  Air dry your nipples for 3-4minutes after each feeding.  Use only cotton bra pads to absorb leaked breast milk. Leaking of breast milk between feedings is normal.  Use lanolin on your nipples after breastfeeding. Lanolin helps to maintain your skin's normal moisture barrier. If you use pure lanolin, you do not need to wash it off before feeding your baby again. Pure lanolin is not toxic to your baby. You may also hand express a few drops of breast milk and gently massage that milk into your nipples and allow the milk to air dry. In the first few weeks after giving birth, some women experience extremely full breasts (engorgement). Engorgement can make your breasts feel heavy, warm, and tender to the touch. Engorgement peaks within 3-5 days after you give birth. The following recommendations can help ease engorgement:  Completely empty your breasts while breastfeeding or pumping. You may want to start by applying warm, moist heat (in the shower or with warm water-soaked hand towels) just before feeding or pumping. This increases circulation and helps the milk   flow. If your baby does not completely empty your breasts while breastfeeding, pump any extra milk after he or she is finished.  Wear a snug bra (nursing or regular) or tank top for 1-2 days to signal your body  to slightly decrease milk production.  Apply ice packs to your breasts, unless this is too uncomfortable for you.  Make sure that your baby is latched on and positioned properly while breastfeeding. If engorgement persists after 48 hours of following these recommendations, contact your health care provider or a lactation consultant. OVERALL HEALTH CARE RECOMMENDATIONS WHILE BREASTFEEDING  Eat healthy foods. Alternate between meals and snacks, eating 3 of each per day. Because what you eat affects your breast milk, some of the foods may make your baby more irritable than usual. Avoid eating these foods if you are sure that they are negatively affecting your baby.  Drink milk, fruit juice, and water to satisfy your thirst (about 10 glasses a day).  Rest often, relax, and continue to take your prenatal vitamins to prevent fatigue, stress, and anemia.  Continue breast self-awareness checks.  Avoid chewing and smoking tobacco. Chemicals from cigarettes that pass into breast milk and exposure to secondhand smoke may harm your baby.  Avoid alcohol and drug use, including marijuana. Some medicines that may be harmful to your baby can pass through breast milk. It is important to ask your health care provider before taking any medicine, including all over-the-counter and prescription medicine as well as vitamin and herbal supplements. It is possible to become pregnant while breastfeeding. If birth control is desired, ask your health care provider about options that will be safe for your baby. SEEK MEDICAL CARE IF:  You feel like you want to stop breastfeeding or have become frustrated with breastfeeding.  You have painful breasts or nipples.  Your nipples are cracked or bleeding.  Your breasts are red, tender, or warm.  You have a swollen area on either breast.  You have a fever or chills.  You have nausea or vomiting.  You have drainage other than breast milk from your nipples.  Your  breasts do not become full before feedings by the fifth day after you give birth.  You feel sad and depressed.  Your baby is too sleepy to eat well.  Your baby is having trouble sleeping.   Your baby is wetting less than 3 diapers in a 24-hour period.  Your baby has less than 3 stools in a 24-hour period.  Your baby's skin or the white part of his or her eyes becomes yellow.   Your baby is not gaining weight by 5 days of age. SEEK IMMEDIATE MEDICAL CARE IF:  Your baby is overly tired (lethargic) and does not want to wake up and feed.  Your baby develops an unexplained fever.   This information is not intended to replace advice given to you by your health care provider. Make sure you discuss any questions you have with your health care provider.   Document Released: 03/16/2005 Document Revised: 12/05/2014 Document Reviewed: 09/07/2012 Elsevier Interactive Patient Education 2016 [employer]  

## 2015-06-20 NOTE — Progress Notes (Signed)
Subjective:  Tina Holder is a 30 y.o. G4P3003 at [redacted]w[redacted]d being seen today for ongoing prenatal care.  She is currently monitored for the following issues for this low-risk pregnancy and has ALLERGIC RHINITIS CAUSE UNSPECIFIED; Low birth weight in full term infant, 1750-1999 grams; Hx of preeclampsia, prior pregnancy, currently pregnant; Migraine headache; Asthma; GERD (gastroesophageal reflux disease); Sterilization; and Supervision of other normal pregnancy, antepartum on her problem list.  Patient reports no complaints.  Contractions: Not present. Vag. Bleeding: None.  Movement: Present. Denies leaking of fluid.   The following portions of the patient's history were reviewed and updated as appropriate: allergies, current medications, past family history, past medical history, past social history, past surgical history and problem list. Problem list updated.  Objective:   Filed Vitals:   06/20/15 1014  BP: 137/75  Pulse: 88  Weight: 180 lb (81.647 kg)    Fetal Status: Fetal Heart Rate (bpm): 156 Fundal Height: 24 cm Movement: Present     General:  Alert, oriented and cooperative. Patient is in no acute distress.  Skin: Skin is warm and dry. No rash noted.   Cardiovascular: Normal heart rate noted  Respiratory: Normal respiratory effort, no problems with respiration noted  Abdomen: Soft, gravid, appropriate for gestational age. Pain/Pressure: Present     Pelvic: Vag. Bleeding: None     Cervical exam deferred        Extremities: Normal range of motion.  Edema: None  Mental Status: Normal mood and affect. Normal behavior. Normal judgment and thought content.   Urinalysis: Urine Protein: Trace Urine Glucose: Negative  Assessment and Plan:  Pregnancy: G4P3003 at [redacted]w[redacted]d  1. Supervision of other normal pregnancy, antepartum, second trimester Continue routine prenatal care. U/s to complete anatomy - US MFM OB FOLLOW UP; Future  Preterm labor symptoms and general obstetric precautions  including but not limited to vaginal bleeding, contractions, leaking of fluid and fetal movement were reviewed in detail with the patient. Please refer to After Visit Summary for other counseling recommendations.  Return in 4 weeks (on 07/18/2015).   [name], MD

## 2015-06-26 ENCOUNTER — Other Ambulatory Visit: Payer: Self-pay | Admitting: Obstetrics & Gynecology

## 2015-06-26 ENCOUNTER — Other Ambulatory Visit: Payer: Self-pay | Admitting: Family Medicine

## 2015-07-10 ENCOUNTER — Ambulatory Visit (HOSPITAL_COMMUNITY): Payer: Medicaid Other

## 2015-07-12 ENCOUNTER — Encounter: Payer: Self-pay | Admitting: Emergency Medicine

## 2015-07-12 ENCOUNTER — Emergency Department
Admission: EM | Admit: 2015-07-12 | Discharge: 2015-07-12 | Disposition: A | Discharge status: Home / Self Care | Payer: Medicaid Other | Attending: Emergency Medicine | Admitting: Emergency Medicine

## 2015-07-12 DIAGNOSIS — Z3A24 24 weeks gestation of pregnancy: Secondary | ICD-10-CM | POA: Insufficient documentation

## 2015-07-12 DIAGNOSIS — O132 Gestational [pregnancy-induced] hypertension without significant proteinuria, second trimester: Secondary | ICD-10-CM | POA: Diagnosis not present

## 2015-07-12 DIAGNOSIS — G43909 Migraine, unspecified, not intractable, without status migrainosus: Secondary | ICD-10-CM | POA: Insufficient documentation

## 2015-07-12 DIAGNOSIS — K219 Gastro-esophageal reflux disease without esophagitis: Secondary | ICD-10-CM | POA: Insufficient documentation

## 2015-07-12 DIAGNOSIS — K115 Sialolithiasis: Secondary | ICD-10-CM | POA: Diagnosis not present

## 2015-07-12 DIAGNOSIS — J45909 Unspecified asthma, uncomplicated: Secondary | ICD-10-CM | POA: Insufficient documentation

## 2015-07-12 DIAGNOSIS — O99612 Diseases of the digestive system complicating pregnancy, second trimester: Secondary | ICD-10-CM | POA: Insufficient documentation

## 2015-07-12 DIAGNOSIS — R591 Generalized enlarged lymph nodes: Secondary | ICD-10-CM | POA: Diagnosis present

## 2015-07-12 LAB — BASIC METABOLIC PANEL
Anion gap: 7 (ref 5–15)
BUN: 10 mg/dL (ref 6–20)
CALCIUM: 8.6 mg/dL — AB (ref 8.9–10.3)
CO2: 23 mmol/L (ref 22–32)
CREATININE: 0.77 mg/dL (ref 0.44–1.00)
Chloride: 103 mmol/L (ref 101–111)
GFR calc non Af Amer: >60 mL/min (ref >60)
Glucose, Bld: 93 mg/dL (ref 65–99)
Potassium: 3.4 mmol/L — ABNORMAL LOW (ref 3.5–5.1)
Sodium: 133 mmol/L — ABNORMAL LOW (ref 135–145)

## 2015-07-12 LAB — CBC WITH DIFFERENTIAL/PLATELET
BASOS PCT: 0 %
Basophils Absolute: 0 10*3/uL (ref 0–0.1)
EOS ABS: 0 10*3/uL (ref 0–0.7)
EOS PCT: 0 %
HCT: 36.2 % (ref 35.0–47.0)
HEMOGLOBIN: 13 g/dL (ref 12.0–16.0)
LYMPHS ABS: 0.6 10*3/uL — AB (ref 1.0–3.6)
Lymphocytes Relative: 7 %
MCH: 33 pg (ref 26.0–34.0)
MCHC: 36 g/dL (ref 32.0–36.0)
MCV: 91.8 fL (ref 80.0–100.0)
MONO ABS: 0.6 10*3/uL (ref 0.2–0.9)
MONOS PCT: 7 %
NEUTROS PCT: 86 %
Neutro Abs: 7.2 10*3/uL — ABNORMAL HIGH (ref 1.4–6.5)
PLATELETS: 159 10*3/uL (ref 150–440)
RBC: 3.94 MIL/uL (ref 3.80–5.20)
RDW: 13.2 % (ref 11.5–14.5)
WBC: 8.4 10*3/uL (ref 3.6–11.0)

## 2015-07-12 LAB — POCT RAPID STREP A: STREPTOCOCCUS, GROUP A SCREEN (DIRECT): NEGATIVE

## 2015-07-12 MED ORDER — AMOXICILLIN-POT CLAVULANATE 875-125 MG PO TABS: 1.0000 | ORAL_TABLET | Freq: Two times a day (BID) | ORAL | Status: AC

## 2015-07-12 NOTE — ED Provider Notes (Signed)
[facility] Emergency Department Provider Note  ____________________________________________  Time seen: Approximately 10:24 AM  I have reviewed the triage vital signs and the nursing notes.   HISTORY  Chief Complaint Lymphadenopathy   HPI Tina Holder is a 30 y.o. female is here with complaint of swelling to the right side of her neck since yesterday. She states that there is pain going from her neck into her right ear. She denies any fever.Patient has continued to be drink without any difficulty. She is unaware of any fever. Patient currently is [redacted] weeks pregnant. She has not taken any over-the-counter medication for this. Currently she rates her pain an 8 out of 10.   Past Medical History  Diagnosis Date  . Headache(784.0)   . Pregnancy induced hypertension   . Obesity     Patient Active Problem List   Diagnosis Date Noted  . Supervision of other normal pregnancy, antepartum 03/27/2015  . Sterilization 01/01/2012  . GERD (gastroesophageal reflux disease) 11/25/2011  . Asthma 09/22/2011  . Low birth weight in full term infant, 1750-1999 grams 06/10/2011  . Hx of preeclampsia, prior pregnancy, currently pregnant 06/10/2011  . Migraine headache 06/10/2011  . ALLERGIC RHINITIS CAUSE UNSPECIFIED 06/01/2010    Past Surgical History  Procedure Laterality Date  . Tonsillectomy  2005  . Wisdom tooth extraction    . Tubal ligation  01/01/2012    Procedure: POST PARTUM TUBAL LIGATION;  Surgeon: Lavonia Drafts, MD;  Location: [facility];  Service: Gynecology;  Laterality: Bilateral;  with filshie clips    Current Outpatient Rx  Name  Route  Sig  Dispense  Refill  . EXPIRED: albuterol (PROVENTIL HFA;VENTOLIN HFA) 108 (90 BASE) MCG/ACT inhaler   Inhalation   Inhale 2 puffs into the lungs every 6 (six) hours as needed for wheezing.   1 Inhaler   2   . amoxicillin-clavulanate (AUGMENTIN) 875-125 MG tablet   Oral   Take 1 tablet by mouth 2 (two)  times daily.   20 tablet   0   . butalbital-acetaminophen-caffeine (FIORICET, ESGIC) 50-325-40 MG tablet      TAKE 1 TO 2 TABLETS EVERY 6 HOURS AS NEEDED FOR HEADACHE   30 tablet   1     PLEASE SEND REFILLSTHANK YOU   . Pediatric Multivit-Minerals-C (CHILDRENS GUMMIES PO)   Oral   Take by mouth.           Allergies Review of patient's allergies indicates no known allergies.  Family History  Problem Relation Age of Onset  . Arthritis Mother   . Thyroid disease Maternal Grandmother   . Lupus Maternal Grandmother     Social History Social History  Substance Use Topics  . Smoking status: Never Smoker   . Smokeless tobacco: Never Used  . Alcohol Use: No    Review of Systems Constitutional: No fever/chills ENT: Positive sore throat.  Cardiovascular: Denies chest pain. Respiratory: Denies shortness of breath. Gastrointestinal:   No nausea, no vomiting.   Musculoskeletal: Negative for back pain. Skin: Negative for rash. Neurological: Negative for headaches, focal weakness or numbness. Hematological/Lymphatic:Positive lymph node. 10-point ROS otherwise negative.  ____________________________________________   PHYSICAL EXAM:  VITAL SIGNS: ED Triage Vitals  Enc Vitals Group     BP 07/12/15 0947 101/79 mmHg     Pulse Rate 07/12/15 0947 94     Resp 07/12/15 0947 16     Temp 07/12/15 0947 98.3 F (36.8 C)     Temp Source 07/12/15 0947 Oral  SpO2 07/12/15 0947 100 %     Weight 07/12/15 0947 180 lb (81.647 kg)     Height 07/12/15 0947 5\' 2"  (1.575 m)     Head Cir --      Peak Flow --      Pain Score 07/12/15 0951 8     Pain Loc --      Pain Edu? --      Excl. in [place]? --     Constitutional: Alert and oriented. Well appearing and in no acute distress. Eyes: Conjunctivae are normal. PERRL. EOMI. Head: Atraumatic. Nose: No congestion/rhinnorhea. Mouth/Throat: Mucous membranes are moist.  Oropharynx non-erythematous. Neck: No stridor. Supple. There is a  soft tissue fullness on the right side only. Area is moderately tender to palpation. There is no warmth or redness to the area. Range of motion is unrestricted. Hematological/Lymphatic/Immunilogical: Positive right cervical lymphadenopathy. Cardiovascular: Normal rate, regular rhythm. Grossly normal heart sounds.  Good peripheral circulation. Respiratory: Normal respiratory effort.  No retractions. Lungs CTAB. Musculoskeletal: Moves upper and lower extremities without any difficulty. Normal gait was noted. Neurologic:  Normal speech and language. No gross focal neurologic deficits are appreciated. No gait instability. Skin:  Skin is warm, dry and intact. No rash noted. Psychiatric: Mood and affect are normal. Speech and behavior are normal.  ____________________________________________   LABS (all labs ordered are listed, but only abnormal results are displayed)  Labs Reviewed  BASIC METABOLIC PANEL - Abnormal; Notable for the following:    Sodium 133 (*)    Potassium 3.4 (*)    Calcium 8.6 (*)    All other components within normal limits  CBC WITH DIFFERENTIAL/PLATELET - Abnormal; Notable for the following:    Neutro Abs 7.2 (*)    Lymphs Abs 0.6 (*)    All other components within normal limits  POCT RAPID STREP A     PROCEDURES  Procedure(s) performed: None  Critical Care performed: No  ____________________________________________   INITIAL IMPRESSION / ASSESSMENT AND PLAN / ED COURSE  Pertinent labs & imaging results that were available during my care of the patient were reviewed by me and considered in my medical decision making (see chart for details).  After discussing the patient's case with Dr. Cinda Quest who was decided that we would treat for multiple salivary duct stone. Patient is encouraged to use lemon drops or sour candy to increase saliva production. She was started on Augmentin 875 twice a day for 10 days and Tylenol sparingly if needed for pain. Patient is  follow-up with [facility] if any continued problems. She is also to return to the emergency room due to the holiday weekend if there is any significant worsening or urgent concerns. ____________________________________________   FINAL CLINICAL IMPRESSION(S) / ED DIAGNOSES  Final diagnoses:  Stone, salivary duct      Johnn Hai, PA-C 07/12/15 1538  Orbie Pyo, MD 07/18/15 [phone]

## 2015-07-12 NOTE — ED Notes (Signed)
See triage note. Developed some swelling to right side of neck yesterday. Pain is going from neck into right ear   Denies any fever

## 2015-07-12 NOTE — ED Notes (Addendum)
Pt reports swollen lymph node since yesterday morning; reports pain is increasing. Pt also reports she is [redacted] weeks pregnant.

## 2015-07-12 NOTE — Discharge Instructions (Signed)
Salivary Stone A salivary stone is a mineral deposit that builds up in the ducts that drain your salivary glands. Most salivary gland stones are made of calcium. When a stone forms, saliva can back up into the gland and cause painful swelling. Your salivary glands are the glands that produce spit (saliva). You have six major salivary glands. Each gland has a duct that carries saliva into your mouth. Saliva keeps your mouth moist and breaks down the food that you eat. It also helps to prevent tooth decay. Two salivary glands are located just in front of your ears (parotid). The ducts for these glands open up inside your cheeks, near your back teeth. You also have two glands under your tongue (sublingual) and two glands under your jaw (submandibular). The ducts for these glands open under your tongue. A stone can form in any salivary gland. The most common place for a salivary stone to develop is in a submandibular salivary gland. CAUSES Any condition that reduces the flow of saliva may lead to stone formation. It is not known why some people form stones and others do not.  RISK FACTORS You may be more likely to develop a salivary stone if you:  Are female.  Do not drink enough water.  Smoke.  Have high blood pressure.  Have gout.  Have diabetes. SIGNS AND SYMPTOMS The main sign of a salivary gland stone is sudden swelling of a salivary gland when eating. This usually happens under the jaw on one side. Other signs and symptoms include:  Swelling of the cheek or under the tongue when eating.  Pain in the swollen area.  Trouble chewing or swallowing.  Swelling that goes down after eating. DIAGNOSIS Your health care provider may diagnose a salivary gland stone based on your signs and symptoms. The health care provider will also do a physical exam. In many cases, a stone can be felt in a duct inside your mouth. You may need to see an ear, nose, and throat specialist (ENT or otolaryngologist)  for diagnosis and treatment. You may also need to have diagnostic tests. These may include imaging studies to check for a stone, such as:  X-rays.  Ultrasound.  CT scan.  MRI. TREATMENT Home care may be enough to treat a small stone that is not causing symptoms. Treatment of a stone that is large enough to cause symptoms may include:  Probing and widening the duct to allow the stone to pass.  Inserting a thin, flexible scope (endoscope) into the duct to locate and remove the stone.  Breaking up the stone with sound waves.  Removing the entire salivary gland. HOME CARE INSTRUCTIONS  Drink enough fluid to keep your urine clear or pale yellow.  Follow these instructions every few hours:  Suck on a lemon candy to stimulate the flow of saliva.  Put a hot compress over the gland.  Gently massage the gland.  Do not use any tobacco products, including cigarettes, chewing tobacco, or electronic cigarettes. If you need help quitting, ask your health care provider. SEEK MEDICAL CARE IF:  You have pain and swelling in your face, jaw, or mouth after eating.  You have persistent swelling in any of these places:  In front of your ear.  Under your jaw.  Inside your mouth. SEEK IMMEDIATE MEDICAL CARE IF:  You have pain and swelling in your face, jaw, or mouth that are getting worse.  Your pain and swelling make it hard to swallow or breathe.  This information is not intended to replace advice given to you by your health care provider. Make sure you discuss any questions you have with your health care provider.   Document Released: 04/23/2004 Document Revised: 04/06/2014 Document Reviewed: 08/16/2013 Elsevier Interactive Patient Education 2016 [place].    Follow up with Dr. Kathyrn Sheriff.  Call for an appointment next week. Begin taking Augmentin as directed. Also suck on lemon drops or any sour candy to increase saliva in her mouth. Return to the emergency room if any  severe worsening or fever over 101.

## 2015-07-18 ENCOUNTER — Ambulatory Visit (INDEPENDENT_AMBULATORY_CARE_PROVIDER_SITE_OTHER): Payer: Medicaid Other | Admitting: Family Medicine

## 2015-07-18 VITALS — BP 97/63 | HR 69 | Wt 181.0 lb

## 2015-07-18 DIAGNOSIS — Z3482 Encounter for supervision of other normal pregnancy, second trimester: Secondary | ICD-10-CM

## 2015-07-18 NOTE — Progress Notes (Signed)
Subjective:  Tina Holder is a 30 y.o. G4P3003 at [redacted]w[redacted]d being seen today for ongoing prenatal care.  She is currently monitored for the following issues for this low-risk pregnancy and has ALLERGIC RHINITIS CAUSE UNSPECIFIED; Low birth weight in full term infant, 1750-1999 grams; Hx of preeclampsia, prior pregnancy, currently pregnant; Migraine headache; Asthma; GERD (gastroesophageal reflux disease); Sterilization; and Supervision of other normal pregnancy, antepartum on her problem list.  Patient reports no complaints.  Contractions: Not present. Vag. Bleeding: None.  Movement: Present. Denies leaking of fluid.   The following portions of the patient's history were reviewed and updated as appropriate: allergies, current medications, past family history, past medical history, past social history, past surgical history and problem list. Problem list updated.  Objective:   Filed Vitals:   07/18/15 1025  BP: 97/63  Pulse: 69  Weight: 181 lb (82.101 kg)    Fetal Status: Fetal Heart Rate (bpm): 147 Fundal Height: 25 cm Movement: Present     General:  Alert, oriented and cooperative. Patient is in no acute distress.  Skin: Skin is warm and dry. No rash noted.   Cardiovascular: Normal heart rate noted  Respiratory: Normal respiratory effort, no problems with respiration noted  Abdomen: Soft, gravid, appropriate for gestational age. Pain/Pressure: Present     Pelvic: Vag. Bleeding: None Vag D/C Character: Thin   Cervical exam deferred        Extremities: Normal range of motion.  Edema: None  Mental Status: Normal mood and affect. Normal behavior. Normal judgment and thought content.    Assessment and Plan:  Pregnancy: G4P3003 at [redacted]w[redacted]d  1. Supervision of other normal pregnancy, antepartum, second trimester For f/u to complete anatomy tomorrow Continue routine prenatal care.  General obstetric precautions including but not limited to vaginal bleeding, contractions, leaking of fluid and  fetal movement were reviewed in detail with the patient. Please refer to After Visit Summary for other counseling recommendations.  .Return in about 4 weeks (around 08/15/2015).   [name], MD

## 2015-07-18 NOTE — Patient Instructions (Signed)
Breastfeeding Deciding to breastfeed is one of the best choices you can make for you and your baby. A change in hormones during pregnancy causes your breast tissue to grow and increases the number and size of your milk ducts. These hormones also allow proteins, sugars, and fats from your blood supply to make breast milk in your milk-producing glands. Hormones prevent breast milk from being released before your baby is born as well as prompt milk flow after birth. Once breastfeeding has begun, thoughts of your baby, as well as his or her sucking or crying, can stimulate the release of milk from your milk-producing glands.  BENEFITS OF BREASTFEEDING For Your Baby  Your first milk (colostrum) helps your baby's digestive system function better.  There are antibodies in your milk that help your baby fight off infections.  Your baby has a lower incidence of asthma, allergies, and sudden infant death syndrome.  The nutrients in breast milk are better for your baby than infant formulas and are designed uniquely for your baby's needs.  Breast milk improves your baby's brain development.  Your baby is less likely to develop other conditions, such as childhood obesity, asthma, or type 2 diabetes mellitus. For You  Breastfeeding helps to create a very special bond between you and your baby.  Breastfeeding is convenient. Breast milk is always available at the correct temperature and costs nothing.  Breastfeeding helps to burn calories and helps you lose the weight gained during pregnancy.  Breastfeeding makes your uterus contract to its prepregnancy size faster and slows bleeding (lochia) after you give birth.   Breastfeeding helps to lower your risk of developing type 2 diabetes mellitus, osteoporosis, and breast or ovarian cancer later in life. SIGNS THAT YOUR BABY IS HUNGRY Early Signs of Hunger  Increased alertness or activity.  Stretching.  Movement of the head from side to  side.  Movement of the head and opening of the mouth when the corner of the mouth or cheek is stroked (rooting).  Increased sucking sounds, smacking lips, cooing, sighing, or squeaking.  Hand-to-mouth movements.  Increased sucking of fingers or hands. Late Signs of Hunger  Fussing.  Intermittent crying. Extreme Signs of Hunger Signs of extreme hunger will require calming and consoling before your baby will be able to breastfeed successfully. Do not wait for the following signs of extreme hunger to occur before you initiate breastfeeding:  Restlessness.  A loud, strong cry.  Screaming. BREASTFEEDING BASICS Breastfeeding Initiation  Find a comfortable place to sit or lie down, with your neck and back well supported.  Place a pillow or rolled up blanket under your baby to bring him or her to the level of your breast (if you are seated). Nursing pillows are specially designed to help support your arms and your baby while you breastfeed.  Make sure that your baby's abdomen is facing your abdomen.  Gently massage your breast. With your fingertips, massage from your chest wall toward your nipple in a circular motion. This encourages milk flow. You may need to continue this action during the feeding if your milk flows slowly.  Support your breast with 4 fingers underneath and your thumb above your nipple. Make sure your fingers are well away from your nipple and your baby's mouth.  Stroke your baby's lips gently with your finger or nipple.  When your baby's mouth is open wide enough, quickly bring your baby to your breast, placing your entire nipple and as much of the colored area around your nipple (  areola) as possible into your baby's mouth.  More areola should be visible above your baby's upper lip than below the lower lip.  Your baby's tongue should be between his or her lower gum and your breast.  Ensure that your baby's mouth is correctly positioned around your nipple  (latched). Your baby's lips should create a seal on your breast and be turned out (everted).  It is common for your baby to suck about 2-3 minutes in order to start the flow of breast milk. Latching Teaching your baby how to latch on to your breast properly is very important. An improper latch can cause nipple pain and decreased milk supply for you and poor weight gain in your baby. Also, if your baby is not latched onto your nipple properly, he or she may swallow some air during feeding. This can make your baby fussy. Burping your baby when you switch breasts during the feeding can help to get rid of the air. However, teaching your baby to latch on properly is still the best way to prevent fussiness from swallowing air while breastfeeding. Signs that your baby has successfully latched on to your nipple:  Silent tugging or silent sucking, without causing you pain.  Swallowing heard between every 3-4 sucks.  Muscle movement above and in front of his or her ears while sucking. Signs that your baby has not successfully latched on to nipple:  Sucking sounds or smacking sounds from your baby while breastfeeding.  Nipple pain. If you think your baby has not latched on correctly, slip your finger into the corner of your baby's mouth to break the suction and place it between your baby's gums. Attempt breastfeeding initiation again. Signs of Successful Breastfeeding Signs from your baby:  A gradual decrease in the number of sucks or complete cessation of sucking.  Falling asleep.  Relaxation of his or her body.  Retention of a small amount of milk in his or her mouth.  Letting go of your breast by himself or herself. Signs from you:  Breasts that have increased in firmness, weight, and size 1-3 hours after feeding.  Breasts that are softer immediately after breastfeeding.  Increased milk volume, as well as a change in milk consistency and color by the fifth day of breastfeeding.  Nipples  that are not sore, cracked, or bleeding. Signs That Your Baby is Getting Enough Milk  Wetting at least 3 diapers in a 24-hour period. The urine should be clear and pale yellow by age 5 days.  At least 3 stools in a 24-hour period by age 5 days. The stool should be soft and yellow.  At least 3 stools in a 24-hour period by age 7 days. The stool should be seedy and yellow.  No loss of weight greater than 10% of birth weight during the first 3 days of age.  Average weight gain of 4-7 ounces (113-198 g) per week after age 4 days.  Consistent daily weight gain by age 5 days, without weight loss after the age of 2 weeks. After a feeding, your baby may spit up a small amount. This is common. BREASTFEEDING FREQUENCY AND DURATION Frequent feeding will help you make more milk and can prevent sore nipples and breast engorgement. Breastfeed when you feel the need to reduce the fullness of your breasts or when your baby shows signs of hunger. This is called "breastfeeding on demand." Avoid introducing a pacifier to your baby while you are working to establish breastfeeding (the first 4-6 weeks   after your baby is born). After this time you may choose to use a pacifier. Research has shown that pacifier use during the first year of a baby's life decreases the risk of sudden infant death syndrome (SIDS). Allow your baby to feed on each breast as long as he or she wants. Breastfeed until your baby is finished feeding. When your baby unlatches or falls asleep while feeding from the first breast, offer the second breast. Because newborns are often sleepy in the first few weeks of life, you may need to awaken your baby to get him or her to feed. Breastfeeding times will vary from baby to baby. However, the following rules can serve as a guide to help you ensure that your baby is properly fed:  Newborns (babies 4 weeks of age or younger) may breastfeed every 1-3 hours.  Newborns should not go longer than 3 hours  during the day or 5 hours during the night without breastfeeding.  You should breastfeed your baby a minimum of 8 times in a 24-hour period until you begin to introduce solid foods to your baby at around 6 months of age. BREAST MILK PUMPING Pumping and storing breast milk allows you to ensure that your baby is exclusively fed your breast milk, even at times when you are unable to breastfeed. This is especially important if you are going back to work while you are still breastfeeding or when you are not able to be present during feedings. Your lactation consultant can give you guidelines on how long it is safe to store breast milk. A breast pump is a machine that allows you to pump milk from your breast into a sterile bottle. The pumped breast milk can then be stored in a refrigerator or freezer. Some breast pumps are operated by hand, while others use electricity. Ask your lactation consultant which type will work best for you. Breast pumps can be purchased, but some hospitals and breastfeeding support groups lease breast pumps on a monthly basis. A lactation consultant can teach you how to hand express breast milk, if you prefer not to use a pump. CARING FOR YOUR BREASTS WHILE YOU BREASTFEED Nipples can become dry, cracked, and sore while breastfeeding. The following recommendations can help keep your breasts moisturized and healthy:  Avoid using soap on your nipples.  Wear a supportive bra. Although not required, special nursing bras and tank tops are designed to allow access to your breasts for breastfeeding without taking off your entire bra or top. Avoid wearing underwire-style bras or extremely tight bras.  Air dry your nipples for 3-4minutes after each feeding.  Use only cotton bra pads to absorb leaked breast milk. Leaking of breast milk between feedings is normal.  Use lanolin on your nipples after breastfeeding. Lanolin helps to maintain your skin's normal moisture barrier. If you use  pure lanolin, you do not need to wash it off before feeding your baby again. Pure lanolin is not toxic to your baby. You may also hand express a few drops of breast milk and gently massage that milk into your nipples and allow the milk to air dry. In the first few weeks after giving birth, some women experience extremely full breasts (engorgement). Engorgement can make your breasts feel heavy, warm, and tender to the touch. Engorgement peaks within 3-5 days after you give birth. The following recommendations can help ease engorgement:  Completely empty your breasts while breastfeeding or pumping. You may want to start by applying warm, moist heat (in   the shower or with warm water-soaked hand towels) just before feeding or pumping. This increases circulation and helps the milk flow. If your baby does not completely empty your breasts while breastfeeding, pump any extra milk after he or she is finished.  Wear a snug bra (nursing or regular) or tank top for 1-2 days to signal your body to slightly decrease milk production.  Apply ice packs to your breasts, unless this is too uncomfortable for you.  Make sure that your baby is latched on and positioned properly while breastfeeding. If engorgement persists after 48 hours of following these recommendations, contact your health care provider or a lactation consultant. OVERALL HEALTH CARE RECOMMENDATIONS WHILE BREASTFEEDING  Eat healthy foods. Alternate between meals and snacks, eating 3 of each per day. Because what you eat affects your breast milk, some of the foods may make your baby more irritable than usual. Avoid eating these foods if you are sure that they are negatively affecting your baby.  Drink milk, fruit juice, and water to satisfy your thirst (about 10 glasses a day).  Rest often, relax, and continue to take your prenatal vitamins to prevent fatigue, stress, and anemia.  Continue breast self-awareness checks.  Avoid chewing and smoking  tobacco. Chemicals from cigarettes that pass into breast milk and exposure to secondhand smoke may harm your baby.  Avoid alcohol and drug use, including marijuana. Some medicines that may be harmful to your baby can pass through breast milk. It is important to ask your health care provider before taking any medicine, including all over-the-counter and prescription medicine as well as vitamin and herbal supplements. It is possible to become pregnant while breastfeeding. If birth control is desired, ask your health care provider about options that will be safe for your baby. SEEK MEDICAL CARE IF:  You feel like you want to stop breastfeeding or have become frustrated with breastfeeding.  You have painful breasts or nipples.  Your nipples are cracked or bleeding.  Your breasts are red, tender, or warm.  You have a swollen area on either breast.  You have a fever or chills.  You have nausea or vomiting.  You have drainage other than breast milk from your nipples.  Your breasts do not become full before feedings by the fifth day after you give birth.  You feel sad and depressed.  Your baby is too sleepy to eat well.  Your baby is having trouble sleeping.   Your baby is wetting less than 3 diapers in a 24-hour period.  Your baby has less than 3 stools in a 24-hour period.  Your baby's skin or the white part of his or her eyes becomes yellow.   Your baby is not gaining weight by 5 days of age. SEEK IMMEDIATE MEDICAL CARE IF:  Your baby is overly tired (lethargic) and does not want to wake up and feed.  Your baby develops an unexplained fever.   This information is not intended to replace advice given to you by your health care provider. Make sure you discuss any questions you have with your health care provider.   Document Released: 03/16/2005 Document Revised: 12/05/2014 Document Reviewed: 09/07/2012 Elsevier Interactive Patient Education 2016 [employer]  

## 2015-07-19 ENCOUNTER — Ambulatory Visit (HOSPITAL_COMMUNITY)
Admission: RE | Admit: 2015-07-19 | Discharge: 2015-07-19 | Disposition: A | Discharge status: Home / Self Care | Payer: Medicaid Other | Source: Ambulatory Visit | Attending: Family Medicine | Admitting: Family Medicine

## 2015-07-19 ENCOUNTER — Other Ambulatory Visit: Payer: Self-pay | Admitting: Family Medicine

## 2015-07-19 DIAGNOSIS — Z3A25 25 weeks gestation of pregnancy: Secondary | ICD-10-CM

## 2015-07-19 DIAGNOSIS — Z3482 Encounter for supervision of other normal pregnancy, second trimester: Secondary | ICD-10-CM

## 2015-07-19 DIAGNOSIS — O09292 Supervision of pregnancy with other poor reproductive or obstetric history, second trimester: Secondary | ICD-10-CM | POA: Insufficient documentation

## 2015-07-19 DIAGNOSIS — Z36 Encounter for antenatal screening of mother: Secondary | ICD-10-CM | POA: Diagnosis not present

## 2015-07-19 DIAGNOSIS — O09892 Supervision of other high risk pregnancies, second trimester: Secondary | ICD-10-CM

## 2015-07-19 DIAGNOSIS — Z363 Encounter for antenatal screening for malformations: Secondary | ICD-10-CM

## 2015-08-12 ENCOUNTER — Ambulatory Visit (INDEPENDENT_AMBULATORY_CARE_PROVIDER_SITE_OTHER): Payer: Medicaid Other | Admitting: Obstetrics & Gynecology

## 2015-08-12 VITALS — BP 103/66 | HR 80 | Wt 184.4 lb

## 2015-08-12 DIAGNOSIS — Z3482 Encounter for supervision of other normal pregnancy, second trimester: Secondary | ICD-10-CM

## 2015-08-12 DIAGNOSIS — Z36 Encounter for antenatal screening of mother: Secondary | ICD-10-CM | POA: Diagnosis not present

## 2015-08-12 DIAGNOSIS — Z23 Encounter for immunization: Secondary | ICD-10-CM

## 2015-08-12 LAB — CBC
HEMATOCRIT: 34.6 % — AB (ref 35.0–45.0)
HEMOGLOBIN: 12 g/dL (ref 11.7–15.5)
MCH: 31.9 pg (ref 27.0–33.0)
MCHC: 34.7 g/dL (ref 32.0–36.0)
MCV: 92 fL (ref 80.0–100.0)
MPV: 11.1 fL (ref 7.5–12.5)
Platelets: 169 10*3/uL (ref 140–400)
RBC: 3.76 MIL/uL — AB (ref 3.80–5.10)
RDW: 13.2 % (ref 11.0–15.0)
WBC: 6.5 10*3/uL (ref 3.8–10.8)

## 2015-08-12 NOTE — Progress Notes (Signed)
Subjective:  Tina Holder is a 30 y.o. G4P3003 at [redacted]w[redacted]d being seen today for ongoing prenatal care.  She is currently monitored for the following issues for this low-risk pregnancy and has ALLERGIC RHINITIS CAUSE UNSPECIFIED; Low birth weight in full term infant, 1750-1999 grams; Hx of preeclampsia, prior pregnancy, currently pregnant; Migraine headache; Asthma; GERD (gastroesophageal reflux disease); Sterilization; and Supervision of other normal pregnancy, antepartum on her problem list.  Patient reports no complaints.  Contractions: Not present. Vag. Bleeding: None.  Movement: Present. Denies leaking of fluid.   The following portions of the patient's history were reviewed and updated as appropriate: allergies, current medications, past family history, past medical history, past social history, past surgical history and problem list. Problem list updated.  Objective:   Filed Vitals:   08/12/15 1404  BP: 103/66  Pulse: 80  Weight: 184 lb 6.4 oz (83.643 kg)    Fetal Status: Fetal Heart Rate (bpm): 131 Fundal Height: 28 cm Movement: Present     General:  Alert, oriented and cooperative. Patient is in no acute distress.  Skin: Skin is warm and dry. No rash noted.   Cardiovascular: Normal heart rate noted  Respiratory: Normal respiratory effort, no problems with respiration noted  Abdomen: Soft, gravid, appropriate for gestational age. Pain/Pressure: Absent     Pelvic: Vag. Bleeding: None Vag D/C Character: Thin  Cervical exam deferred        Extremities: Normal range of motion.  Edema: None  Mental Status: Normal mood and affect. Normal behavior. Normal judgment and thought content.   Urinalysis: Urine Protein: Negative Urine Glucose: Negative  Assessment and Plan:  Pregnancy: G4P3003 at [redacted]w[redacted]d  Supervision of other normal pregnancy, antepartum, second trimester Third trimester labs done today. - Tdap vaccine greater than or equal to 7yo IM - Glucose Tolerance, 1 HR (50g) - CBC -  RPR - HIV antibody Preterm labor symptoms and general obstetric precautions including but not limited to vaginal bleeding, contractions, leaking of fluid and fetal movement were reviewed in detail with the patient. Please refer to After Visit Summary for other counseling recommendations.  Return in about 2 weeks (around 08/26/2015) for OB Visit.   Osborne Oman, MD

## 2015-08-12 NOTE — Patient Instructions (Signed)
Return to clinic for any scheduled appointments or obstetric concerns, or go to [facility] for evaluation  

## 2015-08-13 ENCOUNTER — Encounter: Payer: Self-pay | Admitting: *Deleted

## 2015-08-13 LAB — GLUCOSE TOLERANCE, 1 HOUR (50G) W/O FASTING: GLUCOSE, 1 HR, GESTATIONAL: 116 mg/dL (ref <140)

## 2015-08-13 LAB — HIV ANTIBODY (ROUTINE TESTING W REFLEX): HIV 1&2 Ab, 4th Generation: NONREACTIVE

## 2015-08-14 ENCOUNTER — Telehealth: Payer: Self-pay | Admitting: *Deleted

## 2015-08-14 LAB — RPR

## 2015-08-14 NOTE — Telephone Encounter (Signed)
Pt called in stating she has hd severe migraine for 2 days with no releif after taking Fioricet

## 2015-08-14 NOTE — Telephone Encounter (Signed)
Pt called in stating she has hd severe migraine for 2 days with no relief after taking fioricet, muscle relaxers, benadryl, and phenergan and wanted to know if there was something else that could be called in to her pharmacy. Advised pt to report to [facility] for eval if meds already Rx'd not working. Pt expressed understanding.

## 2015-08-23 ENCOUNTER — Encounter: Payer: Medicaid Other | Admitting: Physician Assistant

## 2015-08-28 ENCOUNTER — Other Ambulatory Visit: Payer: Self-pay | Admitting: *Deleted

## 2015-08-28 DIAGNOSIS — J4520 Mild intermittent asthma, uncomplicated: Secondary | ICD-10-CM

## 2015-08-28 NOTE — Telephone Encounter (Signed)
Requesting refill on Albuterol inhaler. Routed message to Dr Kennon Rounds.

## 2015-08-29 ENCOUNTER — Encounter: Payer: Medicaid Other | Admitting: Obstetrics & Gynecology

## 2015-08-29 MED ORDER — ALBUTEROL SULFATE HFA 108 (90 BASE) MCG/ACT IN AERS
2.0000 | INHALATION_SPRAY | Freq: Four times a day (QID) | RESPIRATORY_TRACT | Status: DC | PRN
Start: 2015-08-29 — End: 2018-03-31

## 2015-09-06 ENCOUNTER — Ambulatory Visit (INDEPENDENT_AMBULATORY_CARE_PROVIDER_SITE_OTHER): Payer: Medicaid Other | Admitting: Physician Assistant

## 2015-09-06 ENCOUNTER — Ambulatory Visit (INDEPENDENT_AMBULATORY_CARE_PROVIDER_SITE_OTHER): Payer: Medicaid Other | Admitting: Obstetrics and Gynecology

## 2015-09-06 VITALS — BP 109/72 | HR 71 | Wt 187.6 lb

## 2015-09-06 DIAGNOSIS — Z3482 Encounter for supervision of other normal pregnancy, second trimester: Secondary | ICD-10-CM

## 2015-09-06 DIAGNOSIS — M62838 Other muscle spasm: Secondary | ICD-10-CM | POA: Diagnosis not present

## 2015-09-06 DIAGNOSIS — G43009 Migraine without aura, not intractable, without status migrainosus: Secondary | ICD-10-CM

## 2015-09-06 DIAGNOSIS — R51 Headache: Secondary | ICD-10-CM

## 2015-09-06 DIAGNOSIS — O26893 Other specified pregnancy related conditions, third trimester: Secondary | ICD-10-CM | POA: Diagnosis not present

## 2015-09-06 DIAGNOSIS — O09299 Supervision of pregnancy with other poor reproductive or obstetric history, unspecified trimester: Secondary | ICD-10-CM

## 2015-09-06 DIAGNOSIS — R519 Headache, unspecified: Secondary | ICD-10-CM

## 2015-09-06 MED ORDER — METOCLOPRAMIDE HCL 10 MG PO TABS: 10.0000 mg | ORAL_TABLET | Freq: Three times a day (TID) | ORAL | Status: DC | PRN

## 2015-09-06 MED ORDER — BUTALBITAL-APAP-CAFFEINE 50-325-40 MG PO TABS: ORAL_TABLET | ORAL | Status: DC

## 2015-09-06 NOTE — Progress Notes (Signed)
Patient ID: Tina Holder, female   DOB: 1986/03/14, 30 y.o.   MRN: [id number] History:  Tina Holder is a 30 y.o. [phone] who presents to clinic today for followup of migraine headache.  They have been somewhat better the last 2 weeks but generally worsening throughout the pregnancy.   Steroid taper was helpful x 2 weeks. Medications utilized helpful but needs to take them often.   HIT6: Number of days in the last 4 weeks with:  Severe headache: 3 Moderate headache:0 Mild headache: 9  No headache: 16  Past Medical History  Diagnosis Date  . Headache(784.0)   . Pregnancy induced hypertension   . Obesity     Social History   Social History  . Marital Status: Married    Spouse Name: N/A  . Number of Children: N/A  . Years of Education: N/A   Occupational History  . Not on file.   Social History Main Topics  . Smoking status: Never Smoker   . Smokeless tobacco: Never Used  . Alcohol Use: No  . Drug Use: No  . Sexual Activity: Yes     Comment: tubal   Other Topics Concern  . Not on file   Social History Narrative    Family History  Problem Relation Age of Onset  . Arthritis Mother   . Thyroid disease Maternal Grandmother   . Lupus Maternal Grandmother     No Known Allergies  Current Outpatient Prescriptions on File Prior to Visit  Medication Sig Dispense Refill  . albuterol (PROVENTIL HFA;VENTOLIN HFA) 108 (90 Base) MCG/ACT inhaler Inhale 2 puffs into the lungs every 6 (six) hours as needed for wheezing. 1 Inhaler 2  . butalbital-acetaminophen-caffeine (FIORICET, ESGIC) 50-325-40 MG tablet TAKE 1 TO 2 TABLETS EVERY 6 HOURS AS NEEDED FOR HEADACHE 30 tablet 1  . Pediatric Multivit-Minerals-C (CHILDRENS GUMMIES PO) Take by mouth.    . promethazine (PHENERGAN) 25 MG tablet TAKE ONE TABLET BY MOUTH EVERY 6 HOURS AS NEEDED FOR NAUSEA OR VOMITING 30 tablet 1  . [DISCONTINUED] metoCLOPramide (REGLAN) 10 MG tablet Take 1 tablet (10 mg total) by mouth 3 (three) times  daily as needed for nausea. 30 tablet 3   No current facility-administered medications on file prior to visit.     Review of Systems:  All pertinent positive/negative included in HPI, all other review of systems are negative  Objective:  Physical Exam LMP 12/27/2014 (Approximate) CONSTITUTIONAL: Well-developed, well-nourished female in no acute distress.  EYES: EOM intact ENT: Normocephalic CARDIOVASCULAR: Regular rate and rhythm with no adventitious sounds.  RESPIRATORY: Normal rate. Clear to auscultation bilaterally.  MUSCULOSKELETAL: Normal ROM, strength equal bilaterally, significant muscle spasm noted SKIN: Warm, dry without erythema  NEUROLOGICAL: Alert, oriented, CN II-XII grossly intact, Appropriate balance PSYCH: Normal behavior, mood  PROCEDURE:  TRIGGER POINT INJECTIONS  Procedure: Mixture of 1%  Lidocaine, marcaine and dexamethazone in a ratio of 2:2:1  injected with 1cc each site in bilateral greater Occipital Nerves, bilateral lesser occipital nerves, bilateral cervical paraspinal muscles, bilateral trapezius.  Total amount injected: 12cc.  Pt tolerated procedure well.    Assessment & Plan:  Assessment: 1. Migraine without aura and without status migrainosus, not intractable   2. Muscle spasm   3. Headache in pregnancy, third trimester      Plan: Continue with reglan, flexeril, fioricet for acute pain.  Encouraged more liberal use of flexeril as muscles are exceptionally tight.   Good hydration strongly encouraged.  May continue with chiropractor prn.   Biofreeze, heat,  ice, massage all may be helpful. Follow-up  PRN  Paticia Stack, PA-C 09/06/2015 9:07 AM

## 2015-09-06 NOTE — Progress Notes (Signed)
Subjective:  Tina Holder is a 30 y.o. G4P3003 at [redacted]w[redacted]d being seen today for ongoing prenatal care.  She is currently monitored for the following issues for this low-risk pregnancy and has ALLERGIC RHINITIS CAUSE UNSPECIFIED; Low birth weight in full term infant, 1750-1999 grams; Hx of preeclampsia, prior pregnancy, currently pregnant; Migraine headache; Asthma; GERD (gastroesophageal reflux disease); Sterilization; and Supervision of other normal pregnancy, antepartum on her problem list.  Patient reports no complaints.   Contractions: Not present. Vag. Bleeding: None.  Movement: Present. Denies leaking of fluid.   The following portions of the patient's history were reviewed and updated as appropriate: allergies, current medications, past family history, past medical history, past social history, past surgical history and problem list. Problem list updated.  Objective:   Filed Vitals:   09/06/15 0842  BP: 109/72  Pulse: 71  Weight: 187 lb 9.6 oz (85.095 kg)    Fetal Status: Fetal Heart Rate (bpm): 136 Fundal Height: 32 cm Movement: Present  Presentation: Vertex  General:  Alert, oriented and cooperative. Patient is in no acute distress.  Skin: Skin is warm and dry. No rash noted.   Cardiovascular: Normal heart rate noted  Respiratory: Normal respiratory effort, no problems with respiration noted  Abdomen: Soft, gravid, appropriate for gestational age. Pain/Pressure: Absent     Pelvic:  Cervical exam deferred        Extremities: Normal range of motion.  Edema: Trace  Mental Status: Normal mood and affect. Normal behavior. Normal judgment and thought content.   Urinalysis: Urine Protein: Trace Urine Glucose: Negative  Assessment and Plan:  Pregnancy: G4P3003 at [redacted]w[redacted]d  Routine care. Patient doing well. No s/s of pre-eclampsia.   Preterm labor symptoms and general obstetric precautions including but not limited to vaginal bleeding, contractions, leaking of fluid and fetal movement  were reviewed in detail with the patient. Please refer to After Visit Summary for other counseling recommendations.  Return in about 2 weeks (around 09/20/2015).   Aletha Halim, MD

## 2015-09-19 ENCOUNTER — Ambulatory Visit (INDEPENDENT_AMBULATORY_CARE_PROVIDER_SITE_OTHER): Payer: Medicaid Other | Admitting: Family Medicine

## 2015-09-19 ENCOUNTER — Encounter: Payer: Self-pay | Admitting: Family Medicine

## 2015-09-19 VITALS — BP 106/72 | HR 67 | Wt 190.0 lb

## 2015-09-19 DIAGNOSIS — Z3483 Encounter for supervision of other normal pregnancy, third trimester: Secondary | ICD-10-CM

## 2015-09-19 NOTE — Patient Instructions (Signed)
Third Trimester of Pregnancy The third trimester is from week 29 through week 42, months 7 through 9. The third trimester is a time when the fetus is growing rapidly. At the end of the ninth month, the fetus is about 20 inches in length and weighs 6-10 pounds.  BODY CHANGES Your body goes through many changes during pregnancy. The changes vary from woman to woman.   Your weight will continue to increase. You can expect to gain 25-35 pounds (11-16 kg) by the end of the pregnancy.  You may begin to get stretch marks on your hips, abdomen, and breasts.  You may urinate more often because the fetus is moving lower into your pelvis and pressing on your bladder.  You may develop or continue to have heartburn as a result of your pregnancy.  You may develop constipation because certain hormones are causing the muscles that push waste through your intestines to slow down.  You may develop hemorrhoids or swollen, bulging veins (varicose veins).  You may have pelvic pain because of the weight gain and pregnancy hormones relaxing your joints between the bones in your pelvis. Backaches may result from overexertion of the muscles supporting your posture.  You may have changes in your hair. These can include thickening of your hair, rapid growth, and changes in texture. Some women also have hair loss during or after pregnancy, or hair that feels dry or thin. Your hair will most likely return to normal after your baby is born.  Your breasts will continue to grow and be tender. A yellow discharge may leak from your breasts called colostrum.  Your belly button may stick out.  You may feel short of breath because of your expanding uterus.  You may notice the fetus "dropping," or moving lower in your abdomen.  You may have a bloody mucus discharge. This usually occurs a few days to a week before labor begins.  Your cervix becomes thin and soft (effaced) near your due date. WHAT TO EXPECT AT YOUR  PRENATAL EXAMS  You will have prenatal exams every 2 weeks until week 36. Then, you will have weekly prenatal exams. During a routine prenatal visit:  You will be weighed to make sure you and the fetus are growing normally.  Your blood pressure is taken.  Your abdomen will be measured to track your baby's growth.  The fetal heartbeat will be listened to.  Any test results from the previous visit will be discussed.  You may have a cervical check near your due date to see if you have effaced. At around 36 weeks, your caregiver will check your cervix. At the same time, your caregiver will also perform a test on the secretions of the vaginal tissue. This test is to determine if a type of bacteria, Group B streptococcus, is present. Your caregiver will explain this further. Your caregiver may ask you:  What your birth plan is.  How you are feeling.  If you are feeling the baby move.  If you have had any abnormal symptoms, such as leaking fluid, bleeding, severe headaches, or abdominal cramping.  If you are using any tobacco products, including cigarettes, chewing tobacco, and electronic cigarettes.  If you have any questions. Other tests or screenings that may be performed during your third trimester include:  Blood tests that check for low iron levels (anemia).  Fetal testing to check the health, activity level, and growth of the fetus. Testing is done if you have certain medical conditions or if   there are problems during the pregnancy.  HIV (human immunodeficiency virus) testing. If you are at high risk, you may be screened for HIV during your third trimester of pregnancy. FALSE LABOR You may feel small, irregular contractions that eventually go away. These are called Braxton Hicks contractions, or false labor. Contractions may last for hours, days, or even weeks before true labor sets in. If contractions come at regular intervals, intensify, or become painful, it is best to be seen  by your caregiver.  SIGNS OF LABOR   Menstrual-like cramps.  Contractions that are 5 minutes apart or less.  Contractions that start on the top of the uterus and spread down to the lower abdomen and back.  A sense of increased pelvic pressure or back pain.  A watery or bloody mucus discharge that comes from the vagina. If you have any of these signs before the 37th week of pregnancy, call your caregiver right away. You need to go to the hospital to get checked immediately. HOME CARE INSTRUCTIONS   Avoid all smoking, herbs, alcohol, and unprescribed drugs. These chemicals affect the formation and growth of the baby.  Do not use any tobacco products, including cigarettes, chewing tobacco, and electronic cigarettes. If you need help quitting, ask your health care provider. You may receive counseling support and other resources to help you quit.  Follow your caregiver's instructions regarding medicine use. There are medicines that are either safe or unsafe to take during pregnancy.  Exercise only as directed by your caregiver. Experiencing uterine cramps is a good sign to stop exercising.  Continue to eat regular, healthy meals.  Wear a good support bra for breast tenderness.  Do not use hot tubs, steam rooms, or saunas.  Wear your seat belt at all times when driving.  Avoid raw meat, uncooked cheese, cat litter boxes, and soil used by cats. These carry germs that can cause birth defects in the baby.  Take your prenatal vitamins.  Take 1500-2000 mg of calcium daily starting at the 20th week of pregnancy until you deliver your baby.  Try taking a stool softener (if your caregiver approves) if you develop constipation. Eat more high-fiber foods, such as fresh vegetables or fruit and whole grains. Drink plenty of fluids to keep your urine clear or pale yellow.  Take warm sitz baths to soothe any pain or discomfort caused by hemorrhoids. Use hemorrhoid cream if your caregiver  approves.  If you develop varicose veins, wear support hose. Elevate your feet for 15 minutes, 3-4 times a day. Limit salt in your diet.  Avoid heavy lifting, wear low heal shoes, and practice good posture.  Rest a lot with your legs elevated if you have leg cramps or low back pain.  Visit your dentist if you have not gone during your pregnancy. Use a soft toothbrush to brush your teeth and be gentle when you floss.  A sexual relationship may be continued unless your caregiver directs you otherwise.  Do not travel far distances unless it is absolutely necessary and only with the approval of your caregiver.  Take prenatal classes to understand, practice, and ask questions about the labor and delivery.  Make a trial run to the hospital.  Pack your hospital bag.  Prepare the baby's nursery.  Continue to go to all your prenatal visits as directed by your caregiver. SEEK MEDICAL CARE IF:  You are unsure if you are in labor or if your water has broken.  You have dizziness.  You have   mild pelvic cramps, pelvic pressure, or nagging pain in your abdominal area.  You have persistent nausea, vomiting, or diarrhea.  You have a bad smelling vaginal discharge.  You have pain with urination. SEEK IMMEDIATE MEDICAL CARE IF:   You have a fever.  You are leaking fluid from your vagina.  You have spotting or bleeding from your vagina.  You have severe abdominal cramping or pain.  You have rapid weight loss or gain.  You have shortness of breath with chest pain.  You notice sudden or extreme swelling of your face, hands, ankles, feet, or legs.  You have not felt your baby move in over an hour.  You have severe headaches that do not go away with medicine.  You have vision changes.   This information is not intended to replace advice given to you by your health care provider. Make sure you discuss any questions you have with your health care provider.   Document Released:  03/10/2001 Document Revised: 04/06/2014 Document Reviewed: 05/17/2012 Elsevier Interactive Patient Education 2016 [employer]  Breastfeeding Deciding to breastfeed is one of the best choices you can make for you and your baby. A change in hormones during pregnancy causes your breast tissue to grow and increases the number and size of your milk ducts. These hormones also allow proteins, sugars, and fats from your blood supply to make breast milk in your milk-producing glands. Hormones prevent breast milk from being released before your baby is born as well as prompt milk flow after birth. Once breastfeeding has begun, thoughts of your baby, as well as his or her sucking or crying, can stimulate the release of milk from your milk-producing glands.  BENEFITS OF BREASTFEEDING For Your Baby  Your first milk (colostrum) helps your baby's digestive system function better.  There are antibodies in your milk that help your baby fight off infections.  Your baby has a lower incidence of asthma, allergies, and sudden infant death syndrome.  The nutrients in breast milk are better for your baby than infant formulas and are designed uniquely for your baby's needs.  Breast milk improves your baby's brain development.  Your baby is less likely to develop other conditions, such as childhood obesity, asthma, or type 2 diabetes mellitus. For You  Breastfeeding helps to create a very special bond between you and your baby.  Breastfeeding is convenient. Breast milk is always available at the correct temperature and costs nothing.  Breastfeeding helps to burn calories and helps you lose the weight gained during pregnancy.  Breastfeeding makes your uterus contract to its prepregnancy size faster and slows bleeding (lochia) after you give birth.   Breastfeeding helps to lower your risk of developing type 2 diabetes mellitus, osteoporosis, and breast or ovarian cancer later in life. SIGNS THAT YOUR BABY IS  HUNGRY Early Signs of Hunger  Increased alertness or activity.  Stretching.  Movement of the head from side to side.  Movement of the head and opening of the mouth when the corner of the mouth or cheek is stroked (rooting).  Increased sucking sounds, smacking lips, cooing, sighing, or squeaking.  Hand-to-mouth movements.  Increased sucking of fingers or hands. Late Signs of Hunger  Fussing.  Intermittent crying. Extreme Signs of Hunger Signs of extreme hunger will require calming and consoling before your baby will be able to breastfeed successfully. Do not wait for the following signs of extreme hunger to occur before you initiate breastfeeding:  Restlessness.  A loud, strong cry.  Screaming.   BREASTFEEDING BASICS Breastfeeding Initiation  Find a comfortable place to sit or lie down, with your neck and back well supported.  Place a pillow or rolled up blanket under your baby to bring him or her to the level of your breast (if you are seated). Nursing pillows are specially designed to help support your arms and your baby while you breastfeed.  Make sure that your baby's abdomen is facing your abdomen.  Gently massage your breast. With your fingertips, massage from your chest wall toward your nipple in a circular motion. This encourages milk flow. You may need to continue this action during the feeding if your milk flows slowly.  Support your breast with 4 fingers underneath and your thumb above your nipple. Make sure your fingers are well away from your nipple and your baby's mouth.  Stroke your baby's lips gently with your finger or nipple.  When your baby's mouth is open wide enough, quickly bring your baby to your breast, placing your entire nipple and as much of the colored area around your nipple (areola) as possible into your baby's mouth.  More areola should be visible above your baby's upper lip than below the lower lip.  Your baby's tongue should be between his  or her lower gum and your breast.  Ensure that your baby's mouth is correctly positioned around your nipple (latched). Your baby's lips should create a seal on your breast and be turned out (everted).  It is common for your baby to suck about 2-3 minutes in order to start the flow of breast milk. Latching Teaching your baby how to latch on to your breast properly is very important. An improper latch can cause nipple pain and decreased milk supply for you and poor weight gain in your baby. Also, if your baby is not latched onto your nipple properly, he or she may swallow some air during feeding. This can make your baby fussy. Burping your baby when you switch breasts during the feeding can help to get rid of the air. However, teaching your baby to latch on properly is still the best way to prevent fussiness from swallowing air while breastfeeding. Signs that your baby has successfully latched on to your nipple:  Silent tugging or silent sucking, without causing you pain.  Swallowing heard between every 3-4 sucks.  Muscle movement above and in front of his or her ears while sucking. Signs that your baby has not successfully latched on to nipple:  Sucking sounds or smacking sounds from your baby while breastfeeding.  Nipple pain. If you think your baby has not latched on correctly, slip your finger into the corner of your baby's mouth to break the suction and place it between your baby's gums. Attempt breastfeeding initiation again. Signs of Successful Breastfeeding Signs from your baby:  A gradual decrease in the number of sucks or complete cessation of sucking.  Falling asleep.  Relaxation of his or her body.  Retention of a small amount of milk in his or her mouth.  Letting go of your breast by himself or herself. Signs from you:  Breasts that have increased in firmness, weight, and size 1-3 hours after feeding.  Breasts that are softer immediately after  breastfeeding.  Increased milk volume, as well as a change in milk consistency and color by the fifth day of breastfeeding.  Nipples that are not sore, cracked, or bleeding. Signs That Your Baby is Getting Enough Milk  Wetting at least 3 diapers in a 24-hour period.   The urine should be clear and pale yellow by age 5 days.  At least 3 stools in a 24-hour period by age 5 days. The stool should be soft and yellow.  At least 3 stools in a 24-hour period by age 7 days. The stool should be seedy and yellow.  No loss of weight greater than 10% of birth weight during the first 3 days of age.  Average weight gain of 4-7 ounces (113-198 g) per week after age 4 days.  Consistent daily weight gain by age 5 days, without weight loss after the age of 2 weeks. After a feeding, your baby may spit up a small amount. This is common. BREASTFEEDING FREQUENCY AND DURATION Frequent feeding will help you make more milk and can prevent sore nipples and breast engorgement. Breastfeed when you feel the need to reduce the fullness of your breasts or when your baby shows signs of hunger. This is called "breastfeeding on demand." Avoid introducing a pacifier to your baby while you are working to establish breastfeeding (the first 4-6 weeks after your baby is born). After this time you may choose to use a pacifier. Research has shown that pacifier use during the first year of a baby's life decreases the risk of sudden infant death syndrome (SIDS). Allow your baby to feed on each breast as long as he or she wants. Breastfeed until your baby is finished feeding. When your baby unlatches or falls asleep while feeding from the first breast, offer the second breast. Because newborns are often sleepy in the first few weeks of life, you may need to awaken your baby to get him or her to feed. Breastfeeding times will vary from baby to baby. However, the following rules can serve as a guide to help you ensure that your baby is  properly fed:  Newborns (babies 4 weeks of age or younger) may breastfeed every 1-3 hours.  Newborns should not go longer than 3 hours during the day or 5 hours during the night without breastfeeding.  You should breastfeed your baby a minimum of 8 times in a 24-hour period until you begin to introduce solid foods to your baby at around 6 months of age. BREAST MILK PUMPING Pumping and storing breast milk allows you to ensure that your baby is exclusively fed your breast milk, even at times when you are unable to breastfeed. This is especially important if you are going back to work while you are still breastfeeding or when you are not able to be present during feedings. Your lactation consultant can give you guidelines on how long it is safe to store breast milk. A breast pump is a machine that allows you to pump milk from your breast into a sterile bottle. The pumped breast milk can then be stored in a refrigerator or freezer. Some breast pumps are operated by hand, while others use electricity. Ask your lactation consultant which type will work best for you. Breast pumps can be purchased, but some hospitals and breastfeeding support groups lease breast pumps on a monthly basis. A lactation consultant can teach you how to hand express breast milk, if you prefer not to use a pump. CARING FOR YOUR BREASTS WHILE YOU BREASTFEED Nipples can become dry, cracked, and sore while breastfeeding. The following recommendations can help keep your breasts moisturized and healthy:  Avoid using soap on your nipples.  Wear a supportive bra. Although not required, special nursing bras and tank tops are designed to allow access to your   breasts for breastfeeding without taking off your entire bra or top. Avoid wearing underwire-style bras or extremely tight bras.  Air dry your nipples for 3-4minutes after each feeding.  Use only cotton bra pads to absorb leaked breast milk. Leaking of breast milk between feedings  is normal.  Use lanolin on your nipples after breastfeeding. Lanolin helps to maintain your skin's normal moisture barrier. If you use pure lanolin, you do not need to wash it off before feeding your baby again. Pure lanolin is not toxic to your baby. You may also hand express a few drops of breast milk and gently massage that milk into your nipples and allow the milk to air dry. In the first few weeks after giving birth, some women experience extremely full breasts (engorgement). Engorgement can make your breasts feel heavy, warm, and tender to the touch. Engorgement peaks within 3-5 days after you give birth. The following recommendations can help ease engorgement:  Completely empty your breasts while breastfeeding or pumping. You may want to start by applying warm, moist heat (in the shower or with warm water-soaked hand towels) just before feeding or pumping. This increases circulation and helps the milk flow. If your baby does not completely empty your breasts while breastfeeding, pump any extra milk after he or she is finished.  Wear a snug bra (nursing or regular) or tank top for 1-2 days to signal your body to slightly decrease milk production.  Apply ice packs to your breasts, unless this is too uncomfortable for you.  Make sure that your baby is latched on and positioned properly while breastfeeding. If engorgement persists after 48 hours of following these recommendations, contact your health care provider or a lactation consultant. OVERALL HEALTH CARE RECOMMENDATIONS WHILE BREASTFEEDING  Eat healthy foods. Alternate between meals and snacks, eating 3 of each per day. Because what you eat affects your breast milk, some of the foods may make your baby more irritable than usual. Avoid eating these foods if you are sure that they are negatively affecting your baby.  Drink milk, fruit juice, and water to satisfy your thirst (about 10 glasses a day).  Rest often, relax, and continue to take  your prenatal vitamins to prevent fatigue, stress, and anemia.  Continue breast self-awareness checks.  Avoid chewing and smoking tobacco. Chemicals from cigarettes that pass into breast milk and exposure to secondhand smoke may harm your baby.  Avoid alcohol and drug use, including marijuana. Some medicines that may be harmful to your baby can pass through breast milk. It is important to ask your health care provider before taking any medicine, including all over-the-counter and prescription medicine as well as vitamin and herbal supplements. It is possible to become pregnant while breastfeeding. If birth control is desired, ask your health care provider about options that will be safe for your baby. SEEK MEDICAL CARE IF:  You feel like you want to stop breastfeeding or have become frustrated with breastfeeding.  You have painful breasts or nipples.  Your nipples are cracked or bleeding.  Your breasts are red, tender, or warm.  You have a swollen area on either breast.  You have a fever or chills.  You have nausea or vomiting.  You have drainage other than breast milk from your nipples.  Your breasts do not become full before feedings by the fifth day after you give birth.  You feel sad and depressed.  Your baby is too sleepy to eat well.  Your baby is having trouble sleeping.     Your baby is wetting less than 3 diapers in a 24-hour period.  Your baby has less than 3 stools in a 24-hour period.  Your baby's skin or the white part of his or her eyes becomes yellow.   Your baby is not gaining weight by 5 days of age. SEEK IMMEDIATE MEDICAL CARE IF:  Your baby is overly tired (lethargic) and does not want to wake up and feed.  Your baby develops an unexplained fever.   This information is not intended to replace advice given to you by your health care provider. Make sure you discuss any questions you have with your health care provider.   Document Released: 03/16/2005  Document Revised: 12/05/2014 Document Reviewed: 09/07/2012 Elsevier Interactive Patient Education 2016 [employer]  

## 2015-09-19 NOTE — Progress Notes (Signed)
Subjective:  Tina Holder is a 30 y.o. G4P3003 at [redacted]w[redacted]d being seen today for ongoing prenatal care.  She is currently monitored for the following issues for this low-risk pregnancy and has ALLERGIC RHINITIS CAUSE UNSPECIFIED; Low birth weight in full term infant, 1750-1999 grams; Hx of preeclampsia, prior pregnancy, currently pregnant; Migraine headache; Asthma; GERD (gastroesophageal reflux disease); Sterilization; Supervision of other normal pregnancy, antepartum; and Muscle spasm on her problem list.  Patient reports no complaints.  Contractions: Not present. Vag. Bleeding: None.  Movement: Present. Denies leaking of fluid.   The following portions of the patient's history were reviewed and updated as appropriate: allergies, current medications, past family history, past medical history, past social history, past surgical history and problem list. Problem list updated.  Objective:   Filed Vitals:   09/19/15 0932  BP: 106/72  Pulse: 67  Weight: 190 lb (86.183 kg)    Fetal Status: Fetal Heart Rate (bpm): 131 Fundal Height: 33 cm Movement: Present     General:  Alert, oriented and cooperative. Patient is in no acute distress.  Skin: Skin is warm and dry. No rash noted.   Cardiovascular: Normal heart rate noted  Respiratory: Normal respiratory effort, no problems with respiration noted  Abdomen: Soft, gravid, appropriate for gestational age. Pain/Pressure: Present     Pelvic: Cervical exam deferred        Extremities: Normal range of motion.  Edema: Trace  Mental Status: Normal mood and affect. Normal behavior. Normal judgment and thought content.   Urinalysis: Urine Protein: Trace Urine Glucose: Negative  Assessment and Plan:  Pregnancy: G4P3003 at [redacted]w[redacted]d  1. Supervision of other normal pregnancy, antepartum, third trimester Continue routine prenatal care. Cultures next visit.  Preterm labor symptoms and general obstetric precautions including but not limited to vaginal bleeding,  contractions, leaking of fluid and fetal movement were reviewed in detail with the patient. Please refer to After Visit Summary for other counseling recommendations.  Return in 2 weeks (on 10/03/2015).   [name], MD

## 2015-09-23 ENCOUNTER — Other Ambulatory Visit: Payer: Self-pay | Admitting: *Deleted

## 2015-09-23 DIAGNOSIS — M62838 Other muscle spasm: Secondary | ICD-10-CM

## 2015-09-23 MED ORDER — CYCLOBENZAPRINE HCL 10 MG PO TABS: 10.0000 mg | ORAL_TABLET | Freq: Three times a day (TID) | ORAL | Status: DC | PRN

## 2015-09-23 NOTE — Telephone Encounter (Signed)
Refilled Flexeril per verbal order from Allie Dimmer, [place]

## 2015-10-04 ENCOUNTER — Ambulatory Visit (INDEPENDENT_AMBULATORY_CARE_PROVIDER_SITE_OTHER): Payer: Medicaid Other | Admitting: Obstetrics & Gynecology

## 2015-10-04 ENCOUNTER — Other Ambulatory Visit (HOSPITAL_COMMUNITY)
Admission: RE | Admit: 2015-10-04 | Discharge: 2015-10-04 | Disposition: A | Discharge status: Home / Self Care | Payer: Medicaid Other | Source: Ambulatory Visit | Attending: Obstetrics & Gynecology | Admitting: Obstetrics & Gynecology

## 2015-10-04 VITALS — BP 100/69 | HR 76 | Wt 189.0 lb

## 2015-10-04 DIAGNOSIS — D126 Benign neoplasm of colon, unspecified: Secondary | ICD-10-CM | POA: Insufficient documentation

## 2015-10-04 DIAGNOSIS — Z113 Encounter for screening for infections with a predominantly sexual mode of transmission: Secondary | ICD-10-CM

## 2015-10-04 DIAGNOSIS — Z3483 Encounter for supervision of other normal pregnancy, third trimester: Secondary | ICD-10-CM

## 2015-10-04 DIAGNOSIS — Z36 Encounter for antenatal screening of mother: Secondary | ICD-10-CM | POA: Diagnosis not present

## 2015-10-04 NOTE — Progress Notes (Signed)
Subjective:  Tina Holder is a 30 y.o. G4P3003 at [redacted]w[redacted]d being seen today for ongoing prenatal care.  She is currently monitored for the following issues for this low-risk pregnancy and has ALLERGIC RHINITIS CAUSE UNSPECIFIED; Low birth weight in full term infant, 1750-1999 grams; Hx of preeclampsia, prior pregnancy, currently pregnant; Migraine headache; Asthma; GERD (gastroesophageal reflux disease); Sterilization; Supervision of other normal pregnancy, antepartum; and Muscle spasm on her problem list.  Patient reports no complaints.  Contractions: Not present. Vag. Bleeding: None.  Movement: Present. Denies leaking of fluid.   The following portions of the patient's history were reviewed and updated as appropriate: allergies, current medications, past family history, past medical history, past social history, past surgical history and problem list. Problem list updated.  Objective:   Filed Vitals:   10/04/15 1000  BP: 100/69  Pulse: 76  Weight: 189 lb (85.73 kg)    Fetal Status: Fetal Heart Rate (bpm): 138 Fundal Height: 36 cm Movement: Present  Presentation: Vertex  General:  Alert, oriented and cooperative. Patient is in no acute distress.  Skin: Skin is warm and dry. No rash noted.   Cardiovascular: Normal heart rate noted  Respiratory: Normal respiratory effort, no problems with respiration noted  Abdomen: Soft, gravid, appropriate for gestational age. Pain/Pressure: Present     Pelvic:  Cervical exam performed Dilation: Fingertip Effacement (%): Thick Station: Ballotable  Extremities: Normal range of motion.  Edema: Trace  Mental Status: Normal mood and affect. Normal behavior. Normal judgment and thought content.   Urinalysis: Urine Protein: 1+ Urine Glucose: Negative  Assessment and Plan:  Pregnancy: G4P3003 at [redacted]w[redacted]d  1. Supervision of other normal pregnancy, antepartum, third trimester Pelvic cultures done today. - GC/Chlamydia probe amp ([place])not at [facility] -  Culture, beta strep (group b only) Preterm labor symptoms and general obstetric precautions including but not limited to vaginal bleeding, contractions, leaking of fluid and fetal movement were reviewed in detail with the patient. Please refer to After Visit Summary for other counseling recommendations.  Return in about 1 week (around 10/11/2015) for OB Visit.   Osborne Oman, MD

## 2015-10-04 NOTE — Patient Instructions (Signed)
Return to clinic for any scheduled appointments or obstetric concerns, or go to [facility] for evaluation  

## 2015-10-06 LAB — CULTURE, BETA STREP (GROUP B ONLY)

## 2015-10-07 LAB — GC/CHLAMYDIA PROBE AMP (~~LOC~~) NOT AT ARMC
CHLAMYDIA, DNA PROBE: NEGATIVE
Neisseria Gonorrhea: NEGATIVE

## 2015-10-09 ENCOUNTER — Ambulatory Visit (INDEPENDENT_AMBULATORY_CARE_PROVIDER_SITE_OTHER): Payer: Medicaid Other | Admitting: Obstetrics & Gynecology

## 2015-10-09 VITALS — BP 119/86 | HR 74 | Wt 194.0 lb

## 2015-10-09 DIAGNOSIS — O09293 Supervision of pregnancy with other poor reproductive or obstetric history, third trimester: Secondary | ICD-10-CM

## 2015-10-09 DIAGNOSIS — Z3483 Encounter for supervision of other normal pregnancy, third trimester: Secondary | ICD-10-CM

## 2015-10-09 NOTE — Progress Notes (Signed)
Subjective:  Tina Holder is a 30 y.o. MW [phone] at [redacted]w[redacted]d being seen today for ongoing prenatal care.  She is currently monitored for the following issues for this low-risk pregnancy and has ALLERGIC RHINITIS CAUSE UNSPECIFIED; Low birth weight in full term infant, 1750-1999 grams; Hx of preeclampsia, prior pregnancy, currently pregnant; Migraine headache; Asthma; GERD (gastroesophageal reflux disease); Sterilization; Supervision of other normal pregnancy, antepartum; and Muscle spasm on her problem list.  Patient reports no complaints.  Contractions: Not present. Vag. Bleeding: None.  Movement: Present. Denies leaking of fluid.   The following portions of the patient's history were reviewed and updated as appropriate: allergies, current medications, past family history, past medical history, past social history, past surgical history and problem list. Problem list updated.  Objective:   Filed Vitals:   10/09/15 0945  BP: 119/86  Pulse: 74  Weight: 194 lb (87.998 kg)    Fetal Status: Fetal Heart Rate (bpm): 142   Movement: Present     General:  Alert, oriented and cooperative. Patient is in no acute distress.  Skin: Skin is warm and dry. No rash noted.   Cardiovascular: Normal heart rate noted  Respiratory: Normal respiratory effort, no problems with respiration noted  Abdomen: Soft, gravid, appropriate for gestational age. Pain/Pressure: Present     Pelvic:  Cervical exam deferred        Extremities: Normal range of motion.  Edema: Trace  Mental Status: Normal mood and affect. Normal behavior. Normal judgment and thought content.   Urinalysis: Urine Protein: Trace Urine Glucose: Negative  Assessment and Plan:  Pregnancy: G4P3003 at [redacted]w[redacted]d  1. Supervision of other normal pregnancy, antepartum, third trimester - Doing well  2. Hx of preeclampsia, prior pregnancy, currently pregnant, third trimester - on baby asa  Preterm labor symptoms and general obstetric precautions  including but not limited to vaginal bleeding, contractions, leaking of fluid and fetal movement were reviewed in detail with the patient. Please refer to After Visit Summary for other counseling recommendations.  No Follow-up on file.   Emily Filbert, MD

## 2015-10-15 ENCOUNTER — Encounter (HOSPITAL_COMMUNITY): Payer: Self-pay | Admitting: *Deleted

## 2015-10-15 ENCOUNTER — Telehealth: Payer: Self-pay | Admitting: *Deleted

## 2015-10-15 ENCOUNTER — Inpatient Hospital Stay (HOSPITAL_COMMUNITY)
Admission: AD | Admit: 2015-10-15 | Discharge: 2015-10-15 | Disposition: A | Discharge status: Home / Self Care | Payer: Medicaid Other | Source: Ambulatory Visit | Attending: Obstetrics & Gynecology | Admitting: Obstetrics & Gynecology

## 2015-10-15 DIAGNOSIS — Z3492 Encounter for supervision of normal pregnancy, unspecified, second trimester: Secondary | ICD-10-CM

## 2015-10-15 DIAGNOSIS — Z3A37 37 weeks gestation of pregnancy: Secondary | ICD-10-CM | POA: Insufficient documentation

## 2015-10-15 DIAGNOSIS — Z3483 Encounter for supervision of other normal pregnancy, third trimester: Secondary | ICD-10-CM

## 2015-10-15 NOTE — MAU Note (Signed)
Pt c/o contractions since 11am-every 5-8 mins. Denies LOF or vag bleeding. +FM.

## 2015-10-15 NOTE — Discharge Instructions (Signed)

## 2015-10-15 NOTE — Telephone Encounter (Signed)
Pt is currently 37 wks c/o contractions every 10 min.  This is her fourth baby and has a history of precipitous delivery.  Informed pt to drink two large glasses of water and lay down and rest to see if they would stop.  Continue to monitor and if they increase to every 4-9min increasing in intensity and duration to then head to MAU for evaluation. Also if she starts to experience any bleeding or leaking of fluid to go to MAU.  Pt acknowledged instructions.

## 2015-10-16 ENCOUNTER — Inpatient Hospital Stay (HOSPITAL_COMMUNITY)
Admission: AD | Admit: 2015-10-16 | Discharge: 2015-10-19 | DRG: 775 | Disposition: A | Discharge status: Home / Self Care | Payer: Medicaid Other | Source: Ambulatory Visit | Attending: Obstetrics & Gynecology | Admitting: Obstetrics & Gynecology

## 2015-10-16 ENCOUNTER — Encounter (HOSPITAL_COMMUNITY): Payer: Self-pay | Admitting: *Deleted

## 2015-10-16 ENCOUNTER — Ambulatory Visit (INDEPENDENT_AMBULATORY_CARE_PROVIDER_SITE_OTHER): Payer: Medicaid Other | Admitting: Obstetrics & Gynecology

## 2015-10-16 VITALS — BP 154/89 | HR 65 | Wt 190.0 lb

## 2015-10-16 DIAGNOSIS — Z3A37 37 weeks gestation of pregnancy: Secondary | ICD-10-CM | POA: Diagnosis not present

## 2015-10-16 DIAGNOSIS — O133 Gestational [pregnancy-induced] hypertension without significant proteinuria, third trimester: Secondary | ICD-10-CM | POA: Diagnosis present

## 2015-10-16 DIAGNOSIS — Z8261 Family history of arthritis: Secondary | ICD-10-CM

## 2015-10-16 DIAGNOSIS — O134 Gestational [pregnancy-induced] hypertension without significant proteinuria, complicating childbirth: Secondary | ICD-10-CM | POA: Diagnosis present

## 2015-10-16 DIAGNOSIS — K219 Gastro-esophageal reflux disease without esophagitis: Secondary | ICD-10-CM | POA: Diagnosis present

## 2015-10-16 DIAGNOSIS — Z3483 Encounter for supervision of other normal pregnancy, third trimester: Secondary | ICD-10-CM

## 2015-10-16 DIAGNOSIS — O09293 Supervision of pregnancy with other poor reproductive or obstetric history, third trimester: Secondary | ICD-10-CM

## 2015-10-16 DIAGNOSIS — O9952 Diseases of the respiratory system complicating childbirth: Secondary | ICD-10-CM | POA: Diagnosis present

## 2015-10-16 DIAGNOSIS — O9962 Diseases of the digestive system complicating childbirth: Secondary | ICD-10-CM | POA: Diagnosis present

## 2015-10-16 DIAGNOSIS — O365930 Maternal care for other known or suspected poor fetal growth, third trimester, not applicable or unspecified: Secondary | ICD-10-CM | POA: Diagnosis not present

## 2015-10-16 DIAGNOSIS — J45909 Unspecified asthma, uncomplicated: Secondary | ICD-10-CM | POA: Diagnosis present

## 2015-10-16 LAB — CBC
HCT: 31.7 % — ABNORMAL LOW (ref 36.0–46.0)
HEMATOCRIT: 32.6 % — AB (ref 36.0–46.0)
HEMOGLOBIN: 11.1 g/dL — AB (ref 12.0–15.0)
Hemoglobin: 11.3 g/dL — ABNORMAL LOW (ref 12.0–15.0)
MCH: 29.8 pg (ref 26.0–34.0)
MCH: 31 pg (ref 26.0–34.0)
MCHC: 34 g/dL (ref 30.0–36.0)
MCHC: 35.6 g/dL (ref 30.0–36.0)
MCV: 87.1 fL (ref 78.0–100.0)
MCV: 87.6 fL (ref 78.0–100.0)
PLATELETS: 194 10*3/uL (ref 150–400)
Platelets: 197 10*3/uL (ref 150–400)
RBC: 3.64 MIL/uL — ABNORMAL LOW (ref 3.87–5.11)
RBC: 3.72 MIL/uL — AB (ref 3.87–5.11)
RDW: 13 % (ref 11.5–15.5)
RDW: 13 % (ref 11.5–15.5)
WBC: 4.3 10*3/uL (ref 4.0–10.5)
WBC: 5.2 10*3/uL (ref 4.0–10.5)

## 2015-10-16 LAB — URINALYSIS, ROUTINE W REFLEX MICROSCOPIC
BILIRUBIN URINE: NEGATIVE
Glucose, UA: NEGATIVE mg/dL
Ketones, ur: NEGATIVE mg/dL
NITRITE: NEGATIVE
PROTEIN: NEGATIVE mg/dL
Specific Gravity, Urine: 1.02 (ref 1.005–1.030)
pH: 6 (ref 5.0–8.0)

## 2015-10-16 LAB — COMPREHENSIVE METABOLIC PANEL
ALK PHOS: 212 U/L — AB (ref 38–126)
ALT: 13 U/L — ABNORMAL LOW (ref 14–54)
ANION GAP: 10 (ref 5–15)
AST: 22 U/L (ref 15–41)
Albumin: 3 g/dL — ABNORMAL LOW (ref 3.5–5.0)
BILIRUBIN TOTAL: 0.2 mg/dL — AB (ref 0.3–1.2)
BUN: 13 mg/dL (ref 6–20)
CALCIUM: 8.4 mg/dL — AB (ref 8.9–10.3)
CO2: 20 mmol/L — ABNORMAL LOW (ref 22–32)
Chloride: 105 mmol/L (ref 101–111)
Creatinine, Ser: 0.79 mg/dL (ref 0.44–1.00)
GFR calc non Af Amer: >60 mL/min (ref >60)
Glucose, Bld: 109 mg/dL — ABNORMAL HIGH (ref 65–99)
Potassium: 3.3 mmol/L — ABNORMAL LOW (ref 3.5–5.1)
Sodium: 135 mmol/L (ref 135–145)
TOTAL PROTEIN: 6.2 g/dL — AB (ref 6.5–8.1)

## 2015-10-16 LAB — URINE MICROSCOPIC-ADD ON

## 2015-10-16 LAB — PROTEIN / CREATININE RATIO, URINE
CREATININE, URINE: 216 mg/dL
Protein Creatinine Ratio: 0.11 mg/mg{Cre} (ref 0.00–0.15)
Total Protein, Urine: 24 mg/dL

## 2015-10-16 LAB — TYPE AND SCREEN
ABO/RH(D): O POS
Antibody Screen: NEGATIVE

## 2015-10-16 LAB — OB RESULTS CONSOLE GBS: STREP GROUP B AG: NEGATIVE

## 2015-10-16 LAB — ABO/RH: ABO/RH(D): O POS

## 2015-10-16 MED ORDER — LACTATED RINGERS IV SOLN
INTRAVENOUS | Status: DC
  Administered 2015-10-16: 23:00:00 via INTRAVENOUS

## 2015-10-16 MED ORDER — BUTALBITAL-APAP-CAFFEINE 50-325-40 MG PO TABS
1.0000 | ORAL_TABLET | Freq: Once | ORAL | Status: AC
  Administered 2015-10-16: 1 via ORAL
  Filled 2015-10-16: qty 1

## 2015-10-16 MED ORDER — SOD CITRATE-CITRIC ACID 500-334 MG/5ML PO SOLN: 30.0000 mL | ORAL | Status: DC | PRN

## 2015-10-16 MED ORDER — MISOPROSTOL 25 MCG QUARTER TABLET
25.0000 ug | ORAL_TABLET | ORAL | Status: DC | PRN
  Administered 2015-10-16: 25 ug via VAGINAL
  Filled 2015-10-16: qty 1
  Filled 2015-10-16: qty 0.25

## 2015-10-16 MED ORDER — ALBUTEROL SULFATE (2.5 MG/3ML) 0.083% IN NEBU: 3.0000 mL | INHALATION_SOLUTION | Freq: Four times a day (QID) | RESPIRATORY_TRACT | Status: DC | PRN

## 2015-10-16 MED ORDER — FENTANYL 2.5 MCG/ML BUPIVACAINE 1/10 % EPIDURAL INFUSION (WH - ANES)
14.0000 mL/h | INTRAMUSCULAR | Status: DC | PRN
  Administered 2015-10-17: 12.5 mL/h via EPIDURAL
  Filled 2015-10-16: qty 125

## 2015-10-16 MED ORDER — OXYTOCIN 40 UNITS IN LACTATED RINGERS INFUSION - SIMPLE MED
2.5000 [IU]/h | INTRAVENOUS | Status: DC
  Administered 2015-10-17: 2.5 [IU]/h via INTRAVENOUS
  Filled 2015-10-16: qty 1000

## 2015-10-16 MED ORDER — OXYCODONE-ACETAMINOPHEN 5-325 MG PO TABS: 2.0000 | ORAL_TABLET | ORAL | Status: DC | PRN

## 2015-10-16 MED ORDER — LABETALOL HCL 5 MG/ML IV SOLN: 20.0000 mg | INTRAVENOUS | Status: DC | PRN

## 2015-10-16 MED ORDER — ONDANSETRON HCL 4 MG/2ML IJ SOLN: 4.0000 mg | Freq: Four times a day (QID) | INTRAMUSCULAR | Status: DC | PRN

## 2015-10-16 MED ORDER — DIPHENHYDRAMINE HCL 50 MG/ML IJ SOLN: 12.5000 mg | INTRAMUSCULAR | Status: DC | PRN

## 2015-10-16 MED ORDER — LIDOCAINE HCL (PF) 1 % IJ SOLN
30.0000 mL | INTRAMUSCULAR | Status: DC | PRN
  Filled 2015-10-16: qty 30

## 2015-10-16 MED ORDER — LACTATED RINGERS IV SOLN: 500.0000 mL | INTRAVENOUS | Status: DC | PRN

## 2015-10-16 MED ORDER — BUTALBITAL-APAP-CAFFEINE 50-325-40 MG PO TABS
1.0000 | ORAL_TABLET | ORAL | Status: DC | PRN
  Administered 2015-10-16: 1 via ORAL
  Filled 2015-10-16: qty 1

## 2015-10-16 MED ORDER — TERBUTALINE SULFATE 1 MG/ML IJ SOLN
0.2500 mg | Freq: Once | INTRAMUSCULAR | Status: DC | PRN
Start: 2015-10-16 — End: 2015-10-17

## 2015-10-16 MED ORDER — OXYCODONE-ACETAMINOPHEN 5-325 MG PO TABS: 1.0000 | ORAL_TABLET | ORAL | Status: DC | PRN

## 2015-10-16 MED ORDER — EPHEDRINE 5 MG/ML INJ
10.0000 mg | INTRAVENOUS | Status: DC | PRN
  Filled 2015-10-16: qty 2

## 2015-10-16 MED ORDER — PHENYLEPHRINE 40 MCG/ML (10ML) SYRINGE FOR IV PUSH (FOR BLOOD PRESSURE SUPPORT)
80.0000 ug | PREFILLED_SYRINGE | INTRAVENOUS | Status: DC | PRN
  Filled 2015-10-16: qty 10
  Filled 2015-10-16: qty 5

## 2015-10-16 MED ORDER — ACETAMINOPHEN 325 MG PO TABS: 650.0000 mg | ORAL_TABLET | ORAL | Status: DC | PRN

## 2015-10-16 MED ORDER — PHENYLEPHRINE 40 MCG/ML (10ML) SYRINGE FOR IV PUSH (FOR BLOOD PRESSURE SUPPORT)
80.0000 ug | PREFILLED_SYRINGE | INTRAVENOUS | Status: DC | PRN
  Filled 2015-10-16: qty 5

## 2015-10-16 MED ORDER — FLEET ENEMA 7-19 GM/118ML RE ENEM: 1.0000 | ENEMA | RECTAL | Status: DC | PRN

## 2015-10-16 MED ORDER — HYDRALAZINE HCL 20 MG/ML IJ SOLN: 10.0000 mg | Freq: Once | INTRAMUSCULAR | Status: DC | PRN

## 2015-10-16 MED ORDER — LACTATED RINGERS IV SOLN: 500.0000 mL | Freq: Once | INTRAVENOUS | Status: DC

## 2015-10-16 MED ORDER — OXYTOCIN BOLUS FROM INFUSION
500.0000 mL | INTRAVENOUS | Status: DC
  Administered 2015-10-17: 500 mL via INTRAVENOUS

## 2015-10-16 NOTE — H&P (Signed)
LABOR AND DELIVERY ADMISSION HISTORY AND PHYSICAL NOTE  Tina Holder is a 30 y.o. female [phone] with IUP at [redacted]w[redacted]d by [place] at 8 weeks presenting for IOL for gestational hypertension WITHOUT pre-eclampsia.    She reports positive fetal movement. She denies leakage of fluid or vaginal bleeding or contractions. Admits to headache, no changes in vision, no epigastric/abdominal pain, minimal swelling in ankles only, denies CP/SOB.  I reviewed patient's past medical history, surgical history, social history, medications, allergies, family history and agree with documentation below.  Prenatal History/Complications:  Gestational hypertension, prior history of pre-E at 36 weeks with IUGR  Past Medical History: Past Medical History  Diagnosis Date  . Headache(784.0)   . Pregnancy induced hypertension   . Obesity     Past Surgical History: Past Surgical History  Procedure Laterality Date  . Tonsillectomy  2005  . Wisdom tooth extraction    . Tubal ligation  01/01/2012    Procedure: POST PARTUM TUBAL LIGATION;  Surgeon: Lavonia Drafts, MD;  Location: [facility];  Service: Gynecology;  Laterality: Bilateral;  with filshie clips    Obstetrical History: OB History    Gravida Para Term Preterm AB TAB SAB Ectopic Multiple Living   4 3 3  0 0 0 0 0 0 3      Social History: Social History   Social History  . Marital Status: Married    Spouse Name: N/A  . Number of Children: N/A  . Years of Education: N/A   Social History Main Topics  . Smoking status: Never Smoker   . Smokeless tobacco: Never Used  . Alcohol Use: No  . Drug Use: No  . Sexual Activity: Yes    Birth Control/ Protection: None     Comment: tubal   Other Topics Concern  . None   Social History Narrative    Family History: Family History  Problem Relation Age of Onset  . Arthritis Mother   . Thyroid disease Maternal Grandmother   . Lupus Maternal Grandmother     Allergies: No Known  Allergies  Prescriptions prior to admission  Medication Sig Dispense Refill Last Dose  . butalbital-acetaminophen-caffeine (FIORICET, ESGIC) 50-325-40 MG tablet TAKE 1 TO 2 TABLETS EVERY 6 HOURS AS NEEDED FOR HEADACHE 30 tablet 1 Past Week at Unknown time  . calcium carbonate (TUMS - DOSED IN MG ELEMENTAL CALCIUM) 500 MG chewable tablet Chew 1 tablet by mouth daily as needed for indigestion or heartburn.   Past Week at Unknown time  . cyclobenzaprine (FLEXERIL) 10 MG tablet Take 1 tablet (10 mg total) by mouth every 8 (eight) hours as needed for muscle spasms. 30 tablet 1 Past Week at Unknown time  . metoCLOPramide (REGLAN) 10 MG tablet Take 1 tablet (10 mg total) by mouth 3 (three) times daily as needed for nausea. 30 tablet 1 Past Week at Unknown time  . Pediatric Multivit-Minerals-C (CHILDRENS GUMMIES PO) Take by mouth.   10/15/2015 at Unknown time  . albuterol (PROVENTIL HFA;VENTOLIN HFA) 108 (90 Base) MCG/ACT inhaler Inhale 2 puffs into the lungs every 6 (six) hours as needed for wheezing. 1 Inhaler 2 rescue  . promethazine (PHENERGAN) 25 MG tablet TAKE ONE TABLET BY MOUTH EVERY 6 HOURS AS NEEDED FOR NAUSEA OR VOMITING 30 tablet 1 2 weeks     Review of Systems   All systems reviewed and negative except as stated in HPI  Blood pressure 119/73, pulse 76, temperature 98.6 F (37 C), last menstrual period 12/27/2014, currently breastfeeding. General appearance: alert,  appears stated age and no distress Lungs: clear to auscultation bilaterally Heart: regular rate and rhythm Abdomen: soft, non-tender; bowel sounds normal Extremities: No calf swelling or tenderness Presentation: cephalic Fetal monitoring: 120, mod var, +accels, -decels Uterine activity: none  Dilation: 1 Effacement (%): Thick Station: Ballotable Exam by:: Blair Hailey RN    Prenatal labs: ABO, Rh: O/POS/-- (12/28 1028) Antibody: NEG (12/28 1028) Rubella: !Error!  IMMUNE RPR: NON REAC (05/15 1457)  HBsAg: NEGATIVE  (12/28 1028)  HIV: NONREACTIVE (05/15 1457)  GBS:   NEG 1 hr Glucola: 116 Genetic screening:  Declined Anatomy US: Normal, girl  Prenatal Transfer Tool  Maternal Diabetes: No Genetic Screening: Declined Maternal Ultrasounds/Referrals: Normal Fetal Ultrasounds or other Referrals:  None Maternal Substance Abuse:  No Significant Maternal Medications:  None Significant Maternal Lab Results: None  Results for orders placed or performed during the hospital encounter of 10/16/15 (from the past 24 hour(s))  Urinalysis, Routine w reflex microscopic (not at [facility])   Collection Time: 10/16/15 11:50 AM  Result Value Ref Range   Color, Urine YELLOW YELLOW   APPearance CLEAR CLEAR   Specific Gravity, Urine 1.020 1.005 - 1.030   pH 6.0 5.0 - 8.0   Glucose, UA NEGATIVE NEGATIVE mg/dL   Hgb urine dipstick TRACE (A) NEGATIVE   Bilirubin Urine NEGATIVE NEGATIVE   Ketones, ur NEGATIVE NEGATIVE mg/dL   Protein, ur NEGATIVE NEGATIVE mg/dL   Nitrite NEGATIVE NEGATIVE   Leukocytes, UA SMALL (A) NEGATIVE  Protein / creatinine ratio, urine   Collection Time: 10/16/15 11:50 AM  Result Value Ref Range   Creatinine, Urine 216.00 mg/dL   Total Protein, Urine 24 mg/dL   Protein Creatinine Ratio 0.11 0.00 - 0.15 mg/mg[Cre]  Urine microscopic-add on   Collection Time: 10/16/15 11:50 AM  Result Value Ref Range   Squamous Epithelial / LPF 0-5 (A) NONE SEEN   WBC, UA 0-5 0 - 5 WBC/hpf   RBC / HPF 0-5 0 - 5 RBC/hpf   Bacteria, UA RARE (A) NONE SEEN  Comprehensive metabolic panel   Collection Time: 10/16/15 12:27 PM  Result Value Ref Range   Sodium 135 135 - 145 mmol/L   Potassium 3.3 (L) 3.5 - 5.1 mmol/L   Chloride 105 101 - 111 mmol/L   CO2 20 (L) 22 - 32 mmol/L   Glucose, Bld 109 (H) 65 - 99 mg/dL   BUN 13 6 - 20 mg/dL   Creatinine, Ser 0.79 0.44 - 1.00 mg/dL   Calcium 8.4 (L) 8.9 - 10.3 mg/dL   Total Protein 6.2 (L) 6.5 - 8.1 g/dL   Albumin 3.0 (L) 3.5 - 5.0 g/dL   AST 22 15 - 41 U/L   ALT 13  (L) 14 - 54 U/L   Alkaline Phosphatase 212 (H) 38 - 126 U/L   Total Bilirubin 0.2 (L) 0.3 - 1.2 mg/dL   GFR calc non Af Amer >60 >60 mL/min   GFR calc Af Amer >60 >60 mL/min   Anion gap 10 5 - 15  CBC   Collection Time: 10/16/15 12:28 PM  Result Value Ref Range   WBC 4.3 4.0 - 10.5 K/uL   RBC 3.64 (L) 3.87 - 5.11 MIL/uL   Hemoglobin 11.3 (L) 12.0 - 15.0 g/dL   HCT 31.7 (L) 36.0 - 46.0 %   MCV 87.1 78.0 - 100.0 fL   MCH 31.0 26.0 - 34.0 pg   MCHC 35.6 30.0 - 36.0 g/dL   RDW 13.0 11.5 - 15.5 %  Platelets 194 150 - 400 K/uL    Patient Active Problem List   Diagnosis Date Noted  . Muscle spasm 09/06/2015  . Supervision of other normal pregnancy, antepartum 03/27/2015  . Sterilization 01/01/2012  . GERD (gastroesophageal reflux disease) 11/25/2011  . Asthma 09/22/2011  . Low birth weight in full term infant, 1750-1999 grams 06/10/2011  . Hx of preeclampsia, prior pregnancy, currently pregnant 06/10/2011  . Migraine headache 06/10/2011  . ALLERGIC RHINITIS CAUSE UNSPECIFIED 06/01/2010    Assessment: Tina Holder is a 30 y.o. G4P3003 at [redacted]w[redacted]d here for IOL for gestational hypertension, diagnosed today, WITHOUT pre-eclampsia.   Routine intrapartum care and IOL care.  Induction to be started with cytotec placement PV.  Monitor BPs Fioricet for headaches. GBS Negative    Katherine Basset, DO 10/16/2015, 3:55 PM

## 2015-10-16 NOTE — Anesthesia Pain Management Evaluation Note (Signed)
  CRNA Pain Management Visit Note  Patient: Tina Holder, 30 y.o., female  "Hello I am a member of the anesthesia team at [facility]. We have an anesthesia team available at all times to provide care throughout the hospital, including epidural management and anesthesia for C-section. I don't know your plan for the delivery whether it a natural birth, water birth, IV sedation, nitrous supplementation, doula or epidural, but we want to meet your pain goals."   1.Was your pain managed to your expectations on prior hospitalizations?   Yes   2.What is your expectation for pain management during this hospitalization?     Epidural  3.How can we help you reach that goal? Epidural when ready.  Record the patient's initial score and the patient's pain goal.   Pain: 0  Pain Goal: 6 [facility] wants you to be able to say your pain was always managed very well.  Angelea Penny L 10/16/2015

## 2015-10-16 NOTE — MAU Note (Signed)
Urine sent to lab 

## 2015-10-16 NOTE — Progress Notes (Signed)
Subjective:  Tina Holder is a 30 y.o. G4P3003 at [redacted]w[redacted]d being seen today for ongoing prenatal care.  She is currently monitored for the following issues for this high-risk pregnancy and has ALLERGIC RHINITIS CAUSE UNSPECIFIED; Low birth weight in full term infant, 1750-1999 grams; Hx of preeclampsia, prior pregnancy, currently pregnant; Migraine headache; Asthma; GERD (gastroesophageal reflux disease); Sterilization; Supervision of other normal pregnancy, antepartum; and Muscle spasm on her problem list.  Patient reports no complaints.  Contractions: Irregular. Vag. Bleeding: None.  Movement: Present. Denies leaking of fluid.   The following portions of the patient's history were reviewed and updated as appropriate: allergies, current medications, past family history, past medical history, past social history, past surgical history and problem list. Problem list updated.  Objective:   Filed Vitals:   10/16/15 0956  BP: 154/89  Pulse: 65  Weight: 190 lb (86.183 kg)    Fetal Status: Fetal Heart Rate (bpm): 140   Movement: Present     General:  Alert, oriented and cooperative. Patient is in no acute distress.  Skin: Skin is warm and dry. No rash noted.   Cardiovascular: Normal heart rate noted  Respiratory: Normal respiratory effort, no problems with respiration noted  Abdomen: Soft, gravid, appropriate for gestational age. Pain/Pressure: Present     Pelvic:  Cervical exam performed        Extremities: Normal range of motion.  Edema: Trace  Mental Status: Normal mood and affect. Normal behavior. Normal judgment and thought content.   Urinalysis: Urine Protein: 1+ Urine Glucose: Negative  Assessment and Plan:  Pregnancy: [id number] at [redacted]w[redacted]d  There are no diagnoses linked to this encounter. Term labor symptoms and general obstetric precautions including but not limited to vaginal bleeding, contractions, leaking of fluid and fetal movement were reviewed in detail with the patient. Please  refer to After Visit Summary for other counseling recommendations.  No Follow-up on file.   Emily Filbert, MD

## 2015-10-17 ENCOUNTER — Inpatient Hospital Stay (HOSPITAL_COMMUNITY): Payer: Medicaid Other | Admitting: Anesthesiology

## 2015-10-17 ENCOUNTER — Encounter (HOSPITAL_COMMUNITY): Payer: Self-pay

## 2015-10-17 DIAGNOSIS — O365930 Maternal care for other known or suspected poor fetal growth, third trimester, not applicable or unspecified: Secondary | ICD-10-CM

## 2015-10-17 DIAGNOSIS — O134 Gestational [pregnancy-induced] hypertension without significant proteinuria, complicating childbirth: Secondary | ICD-10-CM

## 2015-10-17 DIAGNOSIS — Z3A37 37 weeks gestation of pregnancy: Secondary | ICD-10-CM

## 2015-10-17 LAB — RPR: RPR Ser Ql: NONREACTIVE

## 2015-10-17 MED ORDER — SIMETHICONE 80 MG PO CHEW: 80.0000 mg | CHEWABLE_TABLET | ORAL | Status: DC | PRN

## 2015-10-17 MED ORDER — ZOLPIDEM TARTRATE 5 MG PO TABS
5.0000 mg | ORAL_TABLET | Freq: Every evening | ORAL | Status: DC | PRN
Start: 2015-10-17 — End: 2015-10-19

## 2015-10-17 MED ORDER — TETANUS-DIPHTH-ACELL PERTUSSIS 5-2.5-18.5 LF-MCG/0.5 IM SUSP: 0.5000 mL | Freq: Once | INTRAMUSCULAR | Status: DC

## 2015-10-17 MED ORDER — LIDOCAINE HCL (PF) 1 % IJ SOLN
INTRAMUSCULAR | Status: DC | PRN
  Administered 2015-10-17 (×2): 4 mL via EPIDURAL

## 2015-10-17 MED ORDER — IBUPROFEN 600 MG PO TABS
600.0000 mg | ORAL_TABLET | Freq: Four times a day (QID) | ORAL | Status: DC
  Administered 2015-10-17 – 2015-10-19 (×9): 600 mg via ORAL
  Filled 2015-10-17 (×9): qty 1

## 2015-10-17 MED ORDER — ACETAMINOPHEN 325 MG PO TABS
650.0000 mg | ORAL_TABLET | ORAL | Status: DC | PRN
  Administered 2015-10-17: 650 mg via ORAL
  Filled 2015-10-17 (×2): qty 2

## 2015-10-17 MED ORDER — WITCH HAZEL-GLYCERIN EX PADS: 1.0000 "application " | MEDICATED_PAD | CUTANEOUS | Status: DC | PRN

## 2015-10-17 MED ORDER — DIBUCAINE 1 % RE OINT
1.0000 | TOPICAL_OINTMENT | RECTAL | Status: DC | PRN
Start: 2015-10-17 — End: 2015-10-19
  Administered 2015-10-18: 1 via RECTAL
  Filled 2015-10-17: qty 28

## 2015-10-17 MED ORDER — ONDANSETRON HCL 4 MG PO TABS: 4.0000 mg | ORAL_TABLET | ORAL | Status: DC | PRN

## 2015-10-17 MED ORDER — SENNOSIDES-DOCUSATE SODIUM 8.6-50 MG PO TABS
2.0000 | ORAL_TABLET | ORAL | Status: DC
  Administered 2015-10-17 – 2015-10-19 (×2): 2 via ORAL
  Filled 2015-10-17 (×2): qty 2

## 2015-10-17 MED ORDER — ONDANSETRON HCL 4 MG/2ML IJ SOLN: 4.0000 mg | INTRAMUSCULAR | Status: DC | PRN

## 2015-10-17 MED ORDER — BENZOCAINE-MENTHOL 20-0.5 % EX AERO: 1.0000 "application " | INHALATION_SPRAY | CUTANEOUS | Status: DC | PRN

## 2015-10-17 MED ORDER — PRENATAL MULTIVITAMIN CH
1.0000 | ORAL_TABLET | Freq: Every day | ORAL | Status: DC
  Administered 2015-10-17 – 2015-10-18 (×2): 1 via ORAL
  Filled 2015-10-17 (×2): qty 1

## 2015-10-17 MED ORDER — COCONUT OIL OIL: 1.0000 "application " | TOPICAL_OIL | Status: DC | PRN

## 2015-10-17 MED ORDER — DIPHENHYDRAMINE HCL 25 MG PO CAPS
25.0000 mg | ORAL_CAPSULE | Freq: Four times a day (QID) | ORAL | Status: DC | PRN
Start: 2015-10-17 — End: 2015-10-19

## 2015-10-17 NOTE — Anesthesia Preprocedure Evaluation (Signed)
Anesthesia Evaluation  Patient identified by MRN, date of birth, ID band Patient awake    Reviewed: Allergy & Precautions, NPO status , Patient's Chart, lab work & pertinent test results  Airway Mallampati: III  TM Distance: >3 FB Neck ROM: Full    Dental no notable dental hx. (+) Teeth Intact   Pulmonary asthma ,    Pulmonary exam normal breath sounds clear to auscultation       Cardiovascular hypertension, Normal cardiovascular exam Rhythm:Regular Rate:Normal  Gestational HTN   Neuro/Psych  Headaches, negative psych ROS   GI/Hepatic Neg liver ROS, GERD  Medicated and Controlled,  Endo/Other  Obesity  Renal/GU negative Renal ROS  negative genitourinary   Musculoskeletal negative musculoskeletal ROS (+)   Abdominal (+) + obese,   Peds  Hematology  (+) anemia ,   Anesthesia Other Findings   Reproductive/Obstetrics (+) Pregnancy                             Anesthesia Physical Anesthesia Plan  ASA: III  Anesthesia Plan: Epidural   Post-op Pain Management:    Induction:   Airway Management Planned: Natural Airway  Additional Equipment:   Intra-op Plan:   Post-operative Plan:   Informed Consent: I have reviewed the patients History and Physical, chart, labs and discussed the procedure including the risks, benefits and alternatives for the proposed anesthesia with the patient or authorized representative who has indicated his/her understanding and acceptance.     Plan Discussed with: Anesthesiologist  Anesthesia Plan Comments:         Anesthesia Quick Evaluation

## 2015-10-17 NOTE — Progress Notes (Signed)
Patient ID: Tina Holder, female   DOB: Apr 05, 1985, 30 y.o.   MRN: [id number]  Now comfortable w/ epidural  BP 128/69, other VSS EFM 130s, +accels, no decels Ctx q 2 mins, spont Cx 4/70/-2; AROM for scant clear/pink   IUP@term  Latent phase gHTN- stable  Will reeval cx in 2 hrs or sooner prn pressure  Maecy Podgurski CNM 10/17/2015 1:38 AM

## 2015-10-17 NOTE — Anesthesia Procedure Notes (Signed)
Epidural Patient location during procedure: OB Start time: 10/17/2015 12:27 AM  Staffing Anesthesiologist: Josephine Igo Performed by: anesthesiologist   Preanesthetic Checklist Completed: patient identified, site marked, surgical consent, pre-op evaluation, timeout performed, IV checked, risks and benefits discussed and monitors and equipment checked  Epidural Patient position: sitting Prep: site prepped and draped and DuraPrep Patient monitoring: continuous pulse ox and blood pressure Approach: midline Location: L3-L4 Injection technique: LOR air  Needle:  Needle type: Tuohy  Needle gauge: 17 G Needle length: 9 cm and 9 Needle insertion depth: 5 cm cm Catheter type: closed end flexible Catheter size: 19 Gauge Catheter at skin depth: 10 cm Test dose: negative and Other  Assessment Events: blood not aspirated, injection not painful, no injection resistance, negative IV test and no paresthesia  Additional Notes Patient identified. Risks and benefits discussed including failed block, incomplete  Pain control, post dural puncture headache, nerve damage, paralysis, blood pressure Changes, nausea, vomiting, reactions to medications-both toxic and allergic and post Partum back pain. All questions were answered. Patient expressed understanding and wished to proceed. Sterile technique was used throughout procedure. Epidural site was Dressed with sterile barrier dressing. No paresthesias, signs of intravascular injection Or signs of intrathecal spread were encountered.  Patient was more comfortable after the epidural was dosed. Please see RN's note for documentation of vital signs and FHR which are stable.

## 2015-10-17 NOTE — Lactation Note (Signed)
This note was copied from a baby's chart. Lactation Consultation Note  Patient Name: Girl Saniyya Neri [id number] Date: 10/17/2015 Reason for consult: Initial assessment;Infant < 6lbs   Initial visit made.  Breast feeding consultation services and support information given and reviewed.  Mom states this is her fourth baby and she is very confident with breastfeeding.  Her first two babies she pumped and bottle fed x 1 year.  She reports an abundant supply which allowed her to also donate milk.  Her third baby would not latch so her twin sisiter nursed the baby for 2 years.  Mom repeated she knows her body well and has her plan.  She states she will also give some formula in the hospital "to keep weight up".  I reviewed newborn behavior with mom. I discussed initiating post pumping to establish a good supply due to low birth weight baby.   Mom states she waits until she goes home and then starts pumping.  Mom reports the baby is latching easily every 2 hours.  She also mentioned her best friend is a Science writer and will be supporting her.  Encouraged her to call for latch assist/concerns prn.  Maternal Data    Feeding Feeding Type: Breast Fed Length of feed: 10 min  LATCH Score/Interventions                      Lactation Tools Discussed/Used     Consult Status Consult Status: Follow-up Date: 10/18/15 Follow-up type: In-patient    Ave Filter 10/17/2015, 2:58 PM

## 2015-10-17 NOTE — Anesthesia Postprocedure Evaluation (Signed)
Anesthesia Post Note  Patient: Tina Holder  Procedure(s) Performed: * No procedures listed *  Patient location during evaluation: [facility] Anesthesia Type: Epidural Level of consciousness: awake and alert Pain management: pain level controlled Vital Signs Assessment: post-procedure vital signs reviewed and stable Respiratory status: spontaneous breathing, nonlabored ventilation and respiratory function stable Cardiovascular status: stable Postop Assessment: no headache, no backache and epidural receding Anesthetic complications: no     Last Vitals:  Filed Vitals:   10/17/15 0437 10/17/15 0533  BP: 118/52 118/65  Pulse: 56 56  Temp: 36.4 C 36.8 C  Resp: 20 20    Last Pain:  Filed Vitals:   10/17/15 0730  PainSc: 0-No pain   Pain Goal: Patients Stated Pain Goal: 5 (10/17/15 0527)               Riki Sheer

## 2015-10-17 NOTE — Progress Notes (Signed)
Patient ID: Tina Holder, female   DOB: [birth year], 30 y.o.   MRN: [id number]   Pt feeling some mild cramping; essentially comfortable; had cytotec x 1 dose @ 1840  BP 124/64, other VSS FHR 130s, +accels, no decels Ctx q 2-5 mins per toco Cx deferred  IUP@37 .6wks gHTN Cx unfavorable  Will reeval cx @ 2240 to determine plan of care Pt prefers AROM/Pit rather than repeat cytotec  Serita Grammes CNM 10/17/2015 1:36 AM

## 2015-10-18 MED ORDER — BUTALBITAL-APAP-CAFFEINE 50-325-40 MG PO TABS
2.0000 | ORAL_TABLET | ORAL | Status: DC | PRN
  Administered 2015-10-18 – 2015-10-19 (×2): 2 via ORAL
  Filled 2015-10-18 (×2): qty 2

## 2015-10-18 NOTE — Progress Notes (Signed)
Post Partum Day 1 Subjective: +temporal HA on left, just took Motrin  Objective: Blood pressure 132/70, pulse 64, temperature 98 F (36.7 C), temperature source Oral, resp. rate 17, height 5\' 2"  (1.575 m), weight 189 lb (85.73 kg), last menstrual period 12/27/2014, SpO2 98 %, unknown if currently breastfeeding.  Physical Exam:  General: alert, cooperative and no distress Lochia: appropriate Uterine Fundus: firm DVT Evaluation: No evidence of DVT seen on physical exam. Negative Homan's sign. No cords or calf tenderness. No significant calf/ankle edema.   Recent Labs  10/16/15 1228 10/16/15 2340  HGB 11.3* 11.1*  HCT 31.7* 32.6*    Assessment/Plan: Plan for discharge tomorrow Gestational HTN, delivered-stable   LOS: 2 days   Julianne Handler 10/18/2015, 11:50 AM

## 2015-10-18 NOTE — Lactation Note (Signed)
This note was copied from a baby's chart. Lactation Consultation Note  Patient Name: Tina Holder Date: 10/18/2015 Reason for consult: Follow-up assessment;Infant < 6lbs   Follow up with mom of 35 hour old infant. Infant with 12 BF for 10-30 minutes, 8 bottle fees of 10-17 cc, 8 voids and 9 stools in last 24 hours. Infant weight 4lb 15 oz with 7% weight loss since birth. LATCH scores 8-9 by bedside RN's. Mom reports infant is latching well and she has no questions/concerns at this time. Mom reports she has some milk nipple soreness, enc her to express colostrum and rub on nipple post BF. Enc mom to call with questions/concerns prn.    Maternal Data    Feeding Feeding Type: Breast Fed Length of feed: 15 min  LATCH Score/Interventions                      Lactation Tools Discussed/Used     Consult Status Consult Status: PRN Follow-up type: Call as needed    Donn Pierini 10/18/2015, 12:08 PM

## 2015-10-19 ENCOUNTER — Telehealth: Payer: Self-pay | Admitting: Certified Nurse Midwife

## 2015-10-19 MED ORDER — IBUPROFEN 600 MG PO TABS: 600.0000 mg | ORAL_TABLET | Freq: Four times a day (QID) | ORAL | Status: DC | PRN

## 2015-10-19 NOTE — Telephone Encounter (Signed)
2 days post SVD c/o swollen submandibular lymph node, noticed few min ago. Temp currently 99.2. No chills or fevers. No respiratory sx. Lochia and pain minimal. Feels exhausted. Breastfeeding w/o problem.  ?viral etiology Rest Hydrate Call or return to hospital for temp >100.4, or worsening sx

## 2015-10-19 NOTE — Discharge Instructions (Signed)
NO SEX UNTIL AFTER YOU GET YOUR BIRTH CONTROL  Postpartum Care After Vaginal Delivery After you deliver your newborn (postpartum period), the usual stay in the hospital is 24-72 hours. If there were problems with your labor or delivery, or if you have other medical problems, you might be in the hospital longer.  While you are in the hospital, you will receive help and instructions on how to care for yourself and your newborn during the postpartum period.  While you are in the hospital:  Be sure to tell your nurses if you have pain or discomfort, as well as where you feel the pain and what makes the pain worse.  If you had an incision made near your vagina (episiotomy) or if you had some tearing during delivery, the nurses may put ice packs on your episiotomy or tear. The ice packs may help to reduce the pain and swelling.  If you are breastfeeding, you may feel uncomfortable contractions of your uterus for a couple of weeks. This is normal. The contractions help your uterus get back to normal size.  It is normal to have some bleeding after delivery.  For the first 1-3 days after delivery, the flow is red and the amount may be similar to a period.  It is common for the flow to start and stop.  In the first few days, you may pass some small clots. Let your nurses know if you begin to pass large clots or your flow increases.  Do not  flush blood clots down the toilet before having the nurse look at them.  During the next 3-10 days after delivery, your flow should become more watery and pink or brown-tinged in color.  Ten to fourteen days after delivery, your flow should be a small amount of yellowish-white discharge.  The amount of your flow will decrease over the first few weeks after delivery. Your flow may stop in 6-8 weeks. Most women have had their flow stop by 12 weeks after delivery.  You should change your sanitary pads frequently.  Wash your hands thoroughly with soap and water  for at least 20 seconds after changing pads, using the toilet, or before holding or feeding your newborn.  You should feel like you need to empty your bladder within the first 6-8 hours after delivery.  In case you become weak, lightheaded, or faint, call your nurse before you get out of bed for the first time and before you take a shower for the first time.  Within the first few days after delivery, your breasts may begin to feel tender and full. This is called engorgement. Breast tenderness usually goes away within 48-72 hours after engorgement occurs. You may also notice milk leaking from your breasts. If you are not breastfeeding, do not stimulate your breasts. Breast stimulation can make your breasts produce more milk.  Spending as much time as possible with your newborn is very important. During this time, you and your newborn can feel close and get to know each other. Having your newborn stay in your room (rooming in) will help to strengthen the bond with your newborn. It will give you time to get to know your newborn and become comfortable caring for your newborn.  Your hormones change after delivery. Sometimes the hormone changes can temporarily cause you to feel sad or tearful. These feelings should not last more than a few days. If these feelings last longer than that, you should talk to your caregiver.  If desired, talk  to your caregiver about methods of family planning or contraception.  Talk to your caregiver about immunizations. Your caregiver may want you to have the following immunizations before leaving the hospital:  Tetanus, diphtheria, and pertussis (Tdap) or tetanus and diphtheria (Td) immunization. It is very important that you and your family (including grandparents) or others caring for your newborn are up-to-date with the Tdap or Td immunizations. The Tdap or Td immunization can help protect your newborn from getting ill.  Rubella immunization.  Varicella (chickenpox)  immunization.  Influenza immunization. You should receive this annual immunization if you did not receive the immunization during your pregnancy.   This information is not intended to replace advice given to you by your health care provider. Make sure you discuss any questions you have with your health care provider.   Document Released: 01/11/2007 Document Revised: 12/09/2011 Document Reviewed: 11/11/2011 Elsevier Interactive Patient Education [employer].  Breastfeeding Deciding to breastfeed is one of the best choices you can make for you and your baby. A change in hormones during pregnancy causes your breast tissue to grow and increases the number and size of your milk ducts. These hormones also allow proteins, sugars, and fats from your blood supply to make breast milk in your milk-producing glands. Hormones prevent breast milk from being released before your baby is born as well as prompt milk flow after birth. Once breastfeeding has begun, thoughts of your baby, as well as his or her sucking or crying, can stimulate the release of milk from your milk-producing glands.  BENEFITS OF BREASTFEEDING For Your Baby  Your first milk (colostrum) helps your baby's digestive system function better.  There are antibodies in your milk that help your baby fight off infections.  Your baby has a lower incidence of asthma, allergies, and sudden infant death syndrome.  The nutrients in breast milk are better for your baby than infant formulas and are designed uniquely for your baby's needs.  Breast milk improves your baby's brain development.  Your baby is less likely to develop other conditions, such as childhood obesity, asthma, or type 2 diabetes mellitus. For You  Breastfeeding helps to create a very special bond between you and your baby.  Breastfeeding is convenient. Breast milk is always available at the correct temperature and costs nothing.  Breastfeeding helps to burn calories  and helps you lose the weight gained during pregnancy.  Breastfeeding makes your uterus contract to its prepregnancy size faster and slows bleeding (lochia) after you give birth.   Breastfeeding helps to lower your risk of developing type 2 diabetes mellitus, osteoporosis, and breast or ovarian cancer later in life. SIGNS THAT YOUR BABY IS HUNGRY Early Signs of Hunger  Increased alertness or activity.  Stretching.  Movement of the head from side to side.  Movement of the head and opening of the mouth when the corner of the mouth or cheek is stroked (rooting).  Increased sucking sounds, smacking lips, cooing, sighing, or squeaking.  Hand-to-mouth movements.  Increased sucking of fingers or hands. Late Signs of Hunger  Fussing.  Intermittent crying. Extreme Signs of Hunger Signs of extreme hunger will require calming and consoling before your baby will be able to breastfeed successfully. Do not wait for the following signs of extreme hunger to occur before you initiate breastfeeding:  Restlessness.  A loud, strong cry.  Screaming. BREASTFEEDING BASICS Breastfeeding Initiation  Find a comfortable place to sit or lie down, with your neck and back well supported.  Place a  pillow or rolled up blanket under your baby to bring him or her to the level of your breast (if you are seated). Nursing pillows are specially designed to help support your arms and your baby while you breastfeed.  Make sure that your baby's abdomen is facing your abdomen.  Gently massage your breast. With your fingertips, massage from your chest wall toward your nipple in a circular motion. This encourages milk flow. You may need to continue this action during the feeding if your milk flows slowly.  Support your breast with 4 fingers underneath and your thumb above your nipple. Make sure your fingers are well away from your nipple and your baby's mouth.  Stroke your baby's lips gently with your finger or  nipple.  When your baby's mouth is open wide enough, quickly bring your baby to your breast, placing your entire nipple and as much of the colored area around your nipple (areola) as possible into your baby's mouth.  More areola should be visible above your baby's upper lip than below the lower lip.  Your baby's tongue should be between his or her lower gum and your breast.  Ensure that your baby's mouth is correctly positioned around your nipple (latched). Your baby's lips should create a seal on your breast and be turned out (everted).  It is common for your baby to suck about 2-3 minutes in order to start the flow of breast milk. Latching Teaching your baby how to latch on to your breast properly is very important. An improper latch can cause nipple pain and decreased milk supply for you and poor weight gain in your baby. Also, if your baby is not latched onto your nipple properly, he or she may swallow some air during feeding. This can make your baby fussy. Burping your baby when you switch breasts during the feeding can help to get rid of the air. However, teaching your baby to latch on properly is still the best way to prevent fussiness from swallowing air while breastfeeding. Signs that your baby has successfully latched on to your nipple:  Silent tugging or silent sucking, without causing you pain.  Swallowing heard between every 3-4 sucks.  Muscle movement above and in front of his or her ears while sucking. Signs that your baby has not successfully latched on to nipple:  Sucking sounds or smacking sounds from your baby while breastfeeding.  Nipple pain. If you think your baby has not latched on correctly, slip your finger into the corner of your baby's mouth to break the suction and place it between your baby's gums. Attempt breastfeeding initiation again. Signs of Successful Breastfeeding Signs from your baby:  A gradual decrease in the number of sucks or complete cessation of  sucking.  Falling asleep.  Relaxation of his or her body.  Retention of a small amount of milk in his or her mouth.  Letting go of your breast by himself or herself. Signs from you:  Breasts that have increased in firmness, weight, and size 1-3 hours after feeding.  Breasts that are softer immediately after breastfeeding.  Increased milk volume, as well as a change in milk consistency and color by the fifth day of breastfeeding.  Nipples that are not sore, cracked, or bleeding. Signs That Your Randel Books is Getting Enough Milk  Wetting at least 3 diapers in a 24-hour period. The urine should be clear and pale yellow by age 62 days.  At least 3 stools in a 24-hour period by age 66  days. The stool should be soft and yellow.  At least 3 stools in a 24-hour period by age 24 days. The stool should be seedy and yellow.  No loss of weight greater than 10% of birth weight during the first 73 days of age.  Average weight gain of 4-7 ounces (113-198 g) per week after age 44 days.  Consistent daily weight gain by age 34 days, without weight loss after the age of 2 weeks. After a feeding, your baby may spit up a small amount. This is common. BREASTFEEDING FREQUENCY AND DURATION Frequent feeding will help you make more milk and can prevent sore nipples and breast engorgement. Breastfeed when you feel the need to reduce the fullness of your breasts or when your baby shows signs of hunger. This is called "breastfeeding on demand." Avoid introducing a pacifier to your baby while you are working to establish breastfeeding (the first 4-6 weeks after your baby is born). After this time you may choose to use a pacifier. Research has shown that pacifier use during the first year of a baby's life decreases the risk of sudden infant death syndrome (SIDS). Allow your baby to feed on each breast as long as he or she wants. Breastfeed until your baby is finished feeding. When your baby unlatches or falls asleep while  feeding from the first breast, offer the second breast. Because newborns are often sleepy in the first few weeks of life, you may need to awaken your baby to get him or her to feed. Breastfeeding times will vary from baby to baby. However, the following rules can serve as a guide to help you ensure that your baby is properly fed:  Newborns (babies 72 weeks of age or younger) may breastfeed every 1-3 hours.  Newborns should not go longer than 3 hours during the day or 5 hours during the night without breastfeeding.  You should breastfeed your baby a minimum of 8 times in a 24-hour period until you begin to introduce solid foods to your baby at around 47 months of age. BREAST MILK PUMPING Pumping and storing breast milk allows you to ensure that your baby is exclusively fed your breast milk, even at times when you are unable to breastfeed. This is especially important if you are going back to work while you are still breastfeeding or when you are not able to be present during feedings. Your lactation consultant can give you guidelines on how long it is safe to store breast milk. A breast pump is a machine that allows you to pump milk from your breast into a sterile bottle. The pumped breast milk can then be stored in a refrigerator or freezer. Some breast pumps are operated by hand, while others use electricity. Ask your lactation consultant which type will work best for you. Breast pumps can be purchased, but some hospitals and breastfeeding support groups lease breast pumps on a monthly basis. A lactation consultant can teach you how to hand express breast milk, if you prefer not to use a pump. CARING FOR YOUR BREASTS WHILE YOU BREASTFEED Nipples can become dry, cracked, and sore while breastfeeding. The following recommendations can help keep your breasts moisturized and healthy:  Avoid using soap on your nipples.  Wear a supportive bra. Although not required, special nursing bras and tank tops are  designed to allow access to your breasts for breastfeeding without taking off your entire bra or top. Avoid wearing underwire-style bras or extremely tight bras.  Air dry your  nipples for 3-61minutes after each feeding.  Use only cotton bra pads to absorb leaked breast milk. Leaking of breast milk between feedings is normal.  Use lanolin on your nipples after breastfeeding. Lanolin helps to maintain your skin's normal moisture barrier. If you use pure lanolin, you do not need to wash it off before feeding your baby again. Pure lanolin is not toxic to your baby. You may also hand express a few drops of breast milk and gently massage that milk into your nipples and allow the milk to air dry. In the first few weeks after giving birth, some women experience extremely full breasts (engorgement). Engorgement can make your breasts feel heavy, warm, and tender to the touch. Engorgement peaks within 3-5 days after you give birth. The following recommendations can help ease engorgement:  Completely empty your breasts while breastfeeding or pumping. You may want to start by applying warm, moist heat (in the shower or with warm water-soaked hand towels) just before feeding or pumping. This increases circulation and helps the milk flow. If your baby does not completely empty your breasts while breastfeeding, pump any extra milk after he or she is finished.  Wear a snug bra (nursing or regular) or tank top for 1-2 days to signal your body to slightly decrease milk production.  Apply ice packs to your breasts, unless this is too uncomfortable for you.  Make sure that your baby is latched on and positioned properly while breastfeeding. If engorgement persists after 48 hours of following these recommendations, contact your health care provider or a Science writer. OVERALL HEALTH CARE RECOMMENDATIONS WHILE BREASTFEEDING  Eat healthy foods. Alternate between meals and snacks, eating 3 of each per day. Because  what you eat affects your breast milk, some of the foods may make your baby more irritable than usual. Avoid eating these foods if you are sure that they are negatively affecting your baby.  Drink milk, fruit juice, and water to satisfy your thirst (about 10 glasses a day).  Rest often, relax, and continue to take your prenatal vitamins to prevent fatigue, stress, and anemia.  Continue breast self-awareness checks.  Avoid chewing and smoking tobacco. Chemicals from cigarettes that pass into breast milk and exposure to secondhand smoke may harm your baby.  Avoid alcohol and drug use, including marijuana. Some medicines that may be harmful to your baby can pass through breast milk. It is important to ask your health care provider before taking any medicine, including all over-the-counter and prescription medicine as well as vitamin and herbal supplements. It is possible to become pregnant while breastfeeding. If birth control is desired, ask your health care provider about options that will be safe for your baby. SEEK MEDICAL CARE IF:  You feel like you want to stop breastfeeding or have become frustrated with breastfeeding.  You have painful breasts or nipples.  Your nipples are cracked or bleeding.  Your breasts are red, tender, or warm.  You have a swollen area on either breast.  You have a fever or chills.  You have nausea or vomiting.  You have drainage other than breast milk from your nipples.  Your breasts do not become full before feedings by the fifth day after you give birth.  You feel sad and depressed.  Your baby is too sleepy to eat well.  Your baby is having trouble sleeping.   Your baby is wetting less than 3 diapers in a 24-hour period.  Your baby has less than 3 stools in a  24-hour period.  Your baby's skin or the white part of his or her eyes becomes yellow.   Your baby is not gaining weight by 67 days of age. SEEK IMMEDIATE MEDICAL CARE IF:  Your baby  is overly tired (lethargic) and does not want to wake up and feed.  Your baby develops an unexplained fever.   This information is not intended to replace advice given to you by your health care provider. Make sure you discuss any questions you have with your health care provider.   Document Released: 03/16/2005 Document Revised: 12/05/2014 Document Reviewed: 09/07/2012 Elsevier Interactive Patient Education [employer].

## 2015-10-19 NOTE — Lactation Note (Signed)
This note was copied from a baby's chart. Lactation Consultation Note  Patient Name: Girl Jesa Matters [id number] Date: 10/19/2015 Reason for consult: Follow-up assessment;Infant < 6lbs;Other (Comment) (early term baby) This is Mom's 4th baby and she reports baby is nursing well. Mom feels her breasts are starting to fill. Has not been post pumping but reports she plans to power pump once getting home. Mom ready to go home, baby asleep, Mom reports she does not feel she needs assist with latch. Encouraged to continue to BF with feeding ques, waking baby as needed to BF, Baby has been to breast 11 times in past 24 hours. Continue to supplement after feedings till baby has Peds appointment on Monday. Encouraged to pump to encourage milk production, prevent engorgement and provide EBM to supplement. Monitor voids/stools. Engorgement care reviewed, breast milk storage guidelines page 25 of Baby N Me booklet. Advised of OP services, Mom reports her friend is LC and will be helping her as needed. Encouraged to call for questions/concerns.   Maternal Data    Feeding Feeding Type: Breast Fed Length of feed: 10 min  LATCH Score/Interventions                      Lactation Tools Discussed/Used     Consult Status Consult Status: Complete Date: 10/19/15 Follow-up type: In-patient    Katrine Coho 10/19/2015, 11:24 AM

## 2015-10-19 NOTE — Progress Notes (Signed)

## 2015-10-19 NOTE — Discharge Summary (Signed)
OB Discharge Summary     Patient Name: Tina Holder DOB: 04-25-1985 MRN: [id number]  Date of admission: 10/16/2015 Delivering MD: Serita Grammes   Date of discharge: 10/19/2015  Admitting diagnosis: IOL @ [redacted]wks Gestational Hypertension Intrauterine pregnancy: [redacted]w[redacted]d     Secondary diagnosis:  Active Problems:   Gestational hypertension w/o significant proteinuria in 3rd trimester  Additional problems: pregnancy after BTL, pre-pregnancy AUB     Discharge diagnosis: Term Pregnancy Delivered                                                                                                Post partum procedures:none  Augmentation: AROM and Cytotec  Complications: None  Hospital course:  Induction of Labor With Vaginal Delivery   30 y.o. yo [id number] at [redacted]w[redacted]d was admitted to the hospital 10/16/2015 for induction of labor.  Indication for induction: Gestational hypertension.  Patient had an uncomplicated labor course as follows: Membrane Rupture Time/Date: 1:25 AM ,10/17/2015   Intrapartum Procedures: Episiotomy: None [1]                                         Lacerations:  None [1]  Patient had delivery of a Viable infant.  Information for the patient's newborn:  Naja, Ruge Girl Jhordan [id number]  Delivery Method: Vaginal, Spontaneous Delivery (Filed from Delivery Summary)   10/17/2015  Details of delivery can be found in separate delivery note.  Patient had a routine postpartum course. Patient is discharged home 10/19/2015.   Physical exam  Filed Vitals:   10/18/15 1105 10/18/15 1848 10/18/15 2136 10/19/15 0549  BP: 132/70 131/77 129/66 131/89  Pulse: 64 56 59 52  Temp: 98 F (36.7 C) 97.6 F (36.4 C) 99.2 F (37.3 C) 98.3 F (36.8 C)  TempSrc: Oral Oral Oral Oral  Resp: 17 16 16 16   Height:      Weight:      SpO2: 98% 97% 99% 100%   General: alert, cooperative and no distress Lochia: appropriate Uterine Fundus: firm Incision: N/A DVT Evaluation: No evidence of DVT  seen on physical exam. Negative Homan's sign. No cords or calf tenderness. No significant calf/ankle edema. Labs: Lab Results  Component Value Date   WBC 5.2 10/16/2015   HGB 11.1* 10/16/2015   HCT 32.6* 10/16/2015   MCV 87.6 10/16/2015   PLT 197 10/16/2015   CMP Latest Ref Rng 10/16/2015  Glucose 65 - 99 mg/dL 109(H)  BUN 6 - 20 mg/dL 13  Creatinine 0.44 - 1.00 mg/dL 0.79  Sodium 135 - 145 mmol/L 135  Potassium 3.5 - 5.1 mmol/L 3.3(L)  Chloride 101 - 111 mmol/L 105  CO2 22 - 32 mmol/L 20(L)  Calcium 8.9 - 10.3 mg/dL 8.4(L)  Total Protein 6.5 - 8.1 g/dL 6.2(L)  Total Bilirubin 0.3 - 1.2 mg/dL 0.2(L)  Alkaline Phos 38 - 126 U/L 212(H)  AST 15 - 41 U/L 22  ALT 14 - 54 U/L 13(L)    Discharge instruction: per After Visit Summary and "Baby and  Me Booklet".  After visit meds:    Medication List    STOP taking these medications        butalbital-acetaminophen-caffeine 50-325-40 MG tablet  Commonly known as:  FIORICET, ESGIC     calcium carbonate 500 MG chewable tablet  Commonly known as:  TUMS - dosed in mg elemental calcium     cyclobenzaprine 10 MG tablet  Commonly known as:  FLEXERIL     metoCLOPramide 10 MG tablet  Commonly known as:  REGLAN     promethazine 25 MG tablet  Commonly known as:  PHENERGAN      TAKE these medications        albuterol 108 (90 Base) MCG/ACT inhaler  Commonly known as:  PROVENTIL HFA;VENTOLIN HFA  Inhale 2 puffs into the lungs every 6 (six) hours as needed for wheezing.     CHILDRENS GUMMIES PO  Take by mouth.     ibuprofen 600 MG tablet  Commonly known as:  ADVIL,MOTRIN  Take 1 tablet (600 mg total) by mouth every 6 (six) hours as needed for mild pain, moderate pain or cramping.        Diet: routine diet  Activity: Advance as tolerated. Pelvic rest for 6 weeks.   Outpatient follow up:6 weeks Follow up Appt:Future Appointments Date Time Provider [place]  11/01/2015 9:45 AM Paticia Stack, PA-C [facility]  [facility]   Follow up Visit:No Follow-up on file.  Postpartum contraception: abstinence until vaginal hysterectomy by Dr. Kennon Rounds as previously discussed d/t AUB  Newborn Data: Live born female  Birth Weight: 5 lb 5 oz (2410 g) APGAR: 8, 9  Baby Feeding: Breast Disposition:home with mother   10/19/2015 Tawnya Crook, CNM

## 2015-10-21 ENCOUNTER — Inpatient Hospital Stay (HOSPITAL_COMMUNITY)
Admission: AD | Admit: 2015-10-21 | Discharge: 2015-10-21 | Disposition: A | Discharge status: Home / Self Care | Payer: Medicaid Other | Source: Ambulatory Visit | Attending: Family Medicine | Admitting: Family Medicine

## 2015-10-21 ENCOUNTER — Telehealth: Payer: Self-pay | Admitting: *Deleted

## 2015-10-21 ENCOUNTER — Encounter (HOSPITAL_COMMUNITY): Payer: Self-pay

## 2015-10-21 DIAGNOSIS — K1120 Sialoadenitis, unspecified: Secondary | ICD-10-CM | POA: Diagnosis not present

## 2015-10-21 DIAGNOSIS — O9963 Diseases of the digestive system complicating the puerperium: Secondary | ICD-10-CM | POA: Diagnosis not present

## 2015-10-21 DIAGNOSIS — R220 Localized swelling, mass and lump, head: Secondary | ICD-10-CM | POA: Diagnosis not present

## 2015-10-21 DIAGNOSIS — R609 Edema, unspecified: Secondary | ICD-10-CM

## 2015-10-21 DIAGNOSIS — R221 Localized swelling, mass and lump, neck: Secondary | ICD-10-CM | POA: Diagnosis present

## 2015-10-21 LAB — CBC
HCT: 35.6 % — ABNORMAL LOW (ref 36.0–46.0)
Hemoglobin: 12.1 g/dL (ref 12.0–15.0)
MCH: 30.3 pg (ref 26.0–34.0)
MCHC: 34 g/dL (ref 30.0–36.0)
MCV: 89 fL (ref 78.0–100.0)
Platelets: 222 10*3/uL (ref 150–400)
RBC: 4 MIL/uL (ref 3.87–5.11)
RDW: 13.1 % (ref 11.5–15.5)
WBC: 4.7 10*3/uL (ref 4.0–10.5)

## 2015-10-21 LAB — URINALYSIS, ROUTINE W REFLEX MICROSCOPIC
Bilirubin Urine: NEGATIVE
Glucose, UA: NEGATIVE mg/dL
Ketones, ur: NEGATIVE mg/dL
LEUKOCYTES UA: NEGATIVE
NITRITE: NEGATIVE
PROTEIN: NEGATIVE mg/dL
Specific Gravity, Urine: 1.01 (ref 1.005–1.030)
pH: 7 (ref 5.0–8.0)

## 2015-10-21 LAB — URINE MICROSCOPIC-ADD ON: BACTERIA UA: NONE SEEN

## 2015-10-21 LAB — TSH: TSH: 2.449 u[IU]/mL (ref 0.350–4.500)

## 2015-10-21 MED ORDER — AMOXICILLIN-POT CLAVULANATE 875-125 MG PO TABS: 1.0000 | ORAL_TABLET | Freq: Two times a day (BID) | ORAL | 0 refills | Status: AC

## 2015-10-21 NOTE — MAU Provider Note (Signed)
Chief Complaint:  Lymphadenopathy  Provider saw patient at 1800  HPI: Tina Holder is a 30 y.o. [id number] who presents to maternity admissions reporting lump on right side of neck for 2 days. States had the same thing 3 months ago.  Was told it was parotid gland stones.  Tender to touch, but no sore throat or ear pain. No congestion or fever. Has family hx thyroid issues, worried about that. . She reports normal postpartum vaginal bleeding, no vaginal itching/burning, urinary symptoms, h/a, dizziness, n/v, or fever/chills.    Neck Pain   This is a recurrent problem. The current episode started in the past 7 days. The problem occurs constantly. The problem has been unchanged. The pain is associated with nothing. The pain is present in the right side. The quality of the pain is described as aching. The pain is moderate. The symptoms are aggravated by swallowing. The pain is same all the time. Associated symptoms include pain with swallowing. Pertinent negatives include no fever or headaches. Treatments tried: sour candy, ibuprofen. The treatment provided no relief.   RN Note: Delivered vaginally on Thurs.Came home from hosp on Sat.  Within a couple hours, neck felt sore and lymph node was swollen.  Feeling some anxiety.  Had same thing happen a few months ago Past Medical History: Past Medical History:  Diagnosis Date  . Headache(784.0)   . Obesity   . Pregnancy induced hypertension     Past obstetric history: OB History  Gravida Para Term Preterm AB Living  4 4 4  0 0 4  SAB TAB Ectopic Multiple Live Births  0 0 0 0      # Outcome Date GA Lbr Len/2nd Weight Sex Delivery Anes PTL Lv  4 Term 10/17/15 [redacted]w[redacted]d 04:28 / 00:16 2.41 kg (5 lb 5 oz) F Vag-Spont EPI  LIV  3 Term 01/01/12 [redacted]w[redacted]d 02:02 / 00:15 3.204 kg (7 lb 1 oz) M Vag-Spont EPI  LIV     Birth Comments: WDL  2 Term 12/28/08 [redacted]w[redacted]d  2.807 kg (6 lb 3 oz) M Vag-Spont None N LIV     Birth Comments: No complications  1 Term [id number] [redacted]w[redacted]d   1.928 kg (4 lb 4 oz) M Vag-Spont None  LIV     Birth Comments: Induced for pre eclempsia/ intrauterine growth restriction/bedrest for 3 weeks.      Past Surgical History: Past Surgical History:  Procedure Laterality Date  . TONSILLECTOMY  2005  . TUBAL LIGATION  01/01/2012   Procedure: POST PARTUM TUBAL LIGATION;  Surgeon: Lavonia Drafts, MD;  Location: [facility];  Service: Gynecology;  Laterality: Bilateral;  with filshie clips  . WISDOM TOOTH EXTRACTION      Family History: Family History  Problem Relation Age of Onset  . Arthritis Mother   . Thyroid disease Maternal Grandmother   . Lupus Maternal Grandmother     Social History: Social History  Substance Use Topics  . Smoking status: Never Smoker  . Smokeless tobacco: Never Used  . Alcohol use No    Allergies: No Known Allergies  Meds:  Prescriptions Prior to Admission  Medication Sig Dispense Refill Last Dose  . butalbital-acetaminophen-caffeine (FIORICET, ESGIC) 50-325-40 MG tablet Take 1 tablet by mouth every 4 (four) hours as needed for headache.   Past Week at Unknown time  . Calcium Carbonate Antacid (ALKA-SELTZER ANTACID PO) Take 1 tablet by mouth daily as needed (For heartburn.).   Past Month at Unknown time  . ibuprofen (ADVIL,MOTRIN) 600 MG tablet Take  1 tablet (600 mg total) by mouth every 6 (six) hours as needed for mild pain, moderate pain or cramping. 30 tablet 0 10/20/2015 at Unknown time  . Pediatric Multivit-Minerals-C (CHILDRENS GUMMIES PO) Take 2 each by mouth daily.    10/20/2015 at Unknown time  . albuterol (PROVENTIL HFA;VENTOLIN HFA) 108 (90 Base) MCG/ACT inhaler Inhale 2 puffs into the lungs every 6 (six) hours as needed for wheezing. 1 Inhaler 2 Rescue    I have reviewed patient's Past Medical Hx, Surgical Hx, Family Hx, Social Hx, medications and allergies.  ROS:  Review of Systems  Constitutional: Negative for chills and fever.  HENT: Negative for congestion, ear pain, postnasal drip,  sinus pressure and sore throat (but hurts to swallow w eating).   Respiratory: Negative for shortness of breath.        Lump right side of neck  Gastrointestinal: Negative for abdominal pain.  Musculoskeletal: Positive for neck pain. Negative for neck stiffness.  Neurological: Negative for speech difficulty and headaches.  Hematological: Negative for adenopathy.   Other systems negative     Physical Exam  Patient Vitals for the past 24 hrs:  BP Temp Temp src Pulse Resp  10/21/15 1809 141/76 - - (!) 55 -  10/21/15 1748 151/91 - - (!) 57 18  10/21/15 1448 138/85 98.1 F (36.7 C) Oral 68 18   Constitutional: Well-developed, well-nourished female in no acute distress.  HEENT:  Right neck mass which is firm, tender. About 3-4cm just under mandible Cardiovascular: normal rate and rhythm, no ectopy audible, S1 & S2 heard, no murmur Respiratory: normal effort, no distress. Lungs CTAB with no wheezes or crackles GI: Abd soft, non-tender.  Nondistended.  No rebound, No guarding.  Bowel Sounds audible  MS: Extremities nontender, no edema, normal ROM Neurologic: Alert and oriented x 4.   Grossly nonfocal. GU: Neg CVAT. Skin:  Warm and Dry Psych:  Affect appropriate.     Labs: Results for orders placed or performed during the hospital encounter of 10/21/15 (from the past 24 hour(s))  Urinalysis, Routine w reflex microscopic (not at [facility])     Status: Abnormal   Collection Time: 10/21/15  2:50 PM  Result Value Ref Range   Color, Urine YELLOW YELLOW   APPearance CLEAR CLEAR   Specific Gravity, Urine 1.010 1.005 - 1.030   pH 7.0 5.0 - 8.0   Glucose, UA NEGATIVE NEGATIVE mg/dL   Hgb urine dipstick LARGE (A) NEGATIVE   Bilirubin Urine NEGATIVE NEGATIVE   Ketones, ur NEGATIVE NEGATIVE mg/dL   Protein, ur NEGATIVE NEGATIVE mg/dL   Nitrite NEGATIVE NEGATIVE   Leukocytes, UA NEGATIVE NEGATIVE  Urine microscopic-add on     Status: Abnormal   Collection Time: 10/21/15  2:50 PM  Result Value  Ref Range   Squamous Epithelial / LPF 0-5 (A) NONE SEEN   WBC, UA 0-5 0 - 5 WBC/hpf   RBC / HPF 0-5 0 - 5 RBC/hpf   Bacteria, UA NONE SEEN NONE SEEN  CBC     Status: Abnormal   Collection Time: 10/21/15  7:49 PM  Result Value Ref Range   WBC 4.7 4.0 - 10.5 K/uL   RBC 4.00 3.87 - 5.11 MIL/uL   Hemoglobin 12.1 12.0 - 15.0 g/dL   HCT 35.6 (L) 36.0 - 46.0 %   MCV 89.0 78.0 - 100.0 fL   MCH 30.3 26.0 - 34.0 pg   MCHC 34.0 30.0 - 36.0 g/dL   RDW 13.1 11.5 - 15.5 %  Platelets 222 150 - 400 K/uL  TSH     Status: None   Collection Time: 10/21/15  7:52 PM  Result Value Ref Range   TSH 2.449 0.350 - 4.500 uIU/mL    Imaging:  No results found.  MAU Course/MDM: I have ordered labs as follows:  CBC, TSH   >>>>>  Both normal Imaging ordered: none  Results reviewed.   Consult Dr Baron Sane. He examined her and agrees it is likely a parotiditis.  Possible parotid stones.Will treat with Augmentin 875mg  bid.  Will check TSH due to family hx and possible ectopic thyroid tissue (not likely)  >>  normal .   Treatments in MAU included Labs.   Pt stable at time of discharge.  Assessment: Postpartum RIght Parotiditis Possible parotid stones  Plan: Discharge home Recommend continue sour candy, PO fluids Rx sent for Augmentin  for parotiditis Follow up in clinic for postpartum exam or ER for worsening of symptoms    Medication List    ASK your doctor about these medications   albuterol 108 (90 Base) MCG/ACT inhaler Commonly known as:  PROVENTIL HFA;VENTOLIN HFA Inhale 2 puffs into the lungs every 6 (six) hours as needed for wheezing.   ALKA-SELTZER ANTACID PO Take 1 tablet by mouth daily as needed (For heartburn.).   butalbital-acetaminophen-caffeine 50-325-40 MG tablet Commonly known as:  FIORICET, ESGIC Take 1 tablet by mouth every 4 (four) hours as needed for headache.   CHILDRENS GUMMIES PO Take 2 each by mouth daily.   ibuprofen 600 MG tablet Commonly known as:   ADVIL,MOTRIN Take 1 tablet (600 mg total) by mouth every 6 (six) hours as needed for mild pain, moderate pain or cramping.      Encouraged to return here or to other Urgent Care/ED if she develops worsening of symptoms, increase in pain, fever, or other concerning symptoms.   Hansel Feinstein CNM, MSN Certified Nurse-Midwife 10/21/2015 6:14 PM

## 2015-10-21 NOTE — Discharge Instructions (Signed)
Parotitis Parotitis is soreness and inflammation of one or both parotid glands. The parotid glands produce saliva. They are located on each side of the face, below and in front of the earlobes. The saliva produced comes out of tiny openings (ducts) inside the cheeks. In most cases, parotitis goes away over time or with treatment. If your parotitis is caused by certain long-term (chronic) diseases, it may come back again.  CAUSES  Parotitis can be caused by:  Viral infections. Mumps is one viral infection that can cause parotitis.  Bacterial infections.  Blockage of the salivary ducts due to a salivary stone.  Narrowing of the salivary ducts.  Swelling of the salivary ducts.  Dehydration.  Autoimmune conditions, such as sarcoidosis or Sjogren syndrome.  Air from activities such as scuba diving, glass blowing, or playing an instrument (rare).  Human immunodeficiency virus (HIV) or acquired immunodeficiency syndrome (AIDS).  Tuberculosis. SIGNS AND SYMPTOMS   The ears may appear to be pushed up and out from their normal position.  Redness (erythema) of the skin over the parotid glands.  Pain and tenderness over the parotid glands.  Swelling in the parotid gland area.  Yellowish-white fluid (pus) coming from the ducts inside the cheeks.  Dry mouth.  Bad taste in the mouth. DIAGNOSIS  Your health care provider may determine that you have parotitis based on your symptoms and a physical exam. A sample of fluid may also be taken from the parotid gland and tested to find the cause of your infection. X-rays or computed tomography (CT) scans may be taken if your health care provider thinks you might have a salivary stone blocking your salivary duct. TREATMENT  Treatment varies depending upon the cause of your parotitis. If your parotitis is caused by mumps, no treatment is needed. The condition will go away on its own after 7 to 10 days. In other cases, treatment may  include:  Antibiotic medicine if your infection was caused by bacteria.  Pain medicines.  Gland massage.  Eating sour candy to increase your saliva production.  Removal of salivary stones. Your health care provider may flush stones out with fluids or remove them with tweezers.  Surgery to remove the parotid glands. HOME CARE INSTRUCTIONS   If you were prescribed an antibiotic medicine, finish it all even if you start to feel better.  Put warm compresses on the sore area.  Take medicines only as directed by your health care provider.  Drink enough fluids to keep your urine clear or pale yellow. SEEK IMMEDIATE MEDICAL CARE IF:   You have increasing pain or swelling that is not controlled with medicine.  You have a fever. MAKE SURE YOU:  Understand these instructions.  Will watch your condition.  Will get help right away if you are not doing well or get worse.   This information is not intended to replace advice given to you by your health care provider. Make sure you discuss any questions you have with your health care provider.   Document Released: 09/05/2001 Document Revised: 04/06/2014 Document Reviewed: 08/09/2014 Elsevier Interactive Patient Education 2016 Elsevier Inc.  

## 2015-10-21 NOTE — Telephone Encounter (Signed)
-----   Message from Francia Greaves sent at 10/21/2015  8:37 AM EDT ----- Regarding: Advise Contact: [phone] Having swelling under her neck, thinks it may be her thyroid, states this happened while she was pregnant, wants to know what to do

## 2015-10-21 NOTE — MAU Note (Signed)
Delivered vaginally on Thurs.Came home from hosp on Sat.  Within a couple hours, neck felt sore and lymph node was swollen.  Feeling some anxiety.  Had same thing happen a few months ago.

## 2015-10-21 NOTE — Telephone Encounter (Signed)
Pt delivered on 10-17-15 and called the office this morning c/o swelling noted in her neck and experiencing SOB.  Has a history of asthma but states this feels different from the asthma exacerbation.  Informed pt that after delivery you are at risk of developing a blood clot that sometimes can move to the lungs so she needed to go to MAU for evaluation and to rule out.  Pt acknowledged and stated that she would go to the hospital.

## 2015-11-01 ENCOUNTER — Encounter: Payer: Medicaid Other | Admitting: Physician Assistant

## 2015-11-13 ENCOUNTER — Telehealth: Payer: Self-pay | Admitting: *Deleted

## 2015-11-13 DIAGNOSIS — G43009 Migraine without aura, not intractable, without status migrainosus: Secondary | ICD-10-CM

## 2015-11-13 MED ORDER — METOCLOPRAMIDE HCL 10 MG PO TABS: 10.0000 mg | ORAL_TABLET | Freq: Three times a day (TID) | ORAL | 1 refills | Status: DC | PRN

## 2015-11-13 MED ORDER — METOCLOPRAMIDE HCL 10 MG PO TABS: ORAL_TABLET | ORAL | 1 refills | Status: DC

## 2015-11-13 NOTE — Telephone Encounter (Signed)
Received fax from pharmacy for refill request for Reglan 10 mg, sent refill to pharmacy.  Pt has follow-up scheduled in October.

## 2015-11-22 ENCOUNTER — Ambulatory Visit (INDEPENDENT_AMBULATORY_CARE_PROVIDER_SITE_OTHER): Payer: Medicaid Other | Admitting: Family Medicine

## 2015-11-22 DIAGNOSIS — Z30011 Encounter for initial prescription of contraceptive pills: Secondary | ICD-10-CM

## 2015-11-22 MED ORDER — NORETHINDRONE 0.35 MG PO TABS: 1.0000 | ORAL_TABLET | Freq: Every day | ORAL | 11 refills | Status: DC

## 2015-11-22 NOTE — Patient Instructions (Signed)
Oral Contraception Use Oral contraceptive pills (OCPs) are medicines taken to prevent pregnancy. OCPs work by preventing the ovaries from releasing eggs. The hormones in OCPs also cause the cervical mucus to thicken, preventing the sperm from entering the uterus. The hormones also cause the uterine lining to become thin, not allowing a fertilized egg to attach to the inside of the uterus. OCPs are highly effective when taken exactly as prescribed. However, OCPs do not prevent sexually transmitted diseases (STDs). Safe sex practices, such as using condoms along with an OCP, can help prevent STDs. Before taking OCPs, you may have a physical exam and Pap test. Your health care provider may also order blood tests if necessary. Your health care provider will make sure you are a good candidate for oral contraception. Discuss with your health care provider the possible side effects of the OCP you may be prescribed. When starting an OCP, it can take 2 to 3 months for the body to adjust to the changes in hormone levels in your body.  HOW TO TAKE ORAL CONTRACEPTIVE PILLS Your health care provider may advise you on how to start taking the first cycle of OCPs. Otherwise, you can:   Start on day 1 of your menstrual period. You will not need any backup contraceptive protection with this start time.   Start on the first Sunday after your menstrual period or the day you get your prescription. In these cases, you will need to use backup contraceptive protection for the first week.   Start the pill at any time of your cycle. If you take the pill within 5 days of the start of your period, you are protected against pregnancy right away. In this case, you will not need a backup form of birth control. If you start at any other time of your menstrual cycle, you will need to use another form of birth control for 7 days. If your OCP is the type called a minipill, it will protect you from pregnancy after taking it for 2 days (48  hours). After you have started taking OCPs:   If you forget to take 1 pill, take it as soon as you remember. Take the next pill at the regular time.   If you miss 2 or more pills, call your health care provider because different pills have different instructions for missed doses. Use backup birth control until your next menstrual period starts.   If you use a 28-day pack that contains inactive pills and you miss 1 of the last 7 pills (pills with no hormones), it will not matter. Throw away the rest of the non-hormone pills and start a new pill pack.  No matter which day you start the OCP, you will always start a new pack on that same day of the week. Have an extra pack of OCPs and a backup contraceptive method available in case you miss some pills or lose your OCP pack.  HOME CARE INSTRUCTIONS   Do not smoke.   Always use a condom to protect against STDs. OCPs do not protect against STDs.   Use a calendar to mark your menstrual period days.   Read the information and directions that came with your OCP. Talk to your health care provider if you have questions.  SEEK MEDICAL CARE IF:   You develop nausea and vomiting.   You have abnormal vaginal discharge or bleeding.   You develop a rash.   You miss your menstrual period.   You are losing   your hair.   You need treatment for mood swings or depression.   You get dizzy when taking the OCP.   You develop acne from taking the OCP.   You become pregnant.  SEEK IMMEDIATE MEDICAL CARE IF:   You develop chest pain.   You develop shortness of breath.   You have an uncontrolled or severe headache.   You develop numbness or slurred speech.   You develop visual problems.   You develop pain, redness, and swelling in the legs.    This information is not intended to replace advice given to you by your health care provider. Make sure you discuss any questions you have with your health care provider.   Document  Released: 03/05/2011 Document Revised: 04/06/2014 Document Reviewed: 09/04/2012 Elsevier Interactive Patient Education 2016 Elsevier Inc.  

## 2015-11-22 NOTE — Progress Notes (Signed)
Post Partum Exam  Tina Holder is a 30 y.o. [phone] female who presents for a postpartum visit. She is 5 weeks postpartum following a spontaneous vaginal delivery. I have fully reviewed the prenatal and intrapartum course. The delivery was at [redacted]w[redacted]d gestational weeks.  Anesthesia: epidural. Postpartum course has been unremarkable. Baby's course has been unremarkable. Baby is feeding by breast. Bleeding no bleeding. Bowel function is abnormal: hemorrhoids. Bladder function is normal. Patient is not sexually active. Contraception method is none. Postpartum depression screening: Negative - Score=1  The following portions of the patient's history were reviewed and updated as appropriate: allergies, current medications, past family history, past medical history, past social history, past surgical history and problem list.  Review of Systems Pertinent items noted in HPI and remainder of comprehensive ROS otherwise negative.   Objective:    BP 116/78 mmHg  Pulse 78  Resp 16  Ht 5\' 5"  (1.651 m)  Wt 211 lb (95.709 kg)  BMI 35.11 kg/m2  Breastfeeding? Yes  General:  alert, cooperative and appears stated age  Abdomen: soft, non-tender; bowel sounds normal; no masses,  no organomegaly        Assessment:    Normal postpartum exam. Pap smear not done at today's visit.   Plan:   1. Contraception: oral progesterone-only contraceptive 2. For possible TVH in the future 3. Follow up in: 6 months or as needed.

## 2016-01-03 ENCOUNTER — Encounter: Payer: Medicaid Other | Admitting: Physician Assistant

## 2016-07-23 ENCOUNTER — Ambulatory Visit (INDEPENDENT_AMBULATORY_CARE_PROVIDER_SITE_OTHER): Payer: Self-pay | Admitting: Obstetrics and Gynecology

## 2016-07-23 ENCOUNTER — Encounter: Payer: Self-pay | Admitting: Obstetrics and Gynecology

## 2016-07-23 VITALS — BP 112/79 | Temp 97.7°F | Ht 62.0 in | Wt 184.0 lb

## 2016-07-23 DIAGNOSIS — N61 Mastitis without abscess: Secondary | ICD-10-CM

## 2016-07-23 MED ORDER — CEPHALEXIN 500 MG PO CAPS: 500.0000 mg | ORAL_CAPSULE | Freq: Four times a day (QID) | ORAL | 0 refills | Status: DC

## 2016-07-23 NOTE — Progress Notes (Signed)
31 yo G4P4 here for evaluation of her right breast. She reports a week of right breast tenderness with overlying skin erythema. Patient has applied warm compresses and taken warm showers without improvement in her symptoms. She denies fever or chills. She has continued with breast feeding and pumping  Past Medical History:  Diagnosis Date  . Headache(784.0)   . Obesity   . Pregnancy induced hypertension    Past Surgical History:  Procedure Laterality Date  . TONSILLECTOMY  2005  . TUBAL LIGATION  01/01/2012   Procedure: POST PARTUM TUBAL LIGATION;  Surgeon: Lavonia Drafts, MD;  Location[facility];  Service: Gynecology;  Laterality: Bilateral;  with filshie clips  . WISDOM TOOTH EXTRACTION     Family History  Problem Relation Age of Onset  . Arthritis Mother   . Thyroid disease Maternal Grandmother   . Lupus Maternal Grandmother    Social History  Substance Use Topics  . Smoking status: Never Smoker  . Smokeless tobacco: Never Used  . Alcohol use No   ROS See pertinent in HPI  Blood pressure 112/79, temperature 97.7 F (36.5 C), temperature source Oral, height 5\' 2"  (1.575 m), weight 184 lb (83.5 kg), currently breastfeeding. GENERAL: Well-developed, well-nourished female in no acute distress.  BREASTS: Symmetric in size. No palpable masses or lymphadenopathy. Skin erythema on right near areola from 8-10 o'clock. No palpable abscess.  ABDOMEN: Soft, nontender, nondistended. EXTREMITIES: No cyanosis, clubbing, or edema, 2+ distal pulses.   A/P 31 yo with right breast mastitis - Rx Keflex provided - Advised to continue with warm compresses - RTC in 3 days if no improvement in symptoms

## 2016-07-28 ENCOUNTER — Telehealth: Payer: Self-pay | Admitting: *Deleted

## 2016-07-28 NOTE — Telephone Encounter (Signed)
-----   Message from Blanchie Dessert, [place] sent at 07/27/2016 10:24 AM EDT ----- Regarding: update Contact: [phone] Pt called stating that the tenderness is gone but still has a knot, wanted to call and let you know since you requested that she call Monday, will continue antibiotics.

## 2016-07-28 NOTE — Telephone Encounter (Signed)
Spoke to pt, has two more days left of the antibiotics, informed pt to complete the antibiotics and to continue to apply warm compresses to the area where the knot is located.  Instructed to call back if she experiences fever, chills, or the size increases. Pt acknowledged instructions.

## 2016-08-03 ENCOUNTER — Telehealth: Payer: Self-pay | Admitting: *Deleted

## 2016-08-03 DIAGNOSIS — B379 Candidiasis, unspecified: Secondary | ICD-10-CM

## 2016-08-03 MED ORDER — FLUCONAZOLE 150 MG PO TABS: 150.0000 mg | ORAL_TABLET | ORAL | 3 refills | Status: DC

## 2016-08-03 NOTE — Telephone Encounter (Signed)
Pt called in stating she has yeast infection from taking abx for mastitis. Per Dr Harolyn Rutherford sent diflucan to pt pharmacy. Tried calling pt back to advise but no answer and couldn't LMOM.

## 2016-08-07 ENCOUNTER — Ambulatory Visit: Payer: Medicaid Other | Admitting: Obstetrics and Gynecology

## 2016-09-01 ENCOUNTER — Telehealth: Payer: Self-pay | Admitting: *Deleted

## 2016-09-01 DIAGNOSIS — G43009 Migraine without aura, not intractable, without status migrainosus: Secondary | ICD-10-CM

## 2016-09-01 MED ORDER — BUTALBITAL-APAP-CAFFEINE 50-325-40 MG PO TABS: 1.0000 | ORAL_TABLET | ORAL | 0 refills | Status: DC | PRN

## 2016-09-01 NOTE — Telephone Encounter (Signed)
-----   Message from Blanchie Dessert, [place] sent at 09/01/2016  1:05 PM EDT ----- Regarding: pt would like Rx refill Contact: [phone] Pt was seen last June by [facility], she does not have any insurance and can't really afford to come see her again. She has a few days before its actually been a year since her last appt w [facility]. She wanted to know if there was any way that she could get a [facility] [place]

## 2016-09-01 NOTE — Telephone Encounter (Signed)
One refill for the Fioricet sent to pt pharmacy per Allie Dimmer order.

## 2018-02-15 ENCOUNTER — Encounter (HOSPITAL_COMMUNITY): Payer: Self-pay

## 2018-02-15 ENCOUNTER — Other Ambulatory Visit (HOSPITAL_COMMUNITY)
Admission: RE | Admit: 2018-02-15 | Discharge: 2018-02-15 | Disposition: A | Discharge status: Home / Self Care | Payer: Medicaid Other | Source: Ambulatory Visit | Attending: Family Medicine | Admitting: Family Medicine

## 2018-02-15 ENCOUNTER — Encounter: Payer: Self-pay | Admitting: Family Medicine

## 2018-02-15 ENCOUNTER — Ambulatory Visit (INDEPENDENT_AMBULATORY_CARE_PROVIDER_SITE_OTHER): Payer: Medicaid Other | Admitting: Family Medicine

## 2018-02-15 VITALS — BP 111/77 | HR 72 | Wt 199.2 lb

## 2018-02-15 DIAGNOSIS — Z124 Encounter for screening for malignant neoplasm of cervix: Secondary | ICD-10-CM | POA: Insufficient documentation

## 2018-02-15 DIAGNOSIS — Z0000 Encounter for general adult medical examination without abnormal findings: Secondary | ICD-10-CM | POA: Diagnosis not present

## 2018-02-15 DIAGNOSIS — N920 Excessive and frequent menstruation with regular cycle: Secondary | ICD-10-CM | POA: Insufficient documentation

## 2018-02-15 DIAGNOSIS — N921 Excessive and frequent menstruation with irregular cycle: Secondary | ICD-10-CM

## 2018-02-15 DIAGNOSIS — Z01411 Encounter for gynecological examination (general) (routine) with abnormal findings: Secondary | ICD-10-CM

## 2018-02-15 NOTE — Patient Instructions (Signed)
Preventive Care 18-39 Years, Female Preventive care refers to lifestyle choices and visits with your health care provider that can promote health and wellness. What does preventive care include?  A yearly physical exam. This is also called an annual well check.  Dental exams once or twice a year.  Routine eye exams. Ask your health care provider how often you should have your eyes checked.  Personal lifestyle choices, including: ? Daily care of your teeth and gums. ? Regular physical activity. ? Eating a healthy diet. ? Avoiding tobacco and drug use. ? Limiting alcohol use. ? Practicing safe sex. ? Taking vitamin and mineral supplements as recommended by your health care provider. What happens during an annual well check? The services and screenings done by your health care provider during your annual well check will depend on your age, overall health, lifestyle risk factors, and family history of disease. Counseling Your health care provider may ask you questions about your:  Alcohol use.  Tobacco use.  Drug use.  Emotional well-being.  Home and relationship well-being.  Sexual activity.  Eating habits.  Work and work Statistician.  Method of birth control.  Menstrual cycle.  Pregnancy history.  Screening You may have the following tests or measurements:  Height, weight, and BMI.  Diabetes screening. This is done by checking your blood sugar (glucose) after you have not eaten for a while (fasting).  Blood pressure.  Lipid and cholesterol levels. These may be checked every 5 years starting at age 38.  Skin check.  Hepatitis C blood test.  Hepatitis B blood test.  Sexually transmitted disease (STD) testing.  BRCA-related cancer screening. This may be done if you have a family history of breast, ovarian, tubal, or peritoneal cancers.  Pelvic exam and Pap test. This may be done every 3 years starting at age 38. Starting at age 30, this may be done  every 5 years if you have a Pap test in combination with an HPV test.  Discuss your test results, treatment options, and if necessary, the need for more tests with your health care provider. Vaccines Your health care provider may recommend certain vaccines, such as:  Influenza vaccine. This is recommended every year.  Tetanus, diphtheria, and acellular pertussis (Tdap, Td) vaccine. You may need a Td booster every 10 years.  Varicella vaccine. You may need this if you have not been vaccinated.  HPV vaccine. If you are 39 or younger, you may need three doses over 6 months.  Measles, mumps, and rubella (MMR) vaccine. You may need at least one dose of MMR. You may also need a second dose.  Pneumococcal 13-valent conjugate (PCV13) vaccine. You may need this if you have certain conditions and were not previously vaccinated.  Pneumococcal polysaccharide (PPSV23) vaccine. You may need one or two doses if you smoke cigarettes or if you have certain conditions.  Meningococcal vaccine. One dose is recommended if you are age 68-21 years and a first-year college student living in a residence hall, or if you have one of several medical conditions. You may also need additional booster doses.  Hepatitis A vaccine. You may need this if you have certain conditions or if you travel or work in places where you may be exposed to hepatitis A.  Hepatitis B vaccine. You may need this if you have certain conditions or if you travel or work in places where you may be exposed to hepatitis B.  Haemophilus influenzae type b (Hib) vaccine. You may need this  if you have certain risk factors.  Talk to your health care provider about which screenings and vaccines you need and how often you need them. This information is not intended to replace advice given to you by your health care provider. Make sure you discuss any questions you have with your health care provider. Document Released: 05/12/2001 Document Revised:  12/04/2015 Document Reviewed: 01/15/2015 Elsevier Interactive Patient Education  2018 Elsevier Inc.  

## 2018-02-15 NOTE — Progress Notes (Signed)
   Last pap 2015- normal Abnormal periods  Does not want birth control at this time Tubal 12/2011

## 2018-02-15 NOTE — Assessment & Plan Note (Signed)
Unclear etiology--strongly desires hysterectomy. SVD x 4 previously. Has not been a candidate for other forms of cycle control due to migraines with all hormones. Is s/p BTL which failed. Could not undergo endometrial ablation without repeat BTL, which she is not interested in. FPW will likely not cover procedure. She states she will self-pay. Will attempt to schedule and let pre-service center call with her down-payment needs and see how far we get.

## 2018-02-15 NOTE — Progress Notes (Signed)
  Subjective:     Tina Holder is a 32 y.o. female and is here for a comprehensive physical exam. The patient reports problems - heavy cycles. She is s/p BTL 2013, with failure and pregnancy in 2013. Cycles are now coming q 1-2 months. They last 5-10 days and are heavy. Soaking through pad + tampon. Strongly desires hysterectomy.  Has terrible migraines on every form of birth control.   The following portions of the patient's history were reviewed and updated as appropriate: allergies, current medications, past family history, past medical history, past social history, past surgical history and problem list.  Review of Systems Pertinent items noted in HPI and remainder of comprehensive ROS otherwise negative.   Objective:    BP 111/77   Pulse 72   Wt 199 lb 3.2 oz (90.4 kg)   LMP 12/28/2017   BMI 36.43 kg/m  General appearance: alert, cooperative and appears stated age Head: Normocephalic, without obvious abnormality, atraumatic Neck: no adenopathy, supple, symmetrical, trachea midline and thyroid not enlarged, symmetric, no tenderness/mass/nodules Lungs: clear to auscultation bilaterally Breasts: normal appearance, no masses or tenderness Heart: regular rate and rhythm, S1, S2 normal, no murmur, click, rub or gallop Abdomen: soft, non-tender; bowel sounds normal; no masses,  no organomegaly Pelvic: cervix normal in appearance, external genitalia normal, no adnexal masses or tenderness, no cervical motion tenderness, uterus normal size, shape, and consistency and vagina normal without discharge Extremities: extremities normal, atraumatic, no cyanosis or edema Pulses: 2+ and symmetric Skin: Skin color, texture, turgor normal. No rashes or lesions Lymph nodes: Cervical, supraclavicular, and axillary nodes normal. Neurologic: Grossly normal    Assessment:    Healthy female exam.      Plan:      Problem List Items Addressed This Visit      Unprioritized   Menorrhagia   Unclear etiology--strongly desires hysterectomy. SVD x 4 previously. Has not been a candidate for other forms of cycle control due to migraines with all hormones. Is s/p BTL which failed. Could not undergo endometrial ablation without repeat BTL, which she is not interested in. FPW will likely not cover procedure. She states she will self-pay. Will attempt to schedule and let pre-service center call with her down-payment needs and see how far we get.       Other Visit Diagnoses    Encounter for gynecological examination with abnormal finding    -  Primary   Screening for malignant neoplasm of cervix       Relevant Orders   Cytology - PAP     Return in 1 year (on 02/16/2019).   See After Visit Summary for Counseling Recommendations

## 2018-02-15 NOTE — Progress Notes (Signed)
Patient reports abnormal periods since having her tubal six years ago. She is wearing a tampon and a pad and changing every 1.5 hours. She does not want STI testing today. Declined Flu

## 2018-02-16 LAB — CYTOLOGY - PAP
Diagnosis: NEGATIVE
HPV: NOT DETECTED

## 2018-03-31 NOTE — Patient Instructions (Addendum)
Your procedure is scheduled on: Tuesday, 04/12/18  Enter through the [facility] at: 6 am  Pick up the phone at the desk and dial [phone].  Call this number if you have problems the morning of surgery: [phone].  Remember: Do NOT eat food or Do NOT drink clear liquids (including water) after midnight Monday.  Take these medicines the morning of surgery with a SIP OF WATER: None  Brush your teeth on the day of surgery.  Stop herbal medications, vitamin supplements, Ibuprofen/NSAIDS at this time. (Ok to use Tylenol).  Do NOT wear jewelry (body piercing), metal hair clips/bobby pins, make-up, or nail polish. Do NOT wear lotions, powders, or perfumes.  You may wear deoderant. Do NOT shave for 48 hours prior to surgery. Do NOT bring valuables to the hospital. Contacts may not be worn into surgery.  Leave suitcase in car.  After surgery it may be brought to your room.  For patients admitted to the hospital, checkout time is 11:00 AM the day of discharge. Have a responsible adult drive you home and stay with you for 24 hours after your procedure.  Home with husband Tina Holder cell [phone].

## 2018-03-31 NOTE — H&P (Signed)
  Tina Holder is an 33 y.o. [phone] female.   Chief Complaint: Heavy cycles HPI: Notes cycles have gotten heavier since BTL. She notes h/o migraine headache with any other form of contraception and declines more minimally invasive procedures.  Past Medical History:  Diagnosis Date  . Headache(784.0)   . Obesity   . Pregnancy induced hypertension     Past Surgical History:  Procedure Laterality Date  . TONSILLECTOMY  2005  . TUBAL LIGATION  01/01/2012   Procedure: POST PARTUM TUBAL LIGATION;  Surgeon: Lavonia Drafts, MD;  Location: [facility];  Service: Gynecology;  Laterality: Bilateral;  with filshie clips  . WISDOM TOOTH EXTRACTION      Family History  Problem Relation Age of Onset  . Arthritis Mother   . Thyroid disease Maternal Grandmother   . Lupus Maternal Grandmother    Social History:  reports that she has never smoked. She has never used smokeless tobacco. She reports that she does not drink alcohol or use drugs.  Allergies: No Known Allergies  No medications prior to admission.    A comprehensive review of systems was negative.  currently breastfeeding. General appearance: alert, cooperative and appears stated age Head: Normocephalic, without obvious abnormality, atraumatic Neck: supple, symmetrical, trachea midline Lungs: normal effort Heart: regular rate and rhythm Abdomen: soft, non-tender; bowel sounds normal; no masses,  no organomegaly Extremities: Homans sign is negative, no sign of DVT Skin: Skin color, texture, turgor normal. No rashes or lesions Neurologic: Grossly normal   Lab Results  Component Value Date   WBC 4.7 10/21/2015   HGB 12.1 10/21/2015   HCT 35.6 (L) 10/21/2015   MCV 89.0 10/21/2015   PLT 222 10/21/2015     Assessment/Plan Principal Problem:   Menorrhagia  For TVH, with possible bilateral salpingectomy if possible. Risks include but are not limited to bleeding, infection, injury to surrounding structures, including  bowel, bladder and ureters, blood clots, and death.  Likelihood of success is high.    [name] 03/31/2018, 4:23 PM

## 2018-04-04 ENCOUNTER — Encounter (HOSPITAL_COMMUNITY)
Admission: RE | Admit: 2018-04-04 | Discharge: 2018-04-04 | Disposition: A | Discharge status: Home / Self Care | Payer: Medicaid Other | Source: Ambulatory Visit | Attending: Family Medicine | Admitting: Family Medicine

## 2018-04-04 ENCOUNTER — Encounter (HOSPITAL_COMMUNITY): Payer: Self-pay | Admitting: *Deleted

## 2018-04-04 ENCOUNTER — Other Ambulatory Visit: Payer: Self-pay

## 2018-04-04 DIAGNOSIS — Z01812 Encounter for preprocedural laboratory examination: Secondary | ICD-10-CM | POA: Insufficient documentation

## 2018-04-04 LAB — CBC
HEMATOCRIT: 43.7 % (ref 36.0–46.0)
Hemoglobin: 14.9 g/dL (ref 12.0–15.0)
MCH: 31.4 pg (ref 26.0–34.0)
MCHC: 34.1 g/dL (ref 30.0–36.0)
MCV: 92 fL (ref 80.0–100.0)
NRBC: 0 % (ref 0.0–0.2)
Platelets: 209 10*3/uL (ref 150–400)
RBC: 4.75 MIL/uL (ref 3.87–5.11)
RDW: 12.4 % (ref 11.5–15.5)
WBC: 5.5 10*3/uL (ref 4.0–10.5)

## 2018-04-11 ENCOUNTER — Encounter (HOSPITAL_COMMUNITY): Payer: Self-pay | Admitting: Anesthesiology

## 2018-04-11 NOTE — Anesthesia Preprocedure Evaluation (Addendum)
Anesthesia Evaluation  Patient identified by MRN, date of birth, ID band Patient awake    Reviewed: Allergy & Precautions, NPO status , Patient's Chart, lab work & pertinent test results  Airway Mallampati: I       Dental no notable dental hx. (+) Teeth Intact   Pulmonary asthma ,    Pulmonary exam normal breath sounds clear to auscultation       Cardiovascular hypertension, Normal cardiovascular exam Rhythm:Regular Rate:Normal     Neuro/Psych    GI/Hepatic Neg liver ROS,   Endo/Other  negative endocrine ROS  Renal/GU negative Renal ROS  negative genitourinary   Musculoskeletal negative musculoskeletal ROS (+)   Abdominal (+) + obese,   Peds  Hematology negative hematology ROS (+)   Anesthesia Other Findings   Reproductive/Obstetrics negative OB ROS                            Anesthesia Physical Anesthesia Plan  ASA: II  Anesthesia Plan: General   Post-op Pain Management:    Induction: Intravenous  PONV Risk Score and Plan: 4 or greater and Midazolam, Scopolamine patch - Pre-op, Ondansetron and Dexamethasone  Airway Management Planned: LMA  Additional Equipment:   Intra-op Plan:   Post-operative Plan: Extubation in OR  Informed Consent: I have reviewed the patients History and Physical, chart, labs and discussed the procedure including the risks, benefits and alternatives for the proposed anesthesia with the patient or authorized representative who has indicated his/her understanding and acceptance.     Plan Discussed with: CRNA  Anesthesia Plan Comments:        Anesthesia Quick Evaluation

## 2018-04-12 ENCOUNTER — Ambulatory Visit (HOSPITAL_COMMUNITY): Payer: Medicaid Other | Admitting: Anesthesiology

## 2018-04-12 ENCOUNTER — Other Ambulatory Visit: Payer: Self-pay

## 2018-04-12 ENCOUNTER — Encounter (HOSPITAL_COMMUNITY): Payer: Self-pay | Admitting: Emergency Medicine

## 2018-04-12 ENCOUNTER — Encounter (HOSPITAL_COMMUNITY)
Admission: RE | Disposition: A | Discharge status: Home / Self Care | Payer: Self-pay | Source: Ambulatory Visit | Attending: Family Medicine

## 2018-04-12 ENCOUNTER — Ambulatory Visit (HOSPITAL_COMMUNITY)
Admission: RE | Admit: 2018-04-12 | Discharge: 2018-04-13 | Disposition: A | Discharge status: Home / Self Care | Payer: Medicaid Other | Source: Ambulatory Visit | Attending: Family Medicine | Admitting: Family Medicine

## 2018-04-12 DIAGNOSIS — Z90710 Acquired absence of both cervix and uterus: Secondary | ICD-10-CM | POA: Diagnosis present

## 2018-04-12 DIAGNOSIS — N920 Excessive and frequent menstruation with regular cycle: Secondary | ICD-10-CM

## 2018-04-12 DIAGNOSIS — Z79899 Other long term (current) drug therapy: Secondary | ICD-10-CM | POA: Diagnosis not present

## 2018-04-12 DIAGNOSIS — R51 Headache: Secondary | ICD-10-CM | POA: Diagnosis not present

## 2018-04-12 HISTORY — PX: VAGINAL HYSTERECTOMY: SHX2639

## 2018-04-12 LAB — CBC
HCT: 29.1 % — ABNORMAL LOW (ref 36.0–46.0)
Hemoglobin: 9.9 g/dL — ABNORMAL LOW (ref 12.0–15.0)
MCH: 31.6 pg (ref 26.0–34.0)
MCHC: 34 g/dL (ref 30.0–36.0)
MCV: 93 fL (ref 80.0–100.0)
PLATELETS: 263 10*3/uL (ref 150–400)
RBC: 3.13 MIL/uL — ABNORMAL LOW (ref 3.87–5.11)
RDW: 12.7 % (ref 11.5–15.5)
WBC: 15.5 10*3/uL — ABNORMAL HIGH (ref 4.0–10.5)
nRBC: 0 % (ref 0.0–0.2)

## 2018-04-12 LAB — PREGNANCY, URINE: Preg Test, Ur: NEGATIVE

## 2018-04-12 SURGERY — HYSTERECTOMY, VAGINAL
Anesthesia: General | Laterality: Right

## 2018-04-12 MED ORDER — GLYCOPYRROLATE 0.2 MG/ML IJ SOLN
INTRAMUSCULAR | Status: DC | PRN
  Administered 2018-04-12: 0.2 mg via INTRAVENOUS

## 2018-04-12 MED ORDER — ONDANSETRON HCL 4 MG/2ML IJ SOLN
INTRAMUSCULAR | Status: DC | PRN
  Administered 2018-04-12: 4 mg via INTRAVENOUS

## 2018-04-12 MED ORDER — PROPOFOL 10 MG/ML IV BOLUS
INTRAVENOUS | Status: DC | PRN
  Administered 2018-04-12: 200 mg via INTRAVENOUS

## 2018-04-12 MED ORDER — HYDROMORPHONE HCL 1 MG/ML IJ SOLN
INTRAMUSCULAR | Status: AC
  Administered 2018-04-12: 0.5 mg via INTRAVENOUS
  Filled 2018-04-12: qty 0.5

## 2018-04-12 MED ORDER — MIDAZOLAM HCL 2 MG/2ML IJ SOLN
INTRAMUSCULAR | Status: AC
  Filled 2018-04-12: qty 2

## 2018-04-12 MED ORDER — OXYCODONE-ACETAMINOPHEN 5-325 MG PO TABS: 1.0000 | ORAL_TABLET | Freq: Four times a day (QID) | ORAL | 0 refills | Status: DC | PRN

## 2018-04-12 MED ORDER — HYDROMORPHONE HCL 1 MG/ML IJ SOLN
INTRAMUSCULAR | Status: AC
  Filled 2018-04-12: qty 0.5

## 2018-04-12 MED ORDER — ESTRADIOL 0.1 MG/GM VA CREA
TOPICAL_CREAM | VAGINAL | Status: AC
  Filled 2018-04-12: qty 42.5

## 2018-04-12 MED ORDER — DEXAMETHASONE SODIUM PHOSPHATE 10 MG/ML IJ SOLN
INTRAMUSCULAR | Status: DC | PRN
  Administered 2018-04-12: 4 mg via INTRAVENOUS

## 2018-04-12 MED ORDER — PROMETHAZINE HCL 25 MG/ML IJ SOLN: 6.2500 mg | INTRAMUSCULAR | Status: DC | PRN

## 2018-04-12 MED ORDER — ONDANSETRON HCL 4 MG PO TABS: 4.0000 mg | ORAL_TABLET | Freq: Four times a day (QID) | ORAL | Status: DC | PRN

## 2018-04-12 MED ORDER — SCOPOLAMINE 1 MG/3DAYS TD PT72
1.0000 | MEDICATED_PATCH | Freq: Once | TRANSDERMAL | Status: DC
  Administered 2018-04-12: 1.5 mg via TRANSDERMAL

## 2018-04-12 MED ORDER — ROCURONIUM BROMIDE 100 MG/10ML IV SOLN
INTRAVENOUS | Status: AC
  Filled 2018-04-12: qty 1

## 2018-04-12 MED ORDER — ONDANSETRON HCL 4 MG/2ML IJ SOLN
INTRAMUSCULAR | Status: AC
  Filled 2018-04-12: qty 2

## 2018-04-12 MED ORDER — SUGAMMADEX SODIUM 200 MG/2ML IV SOLN
INTRAVENOUS | Status: AC
  Filled 2018-04-12: qty 2

## 2018-04-12 MED ORDER — CELECOXIB 400 MG PO CAPS
400.0000 mg | ORAL_CAPSULE | Freq: Every day | ORAL | Status: DC
  Administered 2018-04-12: 400 mg via ORAL
  Filled 2018-04-12 (×2): qty 1

## 2018-04-12 MED ORDER — MIDAZOLAM HCL 2 MG/2ML IJ SOLN
INTRAMUSCULAR | Status: DC | PRN
  Administered 2018-04-12: 2 mg via INTRAVENOUS

## 2018-04-12 MED ORDER — EPHEDRINE 5 MG/ML INJ
10.0000 mg | Freq: Once | INTRAVENOUS | Status: AC
  Administered 2018-04-12: 10 mg via INTRAVENOUS
  Filled 2018-04-12: qty 2

## 2018-04-12 MED ORDER — FENTANYL CITRATE (PF) 100 MCG/2ML IJ SOLN
INTRAMUSCULAR | Status: DC | PRN
  Administered 2018-04-12 (×4): 50 ug via INTRAVENOUS

## 2018-04-12 MED ORDER — LIDOCAINE-EPINEPHRINE 1 %-1:100000 IJ SOLN
INTRAMUSCULAR | Status: AC
  Filled 2018-04-12: qty 1

## 2018-04-12 MED ORDER — CEFAZOLIN SODIUM-DEXTROSE 2-4 GM/100ML-% IV SOLN
2.0000 g | INTRAVENOUS | Status: AC
  Administered 2018-04-12: 2 g via INTRAVENOUS

## 2018-04-12 MED ORDER — SCOPOLAMINE 1 MG/3DAYS TD PT72
MEDICATED_PATCH | TRANSDERMAL | Status: AC
  Administered 2018-04-12: 1.5 mg via TRANSDERMAL
  Filled 2018-04-12: qty 1

## 2018-04-12 MED ORDER — LIDOCAINE HCL (PF) 1 % IJ SOLN
INTRAMUSCULAR | Status: AC
  Filled 2018-04-12: qty 5

## 2018-04-12 MED ORDER — DOCUSATE SODIUM 100 MG PO CAPS
100.0000 mg | ORAL_CAPSULE | Freq: Two times a day (BID) | ORAL | Status: DC
  Administered 2018-04-12 – 2018-04-13 (×2): 100 mg via ORAL
  Filled 2018-04-12 (×2): qty 1

## 2018-04-12 MED ORDER — GABAPENTIN 100 MG PO CAPS
100.0000 mg | ORAL_CAPSULE | Freq: Three times a day (TID) | ORAL | Status: DC
  Administered 2018-04-12 (×2): 100 mg via ORAL
  Filled 2018-04-12 (×3): qty 1

## 2018-04-12 MED ORDER — GLYCOPYRROLATE 0.2 MG/ML IJ SOLN
INTRAMUSCULAR | Status: AC
  Filled 2018-04-12: qty 1

## 2018-04-12 MED ORDER — KETOROLAC TROMETHAMINE 30 MG/ML IJ SOLN
INTRAMUSCULAR | Status: AC
  Filled 2018-04-12: qty 1

## 2018-04-12 MED ORDER — LIDOCAINE HCL (CARDIAC) PF 100 MG/5ML IV SOSY
PREFILLED_SYRINGE | INTRAVENOUS | Status: DC | PRN
  Administered 2018-04-12: 50 mg via INTRAVENOUS

## 2018-04-12 MED ORDER — OXYCODONE HCL 5 MG PO TABS
5.0000 mg | ORAL_TABLET | ORAL | Status: DC | PRN
  Administered 2018-04-12: 10 mg via ORAL
  Administered 2018-04-12 – 2018-04-13 (×2): 5 mg via ORAL
  Administered 2018-04-13: 10 mg via ORAL
  Filled 2018-04-12 (×2): qty 1
  Filled 2018-04-12 (×2): qty 2

## 2018-04-12 MED ORDER — FENTANYL CITRATE (PF) 250 MCG/5ML IJ SOLN
INTRAMUSCULAR | Status: AC
  Filled 2018-04-12: qty 5

## 2018-04-12 MED ORDER — MENTHOL 3 MG MT LOZG: 1.0000 | LOZENGE | OROMUCOSAL | Status: DC | PRN

## 2018-04-12 MED ORDER — LACTATED RINGERS IV SOLN
INTRAVENOUS | Status: DC
  Administered 2018-04-12: 07:00:00 via INTRAVENOUS

## 2018-04-12 MED ORDER — HYDROMORPHONE HCL 1 MG/ML IJ SOLN
0.2500 mg | INTRAMUSCULAR | Status: DC | PRN
  Administered 2018-04-12 (×2): 0.5 mg via INTRAVENOUS

## 2018-04-12 MED ORDER — DEXTROSE IN LACTATED RINGERS 5 % IV SOLN
INTRAVENOUS | Status: DC
  Administered 2018-04-12: 13:00:00 via INTRAVENOUS

## 2018-04-12 MED ORDER — KETOROLAC TROMETHAMINE 30 MG/ML IJ SOLN: 30.0000 mg | Freq: Once | INTRAMUSCULAR | Status: DC | PRN

## 2018-04-12 MED ORDER — DEXAMETHASONE SODIUM PHOSPHATE 4 MG/ML IJ SOLN
INTRAMUSCULAR | Status: AC
  Filled 2018-04-12: qty 1

## 2018-04-12 MED ORDER — LIDOCAINE HCL (CARDIAC) PF 100 MG/5ML IV SOSY
PREFILLED_SYRINGE | INTRAVENOUS | Status: AC
  Filled 2018-04-12: qty 5

## 2018-04-12 MED ORDER — LIDOCAINE-EPINEPHRINE 1 %-1:100000 IJ SOLN
INTRAMUSCULAR | Status: DC | PRN
  Administered 2018-04-12: 20 mL

## 2018-04-12 MED ORDER — MEPERIDINE HCL 25 MG/ML IJ SOLN: 6.2500 mg | INTRAMUSCULAR | Status: DC | PRN

## 2018-04-12 MED ORDER — KETOROLAC TROMETHAMINE 30 MG/ML IJ SOLN
INTRAMUSCULAR | Status: DC | PRN
  Administered 2018-04-12: 30 mg via INTRAVENOUS

## 2018-04-12 MED ORDER — CEFAZOLIN SODIUM-DEXTROSE 2-4 GM/100ML-% IV SOLN
INTRAVENOUS | Status: AC
  Filled 2018-04-12: qty 100

## 2018-04-12 MED ORDER — KETOROLAC TROMETHAMINE 30 MG/ML IJ SOLN: 30.0000 mg | Freq: Once | INTRAMUSCULAR | Status: DC

## 2018-04-12 MED ORDER — HYDROMORPHONE HCL 1 MG/ML IJ SOLN: 0.2000 mg | INTRAMUSCULAR | Status: DC | PRN

## 2018-04-12 MED ORDER — 0.9 % SODIUM CHLORIDE (POUR BTL) OPTIME
TOPICAL | Status: DC | PRN
  Administered 2018-04-12: 1000 mL

## 2018-04-12 MED ORDER — ONDANSETRON HCL 4 MG/2ML IJ SOLN
4.0000 mg | Freq: Four times a day (QID) | INTRAMUSCULAR | Status: DC | PRN
  Administered 2018-04-12: 4 mg via INTRAVENOUS
  Filled 2018-04-12: qty 2

## 2018-04-12 MED ORDER — PROPOFOL 10 MG/ML IV BOLUS
INTRAVENOUS | Status: AC
  Filled 2018-04-12: qty 20

## 2018-04-12 MED ORDER — LACTATED RINGERS IV SOLN
INTRAVENOUS | Status: DC
  Administered 2018-04-12 (×3): via INTRAVENOUS

## 2018-04-12 SURGICAL SUPPLY — 25 items
CANISTER SUCT 3000ML PPV (MISCELLANEOUS) ×2 IMPLANT
CONT PATH 16OZ SNAP LID 3702 (MISCELLANEOUS) IMPLANT
DECANTER SPIKE VIAL GLASS SM (MISCELLANEOUS) IMPLANT
GAUZE PACKING 2X5 YD STRL (GAUZE/BANDAGES/DRESSINGS) IMPLANT
GLOVE BIOGEL PI IND STRL 6.5 (GLOVE) ×1 IMPLANT
GLOVE BIOGEL PI IND STRL 7.0 (GLOVE) ×2 IMPLANT
GLOVE BIOGEL PI INDICATOR 6.5 (GLOVE) ×1
GLOVE BIOGEL PI INDICATOR 7.0 (GLOVE) ×2
GLOVE ECLIPSE 7.0 STRL STRAW (GLOVE) ×4 IMPLANT
GOWN STRL REUS W/TWL LRG LVL3 (GOWN DISPOSABLE) ×10 IMPLANT
HIBICLENS CHG 4% 4OZ BTL (MISCELLANEOUS) ×2 IMPLANT
NDL SPNL 18GX3.5 QUINCKE PK (NEEDLE) ×1 IMPLANT
NEEDLE HYPO 22GX1.5 SAFETY (NEEDLE) IMPLANT
NEEDLE SPNL 18GX3.5 QUINCKE PK (NEEDLE) ×2 IMPLANT
NS IRRIG 1000ML POUR BTL (IV SOLUTION) ×2 IMPLANT
PACK VAGINAL WOMENS (CUSTOM PROCEDURE TRAY) ×2 IMPLANT
PAD OB MATERNITY 4.3X12.25 (PERSONAL CARE ITEMS) ×2 IMPLANT
SUT VIC AB 0 CT1 18XCR BRD8 (SUTURE) ×3 IMPLANT
SUT VIC AB 0 CT1 27 (SUTURE) ×2
SUT VIC AB 0 CT1 27XBRD ANBCTR (SUTURE) ×2 IMPLANT
SUT VIC AB 0 CT1 8-18 (SUTURE) ×3
SUT VICRYL 0 TIES 12 18 (SUTURE) ×2 IMPLANT
SYR 20CC LL (SYRINGE) ×2 IMPLANT
TOWEL OR 17X24 6PK STRL BLUE (TOWEL DISPOSABLE) ×4 IMPLANT
TRAY FOLEY W/BAG SLVR 14FR (SET/KITS/TRAYS/PACK) ×2 IMPLANT

## 2018-04-12 NOTE — Anesthesia Procedure Notes (Signed)
Procedure Name: LMA Insertion Date/Time: 04/12/2018 7:32 AM Performed by: Bufford Spikes, CRNA Pre-anesthesia Checklist: Patient identified, Emergency Drugs available, Suction available and Patient being monitored Patient Re-evaluated:Patient Re-evaluated prior to induction Oxygen Delivery Method: Circle system utilized Preoxygenation: Pre-oxygenation with 100% oxygen Induction Type: IV induction Ventilation: Mask ventilation without difficulty LMA: LMA inserted LMA Size: 4.0 Number of attempts: 1 Placement Confirmation: positive ETCO2 Tube secured with: Tape Dental Injury: Teeth and Oropharynx as per pre-operative assessment

## 2018-04-12 NOTE — Transfer of Care (Signed)
Immediate Anesthesia Transfer of Care Note  Patient: Tina Holder  Procedure(s) Performed: HYSTERECTOMY VAGINAL WITH RIGHT SALPINGECTOMY (Right )  Patient Location: PACU  Anesthesia Type:General  Level of Consciousness: awake, alert  and oriented  Airway & Oxygen Therapy: Patient Spontanous Breathing and Patient connected to nasal cannula oxygen  Post-op Assessment: Report given to RN and Post -op Vital signs reviewed and stable  Post vital signs: Reviewed and stable  Last Vitals:  Vitals Value Taken Time  BP 113/86 04/12/2018  8:19 AM  Temp    Pulse 77 04/12/2018  8:22 AM  Resp 10 04/12/2018  8:22 AM  SpO2 98 % 04/12/2018  8:22 AM  Vitals shown include unvalidated device data.  Last Pain:  Vitals:   04/12/18 0634  TempSrc: Oral      Patients Stated Pain Goal: 4 (28/41/32 4401)  Complications: No apparent anesthesia complications

## 2018-04-12 NOTE — Plan of Care (Signed)
Patient admitted to 318 for post op care.

## 2018-04-12 NOTE — Discharge Instructions (Signed)
Vaginal Hysterectomy, Care After  Refer to this sheet in the next few weeks. These instructions provide you with information about caring for yourself after your procedure. Your health care provider may also give you more specific instructions. Your treatment has been planned according to current medical practices, but problems sometimes occur. Call your health care provider if you have any problems or questions after your procedure.  What can I expect after the procedure?  After the procedure, it is common to have:  · Pain.  · Soreness and numbness in your incision areas.  · Vaginal bleeding and discharge.  · Constipation.  · Temporary problems emptying the bladder.  · Feelings of sadness or other emotions.  Follow these instructions at home:  Medicines  · Take over-the-counter and prescription medicines only as told by your health care provider.  · If you were prescribed an antibiotic medicine, take it as told by your health care provider. Do not stop taking the antibiotic even if you start to feel better.  · Do not drive or operate heavy machinery while taking prescription pain medicine.  Activity  · Return to your normal activities as told by your health care provider. Ask your health care provider what activities are safe for you.  · Get regular exercise as told by your health care provider. You may be told to take short walks every day and go farther each time.  · Do not lift anything that is heavier than 10 lb (4.5 kg).  General instructions    · Do not put anything in your vagina for 6 weeks after your surgery or as told by your health care provider. This includes tampons and douches.  · Do not have sex until your health care provider says you can.  · Do not take baths, swim, or use a hot tub until your health care provider approves.  · Drink enough fluid to keep your urine clear or pale yellow.  · Do not drive for 24 hours if you were given a sedative.  · Keep all follow-up visits as told by your health  care provider. This is important.  Contact a health care provider if:  · Your pain medicine is not helping.  · You have a fever.  · You have redness, swelling, or pain at your incision site.  · You have blood, pus, or a bad-smelling discharge from your vagina.  · You continue to have difficulty urinating.  Get help right away if:  · You have severe abdominal or back pain.  · You have heavy bleeding from your vagina.  · You have chest pain or shortness of breath.  This information is not intended to replace advice given to you by your health care provider. Make sure you discuss any questions you have with your health care provider.  Document Released: 07/08/2015 Document Revised: 08/22/2015 Document Reviewed: 03/31/2015  Elsevier Interactive Patient Education © 2019 Elsevier Inc.

## 2018-04-12 NOTE — Progress Notes (Signed)
Pt transferred to OB high risk via stretcher.  Patient slid self onto bed in room.  VS taken by NT.  BP 70/47, HR 61, 100% on 2 L.  Patient stating she feels very sleepy, otherwise, no other issues.  Dr Jillyn Hidden notified and orders received.

## 2018-04-12 NOTE — Interval H&P Note (Signed)
History and Physical Interval Note:  04/12/2018 7:07 AM  Tina Holder  has presented today for surgery, with the diagnosis of Abnormal Cycles  The various methods of treatment have been discussed with the patient and family. After consideration of risks, benefits and other options for treatment, the patient has consented to  Procedure(s): HYSTERECTOMY VAGINAL WITH SALPINGECTOMY (Bilateral) as a surgical intervention .  The patient's history has been reviewed, patient examined, no change in status, stable for surgery.  I have reviewed the patient's chart and labs.  Questions were answered to the patient's satisfaction.     [name]

## 2018-04-12 NOTE — Op Note (Addendum)
Preoperative diagnosis: Menorrhagia  Postoperative diagnosis: Same  Procedure: Transvaginal hysterectomy, right salpingectomy  Surgeon: Standley Dakins. Kennon Rounds, M.D.  Assistant: Clovia Cuff, MD An experienced assistant was required given the standard of surgical care given the complexity of the case.  This assistant was needed for exposure, dissection, suctioning, retraction, instrument exchange, and for overall help during the procedure.  Anesthesia: General -,Lyn Hollingshead, MD, MD  Findings: Normal appearing uterus, right tube and ovary, non-visualized left tube and ovary.  Estimated blood loss: 250 cc  Specimen: Uterus, right tube to pathology  Reason for procedure: Patient has h/o bleeding following BTL. She declines other options for control of bleeding.  Risks of  hysterectomy reviewed.  Risks include but are not limited to bleeding, infection, injury to surrounding structures, including bowel, bladder and ureters, blood clots, and death.  Likelihood of success of surgery is high.   Procedure: Patient was taken to the OR where she was placed in dorsal lithotomy in [place]. She was prepped and draped in the usual sterile fashion. A timeout was performed. The patient received 1 g of Ancef prior to procedure. The patient had SCDs in place.  A speculum was placed inside the vagina. The cervix was visualized and grasped with 2 doublle-tooth tenacula. 20 cc of 1% lidocaine with epinephrine were injected paracervically. A knife was used to make a circumferential incision around the vagina. An opened sponge was used to dissect the vagina off the cervix. The posterior peritoneum was entered sharply with this incision. The posterior peritoneum was tagged to the vaginal cuff with a single stitch. The anterior peritoneal cavity was entered sharply with careful dissection of the bladder off the underlying cervix. A Heaney clamp was used to clamp first the left uterosacral ligament and cardinal which  was then cut and Haney suture ligated with 0 Vicryl stitch, the stitch was held. Similarly the right uterosacral ligament was clamped cut and suture ligated.  Sequential bites up the broad to the uterine arteries were taken until the tubo-ovarian pedicles were encountered. The uterus was then inverted and the left utero-ovarian pedicle grasped with a Heaney clamp. The right utero-ovarian pedicle was similarly grasped with the Heaney clamp. The specimen removed. The right tube and ovary were easily visualized. The left ovary and tube were not seen. The tubo-ovarian pedicles were tied with free tie then suture ligature.  The right tube was  grasped with a Babcock clamp and a Tonsil clamp used to clamp behind this. The tube was removed and pedicle secured with suture ligature. Inspection of all pedicles revealed adequate hemostasis. The vagina was closed with 0 Vicryl suture in a locked running fashion with care taken to incorporate the uterosacral pedicles. Excellent hemostasis was noted at the end of the case. The vaginal cuff was inspected there was minimal bleeding noted.  A Foley catheter is placed inside her bladder. Clear, yellow urine was noted. All instrument needle and lap counts were correct x 2. Patient was awakened taken to recovery room in stable condition.  [name], MD 04/12/2018, 8:15 AM

## 2018-04-12 NOTE — Anesthesia Postprocedure Evaluation (Signed)
Anesthesia Post Note  Patient: Tina Holder  Procedure(s) Performed: HYSTERECTOMY VAGINAL WITH RIGHT SALPINGECTOMY (Right )     Patient location during evaluation: PACU Anesthesia Type: General Level of consciousness: awake Pain management: pain level controlled Vital Signs Assessment: post-procedure vital signs reviewed and stable Respiratory status: spontaneous breathing Cardiovascular status: stable Postop Assessment: no apparent nausea or vomiting Anesthetic complications: no    Last Vitals:  Vitals:   04/12/18 0945 04/12/18 1000  BP: 102/62 101/67  Pulse: 67 70  Resp: 19 18  Temp:    SpO2: 98% 100%    Last Pain:  Vitals:   04/12/18 1000  TempSrc:   PainSc: 5    Pain Goal: Patients Stated Pain Goal: 4 (04/12/18 9689)              @[id number] (12500)  )Huston Foley

## 2018-04-12 NOTE — OR Nursing (Signed)
Patient's glasses removed upon entry to OR. Placed in protected eyewear holder and labeled with patient sticker. Glasses placed in patient's belongings bag on stretcher and will be transported to PACU with patient.

## 2018-04-13 ENCOUNTER — Encounter (HOSPITAL_COMMUNITY): Payer: Self-pay | Admitting: Family Medicine

## 2018-04-13 ENCOUNTER — Encounter (HOSPITAL_COMMUNITY): Payer: Self-pay

## 2018-04-13 DIAGNOSIS — N920 Excessive and frequent menstruation with regular cycle: Secondary | ICD-10-CM | POA: Diagnosis not present

## 2018-04-13 NOTE — Progress Notes (Signed)
Patient discharged home with significant other. Discharge teaching, home care, prescriptions, and follow-up discussed. Pt verbalized understanding.

## 2018-04-13 NOTE — Discharge Summary (Signed)
Physician Discharge Summary  Patient ID: Tina Holder MRN: [id number] DOB/AGE: [birth year] 34 y.o.  Admit date: 04/12/2018 Discharge date:   Admission Diagnoses:  Principal Problem:   Menorrhagia Active Problems:   Status post hysterectomy   Discharge Diagnoses:  Same  Past Medical History:  Diagnosis Date  . Headache(784.0)    OTC med prn  . Obesity   . Pregnancy induced hypertension 2009, 2017   resolved after pregnancy  . SVD (spontaneous vaginal delivery)    x 4    Surgeries: Procedure(s): HYSTERECTOMY VAGINAL WITH RIGHT SALPINGECTOMY on 04/12/2018   Consultants:   Discharged Condition: Improved  Hospital Course: Tina Holder is an 33 y.o. female [id number] who was admitted 04/12/2018 with a chief complaint of abnormal bleeding, and found to have a diagnosis of Menorrhagia.  She were brought to the operating room on 04/12/2018 and underwent the above named procedures.    She was given perioperative antibiotics:  Anti-infectives (From admission, onward)   Start     Dose/Rate Route Frequency Ordered Stop   04/12/18 0630  ceFAZolin (ANCEF) IVPB 2g/100 mL premix     2 g 200 mL/hr over 30 Minutes Intravenous On call to O.R. 04/12/18 [id number] 04/12/18 0720   04/12/18 0623  ceFAZolin (ANCEF) 2-4 GM/100ML-% IVPB    Note to Pharmacy:  Boykin Reaper   : cabinet override      04/12/18 3817 04/12/18 0720    .   She was given sequential compression devices, early ambulation, and chemoprophylaxis for DVT prophylaxis.  She benefited maximally from their hospital stay and there were no complications. She was ambulating, voiding, tolerating po and deemed stable for discharge.  Recent vital signs:  Vitals:   04/12/18 2344 04/13/18 0440  BP: (!) 93/55 (!) 97/52  Pulse: 94 83  Resp: 18 17  Temp: 98.2 F (36.8 C) 98.7 F (37.1 C)  SpO2: 98% 96%   Discharge exam: Physical Examination: General appearance - alert, well appearing, and in no distress Chest - normal  effort Heart - normal rate and regular rhythm Abdomen - soft, appropriately tender, dressing is clean and dry Extremities - peripheral pulses normal, no pedal edema, no clubbing or cyanosis, Homan's sign negative bilaterally Recent laboratory studies:  Results for orders placed or performed during the hospital encounter of 04/12/18  Pregnancy, urine  Result Value Ref Range   Preg Test, Ur NEGATIVE NEGATIVE  CBC  Result Value Ref Range   WBC 15.5 (H) 4.0 - 10.5 K/uL   RBC 3.13 (L) 3.87 - 5.11 MIL/uL   Hemoglobin 9.9 (L) 12.0 - 15.0 g/dL   HCT 29.1 (L) 36.0 - 46.0 %   MCV 93.0 80.0 - 100.0 fL   MCH 31.6 26.0 - 34.0 pg   MCHC 34.0 30.0 - 36.0 g/dL   RDW 12.7 11.5 - 15.5 %   Platelets 263 150 - 400 K/uL   nRBC 0.0 0.0 - 0.2 %    Discharge Medications:   Allergies as of 04/13/2018   No Known Allergies     Medication List    TAKE these medications   acetaminophen 500 MG tablet Commonly known as:  TYLENOL Take 1,000 mg by mouth every 6 (six) hours as needed for moderate pain or headache.   oxyCODONE-acetaminophen 5-325 MG tablet Commonly known as:  PERCOCET/ROXICET Take 1-2 tablets by mouth every 6 (six) hours as needed.       Diagnostic Studies: No results found.  Disposition: Discharge disposition: 01-Home or Self Care  Discharge Instructions    Call MD for:  persistant nausea and vomiting   Complete by:  As directed    Call MD for:  redness, tenderness, or signs of infection (pain, swelling, redness, odor or green/yellow discharge around incision site)   Complete by:  As directed    Call MD for:  severe uncontrolled pain   Complete by:  As directed    Call MD for:  temperature >100.4   Complete by:  As directed    Diet - low sodium heart healthy   Complete by:  As directed    Driving Restrictions   Complete by:  As directed    None while taking narcotic pain meds   Increase activity slowly   Complete by:  As directed    Lifting restrictions    Complete by:  As directed    Nothing > 20 lbs x 6 wks   Sexual Activity Restrictions   Complete by:  As directed    None x 6 wks      Follow-up [facility] [place] at [facility] In 2 weeks.   Specialty:  Obstetrics and Gynecology Why:  postop check, they will call you with an appointment Contact information: [place] [phone]           Signed: [name] 04/13/2018, 7:26 AM

## 2018-04-14 ENCOUNTER — Other Ambulatory Visit: Payer: Self-pay

## 2018-04-14 ENCOUNTER — Observation Stay (HOSPITAL_COMMUNITY)
Admission: AD | Admit: 2018-04-14 | Discharge: 2018-04-15 | Disposition: A | Discharge status: Home / Self Care | Payer: Medicaid Other | Attending: Family Medicine | Admitting: Family Medicine

## 2018-04-14 ENCOUNTER — Encounter (HOSPITAL_COMMUNITY): Payer: Self-pay

## 2018-04-14 ENCOUNTER — Inpatient Hospital Stay (HOSPITAL_COMMUNITY): Payer: Medicaid Other

## 2018-04-14 DIAGNOSIS — N99820 Postprocedural hemorrhage and hematoma of a genitourinary system organ or structure following a genitourinary system procedure: Secondary | ICD-10-CM | POA: Diagnosis not present

## 2018-04-14 DIAGNOSIS — E669 Obesity, unspecified: Secondary | ICD-10-CM | POA: Diagnosis not present

## 2018-04-14 DIAGNOSIS — R109 Unspecified abdominal pain: Secondary | ICD-10-CM | POA: Diagnosis present

## 2018-04-14 DIAGNOSIS — I10 Essential (primary) hypertension: Secondary | ICD-10-CM | POA: Insufficient documentation

## 2018-04-14 DIAGNOSIS — Z9889 Other specified postprocedural states: Secondary | ICD-10-CM

## 2018-04-14 DIAGNOSIS — Z90710 Acquired absence of both cervix and uterus: Secondary | ICD-10-CM | POA: Insufficient documentation

## 2018-04-14 DIAGNOSIS — S3692XA Contusion of unspecified intra-abdominal organ, initial encounter: Secondary | ICD-10-CM | POA: Diagnosis not present

## 2018-04-14 DIAGNOSIS — D62 Acute posthemorrhagic anemia: Secondary | ICD-10-CM

## 2018-04-14 DIAGNOSIS — Z6835 Body mass index (BMI) 35.0-35.9, adult: Secondary | ICD-10-CM | POA: Diagnosis not present

## 2018-04-14 DIAGNOSIS — R58 Hemorrhage, not elsewhere classified: Secondary | ICD-10-CM | POA: Diagnosis not present

## 2018-04-14 DIAGNOSIS — T148XXA Other injury of unspecified body region, initial encounter: Secondary | ICD-10-CM | POA: Diagnosis not present

## 2018-04-14 LAB — CBC WITH DIFFERENTIAL/PLATELET
Basophils Absolute: 0 10*3/uL (ref 0.0–0.1)
Basophils Relative: 0 %
EOS PCT: 0 %
Eosinophils Absolute: 0 10*3/uL (ref 0.0–0.5)
HCT: 21.4 % — ABNORMAL LOW (ref 36.0–46.0)
Hemoglobin: 7.2 g/dL — ABNORMAL LOW (ref 12.0–15.0)
Lymphocytes Relative: 15 %
Lymphs Abs: 1.1 10*3/uL (ref 0.7–4.0)
MCH: 31.6 pg (ref 26.0–34.0)
MCHC: 33.6 g/dL (ref 30.0–36.0)
MCV: 93.9 fL (ref 80.0–100.0)
MONO ABS: 0.2 10*3/uL (ref 0.1–1.0)
Monocytes Relative: 3 %
Neutro Abs: 5.7 10*3/uL (ref 1.7–7.7)
Neutrophils Relative %: 82 %
PLATELETS: 176 10*3/uL (ref 150–400)
RBC: 2.28 MIL/uL — ABNORMAL LOW (ref 3.87–5.11)
RDW: 12.9 % (ref 11.5–15.5)
WBC: 7 10*3/uL (ref 4.0–10.5)
nRBC: 0 % (ref 0.0–0.2)

## 2018-04-14 LAB — URINALYSIS, ROUTINE W REFLEX MICROSCOPIC
Bacteria, UA: NONE SEEN
Bilirubin Urine: NEGATIVE
GLUCOSE, UA: NEGATIVE mg/dL
Ketones, ur: NEGATIVE mg/dL
Leukocytes, UA: NEGATIVE
Nitrite: NEGATIVE
Protein, ur: NEGATIVE mg/dL
Specific Gravity, Urine: 1.017 (ref 1.005–1.030)
pH: 8 (ref 5.0–8.0)

## 2018-04-14 LAB — PREPARE RBC (CROSSMATCH)

## 2018-04-14 LAB — COMPREHENSIVE METABOLIC PANEL
ALT: 12 U/L (ref 0–44)
AST: 16 U/L (ref 15–41)
Albumin: 4.1 g/dL (ref 3.5–5.0)
Alkaline Phosphatase: 43 U/L (ref 38–126)
Anion gap: 7 (ref 5–15)
BUN: 15 mg/dL (ref 6–20)
CO2: 27 mmol/L (ref 22–32)
Calcium: 8.4 mg/dL — ABNORMAL LOW (ref 8.9–10.3)
Chloride: 103 mmol/L (ref 98–111)
Creatinine, Ser: 0.84 mg/dL (ref 0.44–1.00)
GFR calc Af Amer: >60 mL/min (ref >60)
GFR calc non Af Amer: >60 mL/min (ref >60)
Glucose, Bld: 95 mg/dL (ref 70–99)
Potassium: 3.7 mmol/L (ref 3.5–5.1)
Sodium: 137 mmol/L (ref 135–145)
Total Bilirubin: 0.8 mg/dL (ref 0.3–1.2)
Total Protein: 6.3 g/dL — ABNORMAL LOW (ref 6.5–8.1)

## 2018-04-14 LAB — D-DIMER, QUANTITATIVE (NOT AT ARMC): D DIMER QUANT: 5.23 ug{FEU}/mL — AB (ref 0.00–0.50)

## 2018-04-14 MED ORDER — HYDROMORPHONE HCL 1 MG/ML IJ SOLN
0.2000 mg | INTRAMUSCULAR | Status: DC | PRN
  Administered 2018-04-15: 0.6 mg via INTRAVENOUS
  Filled 2018-04-14: qty 1

## 2018-04-14 MED ORDER — SODIUM CHLORIDE 0.9% IV SOLUTION
Freq: Once | INTRAVENOUS | Status: AC
  Administered 2018-04-14: 23:00:00 via INTRAVENOUS

## 2018-04-14 MED ORDER — TRAMADOL HCL 50 MG PO TABS
50.0000 mg | ORAL_TABLET | Freq: Once | ORAL | Status: AC
  Administered 2018-04-14: 50 mg via ORAL
  Filled 2018-04-14: qty 1

## 2018-04-14 MED ORDER — ALUM & MAG HYDROXIDE-SIMETH 200-200-20 MG/5ML PO SUSP: 30.0000 mL | ORAL | Status: DC | PRN

## 2018-04-14 MED ORDER — LACTATED RINGERS IV SOLN
INTRAVENOUS | Status: DC
  Administered 2018-04-15 (×2): via INTRAVENOUS

## 2018-04-14 MED ORDER — DIPHENHYDRAMINE HCL 25 MG PO CAPS
25.0000 mg | ORAL_CAPSULE | Freq: Once | ORAL | Status: AC
  Administered 2018-04-14: 25 mg via ORAL
  Filled 2018-04-14: qty 1

## 2018-04-14 MED ORDER — OXYCODONE-ACETAMINOPHEN 5-325 MG PO TABS: 1.0000 | ORAL_TABLET | ORAL | Status: DC | PRN

## 2018-04-14 MED ORDER — IOPAMIDOL (ISOVUE-300) INJECTION 61%
30.0000 mL | Freq: Once | INTRAVENOUS | Status: AC | PRN
  Administered 2018-04-14: 30 mL via ORAL

## 2018-04-14 MED ORDER — GUAIFENESIN 100 MG/5ML PO SOLN
15.0000 mL | ORAL | Status: DC | PRN
  Filled 2018-04-14: qty 15

## 2018-04-14 MED ORDER — ACETAMINOPHEN 325 MG PO TABS
650.0000 mg | ORAL_TABLET | Freq: Once | ORAL | Status: AC
  Administered 2018-04-14: 650 mg via ORAL
  Filled 2018-04-14: qty 2

## 2018-04-14 MED ORDER — LACTATED RINGERS IV SOLN
INTRAVENOUS | Status: DC
  Administered 2018-04-14 – 2018-04-15 (×3): via INTRAVENOUS

## 2018-04-14 MED ORDER — SODIUM CHLORIDE 0.9 % IV SOLN
8.0000 mg | Freq: Once | INTRAVENOUS | Status: AC
  Administered 2018-04-14: 8 mg via INTRAVENOUS
  Filled 2018-04-14: qty 4

## 2018-04-14 MED ORDER — IOPAMIDOL (ISOVUE-370) INJECTION 76%
100.0000 mL | Freq: Once | INTRAVENOUS | Status: AC | PRN
  Administered 2018-04-14: 100 mL via INTRAVENOUS

## 2018-04-14 MED ORDER — METOCLOPRAMIDE HCL 5 MG/ML IJ SOLN
10.0000 mg | Freq: Four times a day (QID) | INTRAMUSCULAR | Status: DC | PRN
  Administered 2018-04-15: 10 mg via INTRAVENOUS
  Filled 2018-04-14: qty 2

## 2018-04-14 MED ORDER — MENTHOL 3 MG MT LOZG: 1.0000 | LOZENGE | OROMUCOSAL | Status: DC | PRN

## 2018-04-14 MED ORDER — IBUPROFEN 600 MG PO TABS
600.0000 mg | ORAL_TABLET | Freq: Four times a day (QID) | ORAL | Status: DC | PRN
  Administered 2018-04-14: 600 mg via ORAL
  Filled 2018-04-14: qty 1

## 2018-04-14 NOTE — MAU Provider Note (Addendum)
History     CSN: [id number]  Arrival date and time: 04/14/18 1156   First Provider Initiated Contact with Patient 04/14/18 1447      Chief Complaint  Patient presents with  . Abdominal Pain  . Dizziness   HPI Pt is a 33 y/o non-pregnant female presenting today s/p hysterectomy (on 04/12/2018) with 2 days of abdominal pain, chills, subjective fever, and shortness of breath. She reports feeling nauseated, but that nausea has improved since arrival today. She notes spotting since surgery, and has passed one small clot. She has passed flatus, but has not yet had a BM.   She denies chest pain and calf tenderness.   Past Medical History:  Diagnosis Date  . Headache(784.0)    OTC med prn  . Obesity   . Pregnancy induced hypertension 2009, 2017   resolved after pregnancy  . SVD (spontaneous vaginal delivery)    x 4    Past Surgical History:  Procedure Laterality Date  . TONSILLECTOMY  2005  . TUBAL LIGATION  01/01/2012   Procedure: POST PARTUM TUBAL LIGATION;  Surgeon: Lavonia Drafts, MD;  Location: [facility];  Service: Gynecology;  Laterality: Bilateral;  with filshie clips  . VAGINAL HYSTERECTOMY Right 04/12/2018   Procedure: HYSTERECTOMY VAGINAL WITH RIGHT SALPINGECTOMY;  Surgeon: [name], MD;  Location: [facility];  Service: Gynecology;  Laterality: Right;  . WISDOM TOOTH EXTRACTION      Family History  Problem Relation Age of Onset  . Arthritis Mother   . Thyroid disease Maternal Grandmother   . Lupus Maternal Grandmother     Social History   Tobacco Use  . Smoking status: Never Smoker  . Smokeless tobacco: Never Used  Substance Use Topics  . Alcohol use: No  . Drug use: No    Allergies: No Known Allergies  Medications Prior to Admission  Medication Sig Dispense Refill Last Dose  . acetaminophen (TYLENOL) 500 MG tablet Take 1,000 mg by mouth every 6 (six) hours as needed for moderate pain or headache.   04/11/2018 at 1630  . oxyCODONE-acetaminophen  (PERCOCET/ROXICET) 5-325 MG tablet Take 1-2 tablets by mouth every 6 (six) hours as needed. 30 tablet 0     Review of Systems  Constitutional: Positive for chills and fever (feeling warm\).  Respiratory: Positive for shortness of breath.   Cardiovascular: Negative for chest pain.  Gastrointestinal: Positive for abdominal pain, constipation (has not passed stool since surgery) and nausea.  Genitourinary: Positive for vaginal bleeding. Negative for hematuria.  Neurological: Positive for headaches (headache has improved since arrival).   Physical Exam   Blood pressure 109/65, pulse (!) 101, temperature 99.1 F (37.3 C), temperature source Oral, resp. rate 18, last menstrual period 03/14/2018, SpO2 93 %.  Physical Exam  Constitutional: She appears well-developed and well-nourished.  Appears fatigued   Cardiovascular: Normal rate, regular rhythm and normal heart sounds.  No murmur heard. Respiratory: Breath sounds normal. She has no wheezes.  GI: Soft. There is abdominal tenderness.  Bowel sounds faint  Musculoskeletal:        General: No tenderness (no chords palpable) or edema.  Neurological: She is alert.  Skin: Skin is warm and dry.  Psychiatric: She has a normal mood and affect. Her behavior is normal.    MAU Course  Procedures  MDM Labs --D dimer: elevated at 5.23 --Urinalysils: no signs of ketones, adequate urine output --CBC: Hemoglobin decreased at 7.2, HCT 21.4, RBC: 2.28 --CMP: AST and ALT not elevated  Imaging: --CT angio  chest PE: r/o PE --CT angio pelvis: evaluate for possible hematoma  NOTE @ 1819: Headache now improved from 9/10 to 6/10 NOTE @ 1934: O2 sat has improved to 97% at this time  Assessment and Plan   --Nausea:      -Zofran 8mg  IV, 1 dose --Headache:      - Tylenol 650mg  PO, 1 dose      - Tramadol 50 mg PO, 1 dose  Rosalyn Gess 04/14/2018, 4:25 PM   I confirm that I have verified the information documented in the PA student's note  and that I have also personally reperformed the physical exam and all medical decision making activities. Maye Hides  -Dr. Roselie Awkward consutled at [phone] regarding patient's Hgb and presentation, imaging studies ordered to rule out PE and hematoma. While in MAU, patient received tramadol and tylenol, HA now a 6. At 1930, patient was sitting up in bed and husband reported that patient sp02 was improving with deeper breaths and repositioning. She was encouraged to use the incentive spirometer during her stay. Patient vomited her liquid contrast at 1740 but did not throw up crackers at 1530.  -Plan to admit for blood and lap surgery in the morning.  -Patient care endorsed to Dr. Kennon Rounds at 2000.   Curt Bears

## 2018-04-14 NOTE — H&P (Signed)
Tina Holder is an 33 y.o. [phone] female.   Chief Complaint: abdominal pain HPI: Comes back after hysterectomy on 2 days ago with abdominal pain, light headed, nausea and shortness of breath. W/u reveals intra-abdominal hematoma and dropping Hgb.  Past Medical History:  Diagnosis Date  . Headache(784.0)    OTC med prn  . Obesity   . Pregnancy induced hypertension 2009, 2017   resolved after pregnancy  . SVD (spontaneous vaginal delivery)    x 4    Past Surgical History:  Procedure Laterality Date  . TONSILLECTOMY  2005  . TUBAL LIGATION  01/01/2012   Procedure: POST PARTUM TUBAL LIGATION;  Surgeon: Lavonia Drafts, MD;  Location: [facility];  Service: Gynecology;  Laterality: Bilateral;  with filshie clips  . VAGINAL HYSTERECTOMY Right 04/12/2018   Procedure: HYSTERECTOMY VAGINAL WITH RIGHT SALPINGECTOMY;  Surgeon: [name], MD;  Location: [facility];  Service: Gynecology;  Laterality: Right;  . WISDOM TOOTH EXTRACTION      Family History  Problem Relation Age of Onset  . Arthritis Mother   . Thyroid disease Maternal Grandmother   . Lupus Maternal Grandmother    Social History:  reports that she has never smoked. She has never used smokeless tobacco. She reports that she does not drink alcohol or use drugs.  Allergies: No Known Allergies  Medications Prior to Admission  Medication Sig Dispense Refill  . acetaminophen (TYLENOL) 500 MG tablet Take 1,000 mg by mouth every 6 (six) hours as needed for moderate pain or headache.    . oxyCODONE-acetaminophen (PERCOCET/ROXICET) 5-325 MG tablet Take 1-2 tablets by mouth every 6 (six) hours as needed. 30 tablet 0    A comprehensive review of systems was negative.  Blood pressure (!) 108/59, pulse 95, temperature 98.3 F (36.8 C), temperature source Oral, resp. rate 18, last menstrual period 03/14/2018, SpO2 92 %. General appearance: alert, cooperative and appears stated age Head: Normocephalic, without obvious abnormality,  atraumatic Neck: supple, symmetrical, trachea midline Lungs: normal effort Heart: regular rate and rhythm Abdomen: soft, tender, diminished bowel sounds. Extremities: Homans sign is negative, no sign of DVT Skin: Skin color, texture, turgor normal. No rashes or lesions Neurologic: Grossly normal   Lab Results  Component Value Date   WBC 7.0 04/14/2018   HGB 7.2 (L) 04/14/2018   HCT 21.4 (L) 04/14/2018   MCV 93.9 04/14/2018   PLT 176 04/14/2018   Lab Results  Component Value Date   PREGTESTUR NEGATIVE 04/12/2018     Assessment/Plan Acute blood loss anemia  Intra abdominal hemorrhage  For transfusion of 2 u and am laparoscopic evacuation of hematoma  Risks include but are not limited to bleeding, infection, injury to surrounding structures, including bowel, bladder and ureters, blood clots, and death.  Likelihood of success is high.    [name] 04/14/2018, 8:34 PM

## 2018-04-14 NOTE — MAU Note (Addendum)
Pt presents to MAU with c/o of abdominal pain x 2 days, Pt had vaginal hysterectomy on 04/12/2018. She has felt warm to touch, has had headache/dizziness, and has been short of breath today. Pt has had small about of spotting since surgery.

## 2018-04-14 NOTE — Plan of Care (Signed)
  Problem: Education: Goal: Knowledge of General Education information will improve Description Including pain rating scale, medication(s)/side effects and non-pharmacologic comfort measures Outcome: Progressing   Problem: Health Behavior/Discharge Planning: Goal: Ability to manage health-related needs will improve Outcome: Progressing   Problem: Clinical Measurements: Goal: Ability to maintain clinical measurements within normal limits will improve Outcome: Progressing   Problem: Clinical Measurements: Goal: Will remain free from infection Outcome: Progressing   Problem: Clinical Measurements: Goal: Respiratory complications will improve Outcome: Progressing   Problem: Activity: Goal: Risk for activity intolerance will decrease Outcome: Progressing

## 2018-04-14 NOTE — MAU Note (Signed)
Urine in lab 

## 2018-04-15 ENCOUNTER — Encounter (HOSPITAL_COMMUNITY)
Admission: AD | Disposition: A | Discharge status: Home / Self Care | Payer: Self-pay | Source: Home / Self Care | Attending: Family Medicine

## 2018-04-15 ENCOUNTER — Inpatient Hospital Stay (HOSPITAL_COMMUNITY): Payer: Medicaid Other | Admitting: Anesthesiology

## 2018-04-15 ENCOUNTER — Encounter (HOSPITAL_COMMUNITY): Payer: Self-pay | Admitting: Anesthesiology

## 2018-04-15 DIAGNOSIS — T148XXA Other injury of unspecified body region, initial encounter: Secondary | ICD-10-CM

## 2018-04-15 DIAGNOSIS — S3692XA Contusion of unspecified intra-abdominal organ, initial encounter: Secondary | ICD-10-CM | POA: Diagnosis not present

## 2018-04-15 DIAGNOSIS — I10 Essential (primary) hypertension: Secondary | ICD-10-CM | POA: Diagnosis not present

## 2018-04-15 DIAGNOSIS — R58 Hemorrhage, not elsewhere classified: Secondary | ICD-10-CM

## 2018-04-15 DIAGNOSIS — N99820 Postprocedural hemorrhage and hematoma of a genitourinary system organ or structure following a genitourinary system procedure: Secondary | ICD-10-CM | POA: Diagnosis not present

## 2018-04-15 DIAGNOSIS — E669 Obesity, unspecified: Secondary | ICD-10-CM | POA: Diagnosis not present

## 2018-04-15 DIAGNOSIS — D62 Acute posthemorrhagic anemia: Secondary | ICD-10-CM

## 2018-04-15 HISTORY — PX: REPAIR VAGINAL CUFF: SHX6067

## 2018-04-15 HISTORY — PX: LAPAROSCOPY: SHX197

## 2018-04-15 LAB — CBC
HCT: 27.2 % — ABNORMAL LOW (ref 36.0–46.0)
HCT: 27.3 % — ABNORMAL LOW (ref 36.0–46.0)
Hemoglobin: 8.9 g/dL — ABNORMAL LOW (ref 12.0–15.0)
Hemoglobin: 9.2 g/dL — ABNORMAL LOW (ref 12.0–15.0)
MCH: 30 pg (ref 26.0–34.0)
MCH: 30.8 pg (ref 26.0–34.0)
MCHC: 32.7 g/dL (ref 30.0–36.0)
MCHC: 33.7 g/dL (ref 30.0–36.0)
MCV: 91.6 fL (ref 80.0–100.0)
MCV: 91.3 fL (ref 80.0–100.0)
NRBC: 0.4 % — AB (ref 0.0–0.2)
PLATELETS: 156 10*3/uL (ref 150–400)
Platelets: 181 10*3/uL (ref 150–400)
RBC: 2.97 MIL/uL — ABNORMAL LOW (ref 3.87–5.11)
RBC: 2.99 MIL/uL — AB (ref 3.87–5.11)
RDW: 14.7 % (ref 11.5–15.5)
RDW: 14.5 % (ref 11.5–15.5)
WBC: 6.2 10*3/uL (ref 4.0–10.5)
WBC: 6.9 10*3/uL (ref 4.0–10.5)
nRBC: 0.4 % — ABNORMAL HIGH (ref 0.0–0.2)

## 2018-04-15 SURGERY — LAPAROSCOPY OPERATIVE
Anesthesia: General

## 2018-04-15 MED ORDER — BUPIVACAINE HCL (PF) 0.25 % IJ SOLN
INTRAMUSCULAR | Status: DC | PRN
  Administered 2018-04-15: 4 mL
  Administered 2018-04-15: 10 mL
  Administered 2018-04-15: 2 mL

## 2018-04-15 MED ORDER — SUGAMMADEX SODIUM 200 MG/2ML IV SOLN
INTRAVENOUS | Status: DC | PRN
  Administered 2018-04-15: 200 mg via INTRAVENOUS

## 2018-04-15 MED ORDER — HYDROMORPHONE HCL 1 MG/ML IJ SOLN
INTRAMUSCULAR | Status: AC
  Filled 2018-04-15: qty 0.5

## 2018-04-15 MED ORDER — BUPIVACAINE HCL (PF) 0.25 % IJ SOLN
INTRAMUSCULAR | Status: AC
  Filled 2018-04-15: qty 30

## 2018-04-15 MED ORDER — MEPERIDINE HCL 25 MG/ML IJ SOLN: 6.2500 mg | INTRAMUSCULAR | Status: DC | PRN

## 2018-04-15 MED ORDER — HYDROMORPHONE HCL 1 MG/ML IJ SOLN
0.2500 mg | INTRAMUSCULAR | Status: DC | PRN
  Administered 2018-04-15: 0.25 mg via INTRAVENOUS

## 2018-04-15 MED ORDER — FENTANYL CITRATE (PF) 250 MCG/5ML IJ SOLN
INTRAMUSCULAR | Status: AC
  Filled 2018-04-15: qty 5

## 2018-04-15 MED ORDER — SUGAMMADEX SODIUM 200 MG/2ML IV SOLN
INTRAVENOUS | Status: AC
  Filled 2018-04-15: qty 2

## 2018-04-15 MED ORDER — METOCLOPRAMIDE HCL 5 MG/ML IJ SOLN: 10.0000 mg | Freq: Once | INTRAMUSCULAR | Status: DC | PRN

## 2018-04-15 MED ORDER — ONDANSETRON HCL 4 MG/2ML IJ SOLN
INTRAMUSCULAR | Status: AC
  Filled 2018-04-15: qty 2

## 2018-04-15 MED ORDER — PROPOFOL 10 MG/ML IV BOLUS
INTRAVENOUS | Status: DC | PRN
  Administered 2018-04-15: 140 mg via INTRAVENOUS

## 2018-04-15 MED ORDER — ESTRADIOL 0.1 MG/GM VA CREA
TOPICAL_CREAM | VAGINAL | Status: AC
  Filled 2018-04-15: qty 42.5

## 2018-04-15 MED ORDER — ACETAMINOPHEN-CODEINE #3 300-30 MG PO TABS: 1.0000 | ORAL_TABLET | ORAL | 0 refills | Status: DC | PRN

## 2018-04-15 MED ORDER — KETOROLAC TROMETHAMINE 30 MG/ML IJ SOLN
INTRAMUSCULAR | Status: DC | PRN
  Administered 2018-04-15: 30 mg via INTRAVENOUS

## 2018-04-15 MED ORDER — FENTANYL CITRATE (PF) 250 MCG/5ML IJ SOLN
INTRAMUSCULAR | Status: DC | PRN
  Administered 2018-04-15: 150 ug via INTRAVENOUS
  Administered 2018-04-15 (×2): 50 ug via INTRAVENOUS

## 2018-04-15 MED ORDER — GABAPENTIN 100 MG PO CAPS
100.0000 mg | ORAL_CAPSULE | Freq: Three times a day (TID) | ORAL | Status: DC
  Administered 2018-04-15: 100 mg via ORAL
  Filled 2018-04-15 (×3): qty 1

## 2018-04-15 MED ORDER — PROMETHAZINE HCL 25 MG PO TABS: 25.0000 mg | ORAL_TABLET | Freq: Four times a day (QID) | ORAL | 2 refills | Status: DC | PRN

## 2018-04-15 MED ORDER — ONDANSETRON HCL 4 MG/2ML IJ SOLN
INTRAMUSCULAR | Status: DC | PRN
  Administered 2018-04-15: 4 mg via INTRAVENOUS

## 2018-04-15 MED ORDER — MIDAZOLAM HCL 2 MG/2ML IJ SOLN
INTRAMUSCULAR | Status: AC
  Filled 2018-04-15: qty 2

## 2018-04-15 MED ORDER — MIDAZOLAM HCL 5 MG/5ML IJ SOLN
INTRAMUSCULAR | Status: DC | PRN
  Administered 2018-04-15: 2 mg via INTRAVENOUS

## 2018-04-15 MED ORDER — DEXAMETHASONE SODIUM PHOSPHATE 10 MG/ML IJ SOLN
INTRAMUSCULAR | Status: AC
  Filled 2018-04-15: qty 1

## 2018-04-15 MED ORDER — PROPOFOL 10 MG/ML IV BOLUS
INTRAVENOUS | Status: AC
  Filled 2018-04-15: qty 20

## 2018-04-15 MED ORDER — LIDOCAINE HCL (CARDIAC) PF 100 MG/5ML IV SOSY
PREFILLED_SYRINGE | INTRAVENOUS | Status: DC | PRN
  Administered 2018-04-15: 60 mg via INTRAVENOUS

## 2018-04-15 MED ORDER — KETOROLAC TROMETHAMINE 30 MG/ML IJ SOLN
INTRAMUSCULAR | Status: AC
  Filled 2018-04-15: qty 1

## 2018-04-15 MED ORDER — LIDOCAINE HCL (CARDIAC) PF 100 MG/5ML IV SOSY
PREFILLED_SYRINGE | INTRAVENOUS | Status: AC
  Filled 2018-04-15: qty 5

## 2018-04-15 MED ORDER — DEXAMETHASONE SODIUM PHOSPHATE 10 MG/ML IJ SOLN
INTRAMUSCULAR | Status: DC | PRN
  Administered 2018-04-15: 10 mg via INTRAVENOUS

## 2018-04-15 MED ORDER — ROCURONIUM BROMIDE 100 MG/10ML IV SOLN
INTRAVENOUS | Status: DC | PRN
  Administered 2018-04-15: 40 mg via INTRAVENOUS
  Administered 2018-04-15: 10 mg via INTRAVENOUS

## 2018-04-15 SURGICAL SUPPLY — 35 items
APPLICATOR ARISTA FLEXITIP XL (MISCELLANEOUS) ×2 IMPLANT
BLADE SURG 15 STRL LF C SS BP (BLADE) ×1 IMPLANT
BLADE SURG 15 STRL SS (BLADE) ×1
COVER TABLE BACK 60X90 (DRAPES) ×1 IMPLANT
DERMABOND ADHESIVE PROPEN (GAUZE/BANDAGES/DRESSINGS) ×1
DERMABOND ADVANCED .7 DNX6 (GAUZE/BANDAGES/DRESSINGS) IMPLANT
DRSG OPSITE 4X5.5 SM (GAUZE/BANDAGES/DRESSINGS) ×1 IMPLANT
DRSG OPSITE POSTOP 3X4 (GAUZE/BANDAGES/DRESSINGS) IMPLANT
DURAPREP 26ML APPLICATOR (WOUND CARE) ×2 IMPLANT
GAUZE SPONGE 4X4 16PLY XRAY LF (GAUZE/BANDAGES/DRESSINGS) ×1 IMPLANT
GLOVE BIOGEL PI IND STRL 7.0 (GLOVE) ×4 IMPLANT
GLOVE BIOGEL PI INDICATOR 7.0 (GLOVE) ×4
GLOVE ECLIPSE 7.0 STRL STRAW (GLOVE) ×2 IMPLANT
GOWN STRL REUS W/TWL LRG LVL3 (GOWN DISPOSABLE) ×6 IMPLANT
HEMOSTAT ARISTA ABSORB 3G PWDR (HEMOSTASIS) ×2 IMPLANT
HIBICLENS CHG 4% 4OZ BTL (MISCELLANEOUS) ×2 IMPLANT
PACK LAPAROSCOPY BASIN (CUSTOM PROCEDURE TRAY) ×2 IMPLANT
PACK TRENDGUARD 450 HYBRID PRO (MISCELLANEOUS) IMPLANT
POUCH SPECIMEN RETRIEVAL 10MM (ENDOMECHANICALS) IMPLANT
PROTECTOR NERVE ULNAR (MISCELLANEOUS) ×4 IMPLANT
SET IRRIG TUBING LAPAROSCOPIC (IRRIGATION / IRRIGATOR) ×1 IMPLANT
SHEARS HARMONIC ACE PLUS 36CM (ENDOMECHANICALS) IMPLANT
SLEEVE XCEL OPT CAN 5 100 (ENDOMECHANICALS) ×1 IMPLANT
SUT VIC AB 3-0 X1 27 (SUTURE) ×2 IMPLANT
SUT VICRYL 0 CT 1 36IN (SUTURE) ×1 IMPLANT
SUT VICRYL 0 UR6 27IN ABS (SUTURE) ×4 IMPLANT
TOWEL OR 17X24 6PK STRL BLUE (TOWEL DISPOSABLE) ×4 IMPLANT
TRAY FOLEY W/BAG SLVR 14FR (SET/KITS/TRAYS/PACK) ×2 IMPLANT
TRENDGUARD 450 HYBRID PRO PACK (MISCELLANEOUS) ×2
TROCAR BALLN 12MMX100 BLUNT (TROCAR) ×2 IMPLANT
TROCAR XCEL NON-BLD 5MMX100MML (ENDOMECHANICALS) ×2 IMPLANT
TUBING INSUF HEATED (TUBING) ×2 IMPLANT
TUBING SUCTION BULK 100 FT (MISCELLANEOUS) ×1 IMPLANT
WARMER LAPAROSCOPE (MISCELLANEOUS) ×2 IMPLANT
YANKAUER SUCT BULB TIP NO VENT (SUCTIONS) ×1 IMPLANT

## 2018-04-15 NOTE — Anesthesia Procedure Notes (Signed)
Procedure Name: Intubation Date/Time: 04/15/2018 9:45 AM Performed by: Ignacia Bayley, CRNA Pre-anesthesia Checklist: Patient identified, Patient being monitored, Timeout performed, Emergency Drugs available and Suction available Patient Re-evaluated:Patient Re-evaluated prior to induction Oxygen Delivery Method: Circle System Utilized Preoxygenation: Pre-oxygenation with 100% oxygen Induction Type: IV induction Ventilation: Mask ventilation without difficulty Laryngoscope Size: Miller and 2 Grade View: Grade II Tube type: Oral Tube size: 7.0 mm Number of attempts: 1 Airway Equipment and Method: stylet Placement Confirmation: ETT inserted through vocal cords under direct vision,  positive ETCO2 and breath sounds checked- equal and bilateral Secured at: 21 cm Tube secured with: Tape Dental Injury: Teeth and Oropharynx as per pre-operative assessment

## 2018-04-15 NOTE — Discharge Instructions (Signed)
Blood Transfusion, Adult °A blood transfusion is a procedure in which you are given blood through an IV tube. You may need this procedure because of: °· Illness. °· Surgery. °· Injury. °The blood may come from someone else (a donor). You may also be able to donate blood for yourself (autologous blood donation). The blood given in a transfusion is made up of different types of cells. You may get: °· Red blood cells. These carry oxygen to the cells in the body. °· White blood cells. These help you fight infections. °· Platelets. These help your blood to clot. °· Plasma. This is the liquid part of your blood. It helps with fluid imbalances. °If you have a clotting disorder, you may also get other types of blood products. °What happens before the procedure? °· You will have a blood test to find out your blood type. The test also finds out what type of blood your body will accept and matches it to the donor type. °· If you are going to have a planned surgery, you may be able to donate your own blood. This may be done in case you need a transfusion. °· If you have had an allergic reaction to a transfusion in the past, you may be given medicine to help prevent a reaction. This medicine may be given to you by mouth or through an IV. °· You will have your temperature, blood pressure, and pulse checked. °· Follow instructions from your doctor about what you cannot eat or drink. °· Ask your doctor about: °? Changing or stopping your regular medicines. This is important if you take diabetes medicines or blood thinners. °? Taking medicines such as aspirin and ibuprofen. These medicines can thin your blood. Do not take these medicines before your procedure if your doctor tells you not to. °What happens during the procedure? °· An IV tube will be put into one of your veins. °· The bag of donated blood will be attached to your IV tube. Then, the blood will enter through your vein. °· Your temperature, blood pressure, and pulse will  be checked regularly during the procedure. This is done to find early signs of a transfusion reaction. °· If you have any signs or symptoms of a reaction, your transfusion will be stopped. You may also be given medicine. °· When the transfusion is done, your IV tube will be taken out. °· Pressure may be applied to the IV site for a few minutes. °· A bandage (dressing) will be put on the IV site. °The procedure may vary among doctors and hospitals. °What happens after the procedure? °· Your temperature, blood pressure, heart rate, breathing rate, and blood oxygen level will be checked often. °· Your blood may be tested to see how you are responding to the transfusion. °· You may be warmed with fluids or blankets. This is done to keep the temperature of your body normal. °Summary °· A blood transfusion is a procedure in which you are given blood through an IV tube. °· The blood may come from someone else (a donor). You may also be able to donate blood for yourself. °· If you have had an allergic reaction to a transfusion in the past, you may be given medicine to help prevent a reaction. This medicine may be given to you by mouth or through an IV tube. °· Your temperature, blood pressure, heart rate, breathing rate, and blood oxygen level will be checked often. °· Your blood may be tested to see   how you are responding to the transfusion. This information is not intended to replace advice given to you by your health care provider. Make sure you discuss any questions you have with your health care provider. Document Released: 06/12/2008 Document Revised: 11/08/2015 Document Reviewed: 11/08/2015 Elsevier Interactive Patient Education  2019 [place].  Vaginal Hysterectomy, Care After Refer to this sheet in the next few weeks. These instructions provide you with information about caring for yourself after your procedure. Your health care provider may also give you more specific instructions. Your treatment has  been planned according to current medical practices, but problems sometimes occur. Call your health care provider if you have any problems or questions after your procedure. What can I expect after the procedure? After the procedure, it is common to have:  Pain.  Soreness and numbness in your incision areas.  Vaginal bleeding and discharge.  Constipation.  Temporary problems emptying the bladder.  Feelings of sadness or other emotions. Follow these instructions at home: Medicines  Take over-the-counter and prescription medicines only as told by your health care provider.  If you were prescribed an antibiotic medicine, take it as told by your health care provider. Do not stop taking the antibiotic even if you start to feel better.  Do not drive or operate heavy machinery while taking prescription pain medicine. Activity  Return to your normal activities as told by your health care provider. Ask your health care provider what activities are safe for you.  Get regular exercise as told by your health care provider. You may be told to take short walks every day and go farther each time.  Do not lift anything that is heavier than 10 lb (4.5 kg). General instructions   Do not put anything in your vagina for 6 weeks after your surgery or as told by your health care provider. This includes tampons and douches.  Do not have sex until your health care provider says you can.  Do not take baths, swim, or use a hot tub until your health care provider approves.  Drink enough fluid to keep your urine clear or pale yellow.  Do not drive for 24 hours if you were given a sedative.  Keep all follow-up visits as told by your health care provider. This is important. Contact a health care provider if:  Your pain medicine is not helping.  You have a fever.  You have redness, swelling, or pain at your incision site.  You have blood, pus, or a bad-smelling discharge from your vagina.  You  continue to have difficulty urinating. Get help right away if:  You have severe abdominal or back pain.  You have heavy bleeding from your vagina.  You have chest pain or shortness of breath. This information is not intended to replace advice given to you by your health care provider. Make sure you discuss any questions you have with your health care provider. Document Released: 07/08/2015 Document Revised: 08/22/2015 Document Reviewed: 03/31/2015 Elsevier Interactive Patient Education  2019 [employer].

## 2018-04-15 NOTE — Interval H&P Note (Signed)
History and Physical Interval Note:  04/15/2018 9:35 AM  Tina Holder  has presented today for surgery, with the diagnosis of s/p hysterectomy on 04/12/2018, blood is present in abd  The various methods of treatment have been discussed with the patient and family. After consideration of risks, benefits and other options for treatment, the patient has consented to  Procedure(s): LAPAROSCOPY OPERATIVE (N/A) as a surgical intervention. She is now s/p 2 u PRBC's still feels better but having pain. Plan is to evacuate the hematoma. The patient's history has been reviewed, patient examined, no change in status, stable for surgery.  I have reviewed the patient's chart and labs.  Questions were answered to the patient's satisfaction.     Tina Holder

## 2018-04-15 NOTE — Anesthesia Postprocedure Evaluation (Signed)
Anesthesia Post Note  Patient: Julieth Tugman  Procedure(s) Performed: DIAGNOSTIC LAPAROSCOPY WITH EVACUATION OF HEMATOMA (N/A ) REPAIR VAGINAL CUFF (N/A )     Patient location during evaluation: PACU Anesthesia Type: General Level of consciousness: awake and alert and oriented Pain management: pain level controlled Vital Signs Assessment: post-procedure vital signs reviewed and stable Respiratory status: spontaneous breathing, nonlabored ventilation and respiratory function stable Cardiovascular status: blood pressure returned to baseline and stable Postop Assessment: no apparent nausea or vomiting Anesthetic complications: no    Last Vitals:  Vitals:   04/15/18 1215 04/15/18 1231  BP: 109/69 (!) 108/58  Pulse: 86 (P) 88  Resp: 12 18  Temp:  37.6 C  SpO2: 94% 97%    Last Pain:  Vitals:   04/15/18 1231  TempSrc: Oral  PainSc:    Pain Goal: Patients Stated Pain Goal: 5 (04/15/18 1130)              @[email or web address] (12500)  )Saydee Zolman A.

## 2018-04-15 NOTE — Op Note (Signed)
Preoperative diagnosis: Pelvic pain   Postoperative diagnosis: Same  Procedure: Diagnostic laparoscopy, evacuation of hematoma, repair of vaginal cuff  Surgeon: Darron Doom, MD  Anesthesia: GETT   Findings: hematoma, noted. Oozing from the cuff  Estimated blood loss: 389 cc  Complications: None known  Specimens: None  Disposition of Specimen:  Pathology  Reason for procedure: 33 y.o. [id number] with h/o TVH done 3 days prior. Returned with significant drop in Hgb found to have hematoma. Given 2 u PRBC's, and taken to OR for evacuation.  Procedure: Patient was taken to the operating room was placed in dorsal lithotomy in [place]. She was prepped and draped in the usual sterile fashion. A timeout was performed. SCDs were in place. Foley catheter is used to drain bladder. Speculum was placed inside the vagina. A sponge was placed vaginally. Attention was then turned to the abdomen. Four cc of 0.25% Marcaine was injected at the umbilicus. Two Allis clamps were used to tent up the skin of the umbilicus a vertical one half centimeter incision was made here. The fascia was incised with the knife  And the peritoneum was entered sharply with this incision. Two edges of the fascia were tagged with a 0 Vicryl suture on a UR 6 needle. A Hassan trocar was placed through this incision and a pneumoperitoneum was created. The patient was then placed in Trendelenburg. The pelvis was inspected in a systematic fashion. The patient's left tube and both ovaries appeared normal. The posterior and anterior cul-de-sacs were inspected. A LLQ 5 mm port placed under direct visualization with Nezjhat suction used to remove blood and clots adherent to cuff and pedicles. Oozing noted from cuff. Following removal of blood cuff noted to be oozing. Attempt to stop bleeding with Arista, but oozing continued. Decision made to go below and add suture to cuff. Cuff was not taken down and running locked closure performed of  cuff with 0 Vicryl while patient in steep Trendelenberg. Once cuff was closed, returned to abdomen and re-inspected. No further oozing noted. Arista re-applied.  The appendix was inspected and felt to be normal. The upper abdomen was inspected the liver edge gallbladder and stomach appeared normal. More clots evacuated and removed. There were no additional findings in the pelvis and so the procedure was terminated. All instrument, needle  and lap counts were correct x 2. The patient was awakened to recovery in stable condition.  Standley Dakins PrattMD 04/15/2018 11:22 AM

## 2018-04-15 NOTE — Progress Notes (Signed)
Teaching complete  Out in wheelchair

## 2018-04-15 NOTE — Discharge Summary (Signed)
Physician Discharge Summary  Patient ID: Tina Holder MRN: [id number] DOB/AGE: 34-Oct-1987 34 y.o.  Admit date: 04/14/2018 Discharge date:   Admission Diagnoses:  Active Problems:   Hematoma   Acute blood loss anemia   Intra abdominal hemorrhage   Discharge Diagnoses:  Same  Past Medical History:  Diagnosis Date  . Headache(784.0)    OTC med prn  . Obesity   . Pregnancy induced hypertension 2009, 2017   resolved after pregnancy  . SVD (spontaneous vaginal delivery)    x 4    Surgeries: Procedure(s): DIAGNOSTIC LAPAROSCOPY WITH EVACUATION OF HEMATOMA REPAIR VAGINAL CUFF on 04/15/2018   Consultants: None  Discharged Condition: Improved  Hospital Course: Tina Holder is an 33 y.o. female [id number] who was admitted 04/14/2018 with a chief complaint of  Chief Complaint  Patient presents with  . Abdominal Pain  . Dizziness  , and found to have a diagnosis of intrabdominal hemorrhage following surgery. Also with acute blood loss anemia with HGB of 7.2. CT findings confirmed diagnosis and she was admitted overnight for blood transfusion and she was brought to the operating room on 04/15/2018 and underwent the above named procedures.    She was given sequential compression devices, early ambulation for DVT prophylaxis.  Postoperatively, her hgb was stable at 6 hours, her diet was advanced, she was voiding, had stable vital signs and felt much improved. She was deemed stable for discharge.  Recent vital signs:  Vitals:   04/15/18 1555 04/15/18 1733  BP: (!) 100/57   Pulse: 80   Resp: 15   Temp: 99.4 F (37.4 C)   SpO2: 98% 98%   Discharge exam: Physical Examination: General appearance - alert, well appearing, and in no distress Chest - normal effort Heart - normal rate and regular rhythm Abdomen - soft, appropriately tender, dressing is clean and dry Extremities - peripheral pulses normal, no pedal edema, no clubbing or cyanosis, Homan's sign negative  bilaterally Recent laboratory studies:  Results for orders placed or performed during the hospital encounter of 04/14/18  Urinalysis, Routine w reflex microscopic  Result Value Ref Range   Color, Urine YELLOW YELLOW   APPearance CLEAR CLEAR   Specific Gravity, Urine 1.017 1.005 - 1.030   pH 8.0 5.0 - 8.0   Glucose, UA NEGATIVE NEGATIVE mg/dL   Hgb urine dipstick SMALL (A) NEGATIVE   Bilirubin Urine NEGATIVE NEGATIVE   Ketones, ur NEGATIVE NEGATIVE mg/dL   Protein, ur NEGATIVE NEGATIVE mg/dL   Nitrite NEGATIVE NEGATIVE   Leukocytes, UA NEGATIVE NEGATIVE   RBC / HPF 11-20 0 - 5 RBC/hpf   WBC, UA 0-5 0 - 5 WBC/hpf   Bacteria, UA NONE SEEN NONE SEEN   Squamous Epithelial / LPF 0-5 0 - 5   Mucus PRESENT   CBC with Differential/Platelet  Result Value Ref Range   WBC 7.0 4.0 - 10.5 K/uL   RBC 2.28 (L) 3.87 - 5.11 MIL/uL   Hemoglobin 7.2 (L) 12.0 - 15.0 g/dL   HCT 21.4 (L) 36.0 - 46.0 %   MCV 93.9 80.0 - 100.0 fL   MCH 31.6 26.0 - 34.0 pg   MCHC 33.6 30.0 - 36.0 g/dL   RDW 12.9 11.5 - 15.5 %   Platelets 176 150 - 400 K/uL   nRBC 0.0 0.0 - 0.2 %   Neutrophils Relative % 82 %   Neutro Abs 5.7 1.7 - 7.7 K/uL   Lymphocytes Relative 15 %   Lymphs Abs 1.1 0.7 - 4.0 K/uL  Monocytes Relative 3 %   Monocytes Absolute 0.2 0.1 - 1.0 K/uL   Eosinophils Relative 0 %   Eosinophils Absolute 0.0 0.0 - 0.5 K/uL   Basophils Relative 0 %   Basophils Absolute 0.0 0.0 - 0.1 K/uL  Comprehensive metabolic panel  Result Value Ref Range   Sodium 137 135 - 145 mmol/L   Potassium 3.7 3.5 - 5.1 mmol/L   Chloride 103 98 - 111 mmol/L   CO2 27 22 - 32 mmol/L   Glucose, Bld 95 70 - 99 mg/dL   BUN 15 6 - 20 mg/dL   Creatinine, Ser 0.84 0.44 - 1.00 mg/dL   Calcium 8.4 (L) 8.9 - 10.3 mg/dL   Total Protein 6.3 (L) 6.5 - 8.1 g/dL   Albumin 4.1 3.5 - 5.0 g/dL   AST 16 15 - 41 U/L   ALT 12 0 - 44 U/L   Alkaline Phosphatase 43 38 - 126 U/L   Total Bilirubin 0.8 0.3 - 1.2 mg/dL   GFR calc non Af Amer >60  >60 mL/min   GFR calc Af Amer >60 >60 mL/min   Anion gap 7 5 - 15  D-dimer, quantitative (not at [facility])  Result Value Ref Range   D-Dimer, Quant 5.23 (H) 0.00 - 0.50 ug/mL-FEU  CBC  Result Value Ref Range   WBC 6.2 4.0 - 10.5 K/uL   RBC 2.97 (L) 3.87 - 5.11 MIL/uL   Hemoglobin 8.9 (L) 12.0 - 15.0 g/dL   HCT 27.2 (L) 36.0 - 46.0 %   MCV 91.6 80.0 - 100.0 fL   MCH 30.0 26.0 - 34.0 pg   MCHC 32.7 30.0 - 36.0 g/dL   RDW 14.7 11.5 - 15.5 %   Platelets 156 150 - 400 K/uL   nRBC 0.4 (H) 0.0 - 0.2 %  CBC  Result Value Ref Range   WBC 6.9 4.0 - 10.5 K/uL   RBC 2.99 (L) 3.87 - 5.11 MIL/uL   Hemoglobin 9.2 (L) 12.0 - 15.0 g/dL   HCT 27.3 (L) 36.0 - 46.0 %   MCV 91.3 80.0 - 100.0 fL   MCH 30.8 26.0 - 34.0 pg   MCHC 33.7 30.0 - 36.0 g/dL   RDW 14.5 11.5 - 15.5 %   Platelets 181 150 - 400 K/uL   nRBC 0.4 (H) 0.0 - 0.2 %  Type and screen [place]  Result Value Ref Range   ABO/RH(D) O POS    Antibody Screen NEG    Sample Expiration 04/17/2018    Unit Number [id number]    Blood Component Type RED CELLS,LR    Unit division 00    Status of Unit ISSUED    Transfusion Status OK TO TRANSFUSE    Crossmatch Result Compatible    Unit Number [id number]    Blood Component Type RED CELLS,LR    Unit division 00    Status of Unit ISSUED,FINAL    Transfusion Status OK TO TRANSFUSE    Crossmatch Result      Compatible Performed at [facility], [address], [place], [place] [address]   Prepare RBC  Result Value Ref Range   Order Confirmation      ORDER PROCESSED BY BLOOD BANK Performed at [facility], [address], [place], [place] [address]   BPAM [facility]  Result Value Ref Range   ISSUE DATE / TIME 401027253664    Blood Product Unit Number [id number]    PRODUCT CODE [id number]    Unit Type and  Rh 5100    Blood Product Expiration Date 798921194174    ISSUE DATE / TIME [id number]    Blood Product Unit Number [id number]    PRODUCT CODE  E0336V00    Unit Type and Rh 0277    Blood Product Expiration Date [id number]     Discharge Medications:   Allergies as of 04/15/2018   No Known Allergies     Medication List    STOP taking these medications   oxyCODONE-acetaminophen 5-325 MG tablet Commonly known as:  PERCOCET/ROXICET     TAKE these medications   acetaminophen 500 MG tablet Commonly known as:  TYLENOL Take 1,000 mg by mouth every 6 (six) hours as needed for moderate pain or headache.   acetaminophen-codeine 300-30 MG tablet Commonly known as:  TYLENOL #3 Take 1-2 tablets by mouth every 4 (four) hours as needed for moderate pain.   promethazine 25 MG tablet Commonly known as:  PHENERGAN Take 1 tablet (25 mg total) by mouth every 6 (six) hours as needed for nausea.       Diagnostic Studies: Ct Angio Chest Pe W And/or Wo Contrast  Result Date: 04/14/2018 CLINICAL DATA:  Abdominal pain for 2 days, warm to touch, headache, dizziness, shortness of breath today, add vaginal hysterectomy on 04/12/2018 EXAM: CT ANGIOGRAPHY CHEST CT ABDOMEN AND PELVIS WITH CONTRAST TECHNIQUE: Multidetector CT imaging of the chest was performed using the standard protocol during bolus administration of intravenous contrast. Multiplanar CT image reconstructions and MIPs were obtained to evaluate the vascular anatomy. Multidetector CT imaging of the abdomen and pelvis was performed using the standard protocol during bolus administration of intravenous contrast. CONTRAST:  152mL ISOVUE-370 IOPAMIDOL (ISOVUE-370) INJECTION 76% IV, 30mL ISOVUE-300 IOPAMIDOL (ISOVUE-300) INJECTION 61% PO COMPARISON:  None. FINDINGS: CTA CHEST FINDINGS Cardiovascular: Aorta normal caliber without aneurysm or dissection. Pulmonary arteries well opacified and patent. No evidence of pulmonary embolism. Heart unremarkable. No pericardial effusion. Mediastinum/Nodes: Esophagus unremarkable. Base of cervical region normal appearance. No thoracic adenopathy.  Lungs/Pleura: Scattered at subsegmental atelectasis in BILATERAL lower lobes. Lungs otherwise clear. No infiltrate or pneumothorax. Minimal RIGHT pleural effusion. Musculoskeletal: Unremarkable Review of the MIP images confirms the above findings. CT ABDOMEN and PELVIS FINDINGS Hepatobiliary: Gallbladder and liver normal appearance Pancreas: Normal appearance Spleen: Normal appearance Adrenals/Urinary Tract: Adrenal glands, kidneys, ureters, and bladder normal appearance Stomach/Bowel: Normal appendix. Stomach and bowel loops normal appearance. Vascular/Lymphatic: Aorta normal caliber.  No adenopathy. Reproductive: Uterus surgically absent. Questionable visualization of ovaries, see below Other: Intermediate attenuation fluid is seen in the pelvis, pericolic gutters, perihepatic, and perisplenic compatible with hemoperitoneum. This is likely obscuring the ovaries in the pelvis. No free air. No hernia. Musculoskeletal: Osseous structures unremarkable. Review of the MIP images confirms the above findings. IMPRESSION: No evidence of pulmonary embolism. Scattered subsegmental atelectasis in BILATERAL lower lobes. Intermediate attenuation fluid in the pelvis, pericolic gutters, perihepatic and perisplenic compatible with hemoperitoneum. Critical Value/emergent results were called by telephone at the time of interpretation on 04/14/2018 at 7:57 pm to Dr. Kennon Rounds, who verbally acknowledged these results. Electronically Signed   By: Lavonia Dana M.D.   On: 04/14/2018 20:18   Ct Abdomen Pelvis W Contrast  Result Date: 04/14/2018 CLINICAL DATA:  Abdominal pain for 2 days, warm to touch, headache, dizziness, shortness of breath today, add vaginal hysterectomy on 04/12/2018 EXAM: CT ANGIOGRAPHY CHEST CT ABDOMEN AND PELVIS WITH CONTRAST TECHNIQUE: Multidetector CT imaging of the chest was performed using the standard protocol during bolus administration of intravenous contrast. Multiplanar CT image  reconstructions and MIPs were  obtained to evaluate the vascular anatomy. Multidetector CT imaging of the abdomen and pelvis was performed using the standard protocol during bolus administration of intravenous contrast. CONTRAST:  183mL ISOVUE-370 IOPAMIDOL (ISOVUE-370) INJECTION 76% IV, 70mL ISOVUE-300 IOPAMIDOL (ISOVUE-300) INJECTION 61% PO COMPARISON:  None. FINDINGS: CTA CHEST FINDINGS Cardiovascular: Aorta normal caliber without aneurysm or dissection. Pulmonary arteries well opacified and patent. No evidence of pulmonary embolism. Heart unremarkable. No pericardial effusion. Mediastinum/Nodes: Esophagus unremarkable. Base of cervical region normal appearance. No thoracic adenopathy. Lungs/Pleura: Scattered at subsegmental atelectasis in BILATERAL lower lobes. Lungs otherwise clear. No infiltrate or pneumothorax. Minimal RIGHT pleural effusion. Musculoskeletal: Unremarkable Review of the MIP images confirms the above findings. CT ABDOMEN and PELVIS FINDINGS Hepatobiliary: Gallbladder and liver normal appearance Pancreas: Normal appearance Spleen: Normal appearance Adrenals/Urinary Tract: Adrenal glands, kidneys, ureters, and bladder normal appearance Stomach/Bowel: Normal appendix. Stomach and bowel loops normal appearance. Vascular/Lymphatic: Aorta normal caliber.  No adenopathy. Reproductive: Uterus surgically absent. Questionable visualization of ovaries, see below Other: Intermediate attenuation fluid is seen in the pelvis, pericolic gutters, perihepatic, and perisplenic compatible with hemoperitoneum. This is likely obscuring the ovaries in the pelvis. No free air. No hernia. Musculoskeletal: Osseous structures unremarkable. Review of the MIP images confirms the above findings. IMPRESSION: No evidence of pulmonary embolism. Scattered subsegmental atelectasis in BILATERAL lower lobes. Intermediate attenuation fluid in the pelvis, pericolic gutters, perihepatic and perisplenic compatible with hemoperitoneum. Critical Value/emergent  results were called by telephone at the time of interpretation on 04/14/2018 at 7:57 pm to Dr. Kennon Rounds, who verbally acknowledged these results. Electronically Signed   By: Lavonia Dana M.D.   On: 04/14/2018 20:18    Disposition: Discharge disposition: 01-Home or Self Care       Discharge Instructions     Remove dressing in 72 hours   Complete by:  As directed    Call MD for:  persistant nausea and vomiting   Complete by:  As directed    Call MD for:  redness, tenderness, or signs of infection (pain, swelling, redness, odor or green/yellow discharge around incision site)   Complete by:  As directed    Call MD for:  severe uncontrolled pain   Complete by:  As directed    Call MD for:  temperature >100.4   Complete by:  As directed    Diet - low sodium heart healthy   Complete by:  As directed    Driving Restrictions   Complete by:  As directed    None while taking narcotic pain meds   Increase activity slowly   Complete by:  As directed    Lifting restrictions   Complete by:  As directed    Nothing > 20 lbs x 6 wks   Other Restrictions   Complete by:  As directed    No baths, showers only x 6 wks   Sexual Activity Restrictions   Complete by:  As directed    None x 6 wks      Follow-up [place] for [employer] at [facility].   Specialty:  Obstetrics and Gynecology Why:  keep next scheduled appointment Contact information: [address] [place] [phone]           Signed: [name] 04/15/2018, 6:04 PM

## 2018-04-15 NOTE — Transfer of Care (Signed)
Immediate Anesthesia Transfer of Care Note  Patient: Tina Holder  Procedure(s) Performed: DIAGNOSTIC LAPAROSCOPY WITH EVACUATION OF HEMATOMA (N/A ) REPAIR VAGINAL CUFF (N/A )  Patient Location: PACU  Anesthesia Type:General  Level of Consciousness: sedated  Airway & Oxygen Therapy: Patient Spontanous Breathing and Patient connected to nasal cannula oxygen  Post-op Assessment: Report given to RN and Post -op Vital signs reviewed and stable  Post vital signs: stable  Last Vitals:  Vitals Value Taken Time  BP 112/72 04/15/2018 11:31 AM  Temp    Pulse 99 04/15/2018 11:36 AM  Resp 14 04/15/2018 11:36 AM  SpO2 94 % 04/15/2018 11:36 AM  Vitals shown include unvalidated device data.  Last Pain:  Vitals:   04/15/18 0800  TempSrc:   PainSc: 0-No pain      Patients Stated Pain Goal: 3 (83/16/74 2552)  Complications: No apparent anesthesia complications

## 2018-04-15 NOTE — Anesthesia Preprocedure Evaluation (Signed)
Anesthesia Evaluation  Patient identified by MRN, date of birth, ID band Patient awake    Reviewed: Allergy & Precautions, NPO status , Patient's Chart, lab work & pertinent test results  Airway Mallampati: II  TM Distance: >3 FB Neck ROM: Full    Dental no notable dental hx. (+) Teeth Intact   Pulmonary asthma ,    Pulmonary exam normal breath sounds clear to auscultation       Cardiovascular hypertension, Normal cardiovascular exam Rhythm:Regular Rate:Normal     Neuro/Psych  Headaches, negative psych ROS   GI/Hepatic Neg liver ROS, GERD  ,  Endo/Other  Obesity  Renal/GU negative Renal ROS  negative genitourinary   Musculoskeletal negative musculoskeletal ROS (+)   Abdominal (+) + obese,   Peds  Hematology  (+) anemia ,   Anesthesia Other Findings   Reproductive/Obstetrics Hematoperitoneum/Hematoma S/P vaginal hysterectomy 3 days ago                             Anesthesia Physical Anesthesia Plan  ASA: II  Anesthesia Plan: General   Post-op Pain Management:    Induction: Intravenous  PONV Risk Score and Plan: 4 or greater and Scopolamine patch - Pre-op, Ondansetron, Dexamethasone and Midazolam  Airway Management Planned: Oral ETT  Additional Equipment:   Intra-op Plan:   Post-operative Plan: Extubation in OR  Informed Consent: I have reviewed the patients History and Physical, chart, labs and discussed the procedure including the risks, benefits and alternatives for the proposed anesthesia with the patient or authorized representative who has indicated his/her understanding and acceptance.     Dental advisory given  Plan Discussed with: CRNA and Surgeon  Anesthesia Plan Comments:         Anesthesia Quick Evaluation

## 2018-04-15 NOTE — Plan of Care (Signed)
  Problem: Clinical Measurements: Goal: Diagnostic test results will improve Outcome: Progressing   Problem: Education: Goal: Knowledge of General Education information will improve Description Including pain rating scale, medication(s)/side effects and non-pharmacologic comfort measures Outcome: Completed/Met

## 2018-04-16 ENCOUNTER — Encounter (HOSPITAL_COMMUNITY): Payer: Self-pay | Admitting: Family Medicine

## 2018-04-16 LAB — TYPE AND SCREEN
ABO/RH(D): O POS
ANTIBODY SCREEN: NEGATIVE
Unit division: 0
Unit division: 0

## 2018-04-16 LAB — BPAM RBC
BLOOD PRODUCT EXPIRATION DATE: 202002102359
Blood Product Expiration Date: 202002102359
ISSUE DATE / TIME: 202001162250
ISSUE DATE / TIME: 202001170152
UNIT TYPE AND RH: 5100
Unit Type and Rh: 5100

## 2018-05-02 ENCOUNTER — Ambulatory Visit (INDEPENDENT_AMBULATORY_CARE_PROVIDER_SITE_OTHER): Payer: Medicaid Other | Admitting: Family Medicine

## 2018-05-02 ENCOUNTER — Encounter: Payer: Self-pay | Admitting: Family Medicine

## 2018-05-02 VITALS — BP 108/72 | HR 72 | Wt 189.2 lb

## 2018-05-02 DIAGNOSIS — Z09 Encounter for follow-up examination after completed treatment for conditions other than malignant neoplasm: Secondary | ICD-10-CM

## 2018-05-02 DIAGNOSIS — D62 Acute posthemorrhagic anemia: Secondary | ICD-10-CM

## 2018-05-02 LAB — CBC
HEMOGLOBIN: 13.2 g/dL (ref 11.1–15.9)
Hematocrit: 40 % (ref 34.0–46.6)
MCH: 29.9 pg (ref 26.6–33.0)
MCHC: 33 g/dL (ref 31.5–35.7)
MCV: 91 fL (ref 79–97)
Platelets: 274 10*3/uL (ref 150–450)
RBC: 4.41 x10E6/uL (ref 3.77–5.28)
RDW: 13.7 % (ref 11.7–15.4)
WBC: 4 10*3/uL (ref 3.4–10.8)

## 2018-05-02 NOTE — Progress Notes (Signed)
    Subjective:    Patient ID: Tina Holder is a 33 y.o. female presenting with Routine Post Op  on 05/02/2018  HPI: Here following TVH and right salpingectomy. Then underwent blood transfusion and laparoscopy and evacuation of hematoma and over-sewing of vaginal cuff due to ABL anemia following surgery. She is feeling better now.  Review of Systems  Constitutional: Positive for fatigue. Negative for chills and fever.  Respiratory: Negative for shortness of breath.   Cardiovascular: Negative for chest pain.  Gastrointestinal: Negative for abdominal pain, nausea and vomiting.  Genitourinary: Negative for dysuria.  Skin: Negative for rash.      Objective:    BP 108/72   Pulse 72   Wt 189 lb 3.2 oz (85.8 kg)   LMP 03/14/2018 (Exact Date)   BMI 33.52 kg/m  Physical Exam Constitutional:      General: She is not in acute distress.    Appearance: She is well-developed.  HENT:     Head: Normocephalic and atraumatic.  Eyes:     General: No scleral icterus. Neck:     Musculoskeletal: Neck supple.  Cardiovascular:     Rate and Rhythm: Normal rate.  Pulmonary:     Effort: Pulmonary effort is normal.  Abdominal:     Palpations: Abdomen is soft.  Skin:    General: Skin is warm and dry.     Comments: Incisions are healing well.   Neurological:     Mental Status: She is alert and oriented to person, place, and time.         Assessment & Plan:   Problem List Items Addressed This Visit      Unprioritized   Acute blood loss anemia - Primary   Relevant Orders   CBC    Other Visit Diagnoses    Postop check       routine care.      Return in about 4 weeks (around 05/30/2018) for postop check.  [name] 05/02/2018 5:20 PM

## 2018-05-02 NOTE — Progress Notes (Signed)
Patient reports still bleeding.

## 2018-05-25 ENCOUNTER — Encounter: Payer: Self-pay | Admitting: Family Medicine

## 2018-05-25 ENCOUNTER — Ambulatory Visit (INDEPENDENT_AMBULATORY_CARE_PROVIDER_SITE_OTHER): Payer: Self-pay | Admitting: Family Medicine

## 2018-05-25 VITALS — BP 125/87 | HR 69

## 2018-05-25 DIAGNOSIS — Z5189 Encounter for other specified aftercare: Secondary | ICD-10-CM

## 2018-05-25 DIAGNOSIS — T148XXA Other injury of unspecified body region, initial encounter: Secondary | ICD-10-CM

## 2018-05-25 DIAGNOSIS — L24A9 Irritant contact dermatitis due friction or contact with other specified body fluids: Secondary | ICD-10-CM

## 2018-05-25 NOTE — Progress Notes (Signed)
Pt having some drainage and redness in her belly button from her surgery.

## 2018-05-25 NOTE — Progress Notes (Signed)
   GYNECOLOGY PROBLEM  VISIT ENCOUNTER NOTE  Subjective:   Tina Holder is a 33 y.o. [phone] female here for a a problem GYN visit.  Current complaints: drainage from belly button, yellowish, worse in the AM. Reports general soreness around umbilicus. No abdominal pain or fevers.   Denies abnormal vaginal bleeding, discharge, pelvic pain, problems with intercourse or other gynecologic concerns.    Gynecologic History Patient's last menstrual period was 03/14/2018 (exact date). Contraception: status post hysterectomy  Health Maintenance Due  Topic Date Due  . INFLUENZA VACCINE  10/28/2017     The following portions of the patient's history were reviewed and updated as appropriate: allergies, current medications, past family history, past medical history, past social history, past surgical history and problem list.  Review of Systems Pertinent items are noted in HPI.   Objective:  BP 125/87   Pulse 69   LMP 03/14/2018 (Exact Date)  Gen: well appearing, NAD HEENT: no scleral icterus CV: RR Lung: Normal WOB Ext: warm well perfused Abd: NTND. Soft to palpation, no rebound.  Umbilicus: small ~0EB scar granuloma preset superiorly. No cellulitis of surrounding skin. Applied silver nitrate.   Assessment and Plan:  1. Visit for wound check  2. Drainage from wound Drainage is likely for granuloma, no signs of skin infection. Discussed with patient could be reaction to suture. No need for abx at this point.   Recheck in 1 week, given s/sx to return    Please refer to After Visit Summary for other counseling recommendations.   Caren Macadam, MD, MPH, ABFM Attending [place] for [facility]

## 2018-06-02 ENCOUNTER — Ambulatory Visit (INDEPENDENT_AMBULATORY_CARE_PROVIDER_SITE_OTHER): Payer: Medicaid Other | Admitting: Family Medicine

## 2018-06-02 ENCOUNTER — Encounter: Payer: Self-pay | Admitting: Family Medicine

## 2018-06-02 VITALS — BP 127/79 | HR 79 | Wt 193.0 lb

## 2018-06-02 DIAGNOSIS — Z09 Encounter for follow-up examination after completed treatment for conditions other than malignant neoplasm: Secondary | ICD-10-CM

## 2018-06-02 DIAGNOSIS — Z90710 Acquired absence of both cervix and uterus: Secondary | ICD-10-CM

## 2018-06-02 DIAGNOSIS — Z9889 Other specified postprocedural states: Secondary | ICD-10-CM

## 2018-06-02 NOTE — Progress Notes (Signed)
   Subjective:    Patient ID: Tina Holder is a 33 y.o. female presenting with Follow-up  on 06/02/2018  HPI: Here for post op check. umbilicus is now healing well. No further drainage and odor is gone.  Review of Systems  Constitutional: Negative for chills and fever.  Respiratory: Negative for shortness of breath.   Cardiovascular: Negative for chest pain.  Gastrointestinal: Negative for abdominal pain, nausea and vomiting.  Genitourinary: Negative for dysuria.  Skin: Negative for rash.      Objective:    BP 127/79   Pulse 79   Wt 193 lb (87.5 kg)   LMP 03/14/2018 (Exact Date)   BMI 34.19 kg/m  Physical Exam Constitutional:      General: She is not in acute distress.    Appearance: She is well-developed.  HENT:     Head: Normocephalic and atraumatic.  Eyes:     General: No scleral icterus. Neck:     Musculoskeletal: Neck supple.  Cardiovascular:     Rate and Rhythm: Normal rate.  Pulmonary:     Effort: Pulmonary effort is normal.  Abdominal:     Palpations: Abdomen is soft.  Genitourinary:    Comments: Vaginal cuff intact, minimal suture noted. Skin:    General: Skin is warm and dry.     Comments: Umbilicus is without erythema, drainage, healing well.  Neurological:     Mental Status: She is alert and oriented to person, place, and time.         Assessment & Plan:   Problem List Items Addressed This Visit      Unprioritized   Status post hysterectomy - Primary    Doing well.       Other Visit Diagnoses    Postop check           Return if symptoms worsen or fail to improve.  [name] 06/02/2018 10:16 AM

## 2018-06-02 NOTE — Progress Notes (Signed)
Pt states drainage is better

## 2018-06-02 NOTE — Assessment & Plan Note (Signed)
Doing well 

## 2018-11-09 ENCOUNTER — Encounter: Payer: Self-pay | Admitting: Family Medicine

## 2018-11-09 ENCOUNTER — Ambulatory Visit (INDEPENDENT_AMBULATORY_CARE_PROVIDER_SITE_OTHER): Payer: Medicaid Other | Admitting: Family Medicine

## 2018-11-09 ENCOUNTER — Other Ambulatory Visit: Payer: Self-pay

## 2018-11-09 VITALS — BP 115/72 | Wt 198.0 lb

## 2018-11-09 DIAGNOSIS — N898 Other specified noninflammatory disorders of vagina: Secondary | ICD-10-CM

## 2018-11-09 NOTE — Progress Notes (Signed)
   Subjective:    Patient ID: Tina Holder is a 33 y.o. female presenting with ABNORMAL BLEEDING  on 11/09/2018  HPI: Here following vaginal hysterectomy in 01/20 with bleeding after intercourse since surgery. Bleeding/spotting about every other time they have intercourse. No pain.  Review of Systems  Constitutional: Negative for chills and fever.  Respiratory: Negative for shortness of breath.   Cardiovascular: Negative for chest pain.  Gastrointestinal: Negative for abdominal pain, nausea and vomiting.  Genitourinary: Negative for dysuria.  Skin: Negative for rash.      Objective:    BP 115/72   Wt 198 lb (89.8 kg)   LMP 03/14/2018 (Exact Date)   BMI 35.07 kg/m  Physical Exam Constitutional:      General: She is not in acute distress.    Appearance: She is well-developed.  HENT:     Head: Normocephalic and atraumatic.  Eyes:     General: No scleral icterus. Neck:     Musculoskeletal: Neck supple.  Cardiovascular:     Rate and Rhythm: Normal rate.  Pulmonary:     Effort: Pulmonary effort is normal.  Abdominal:     Palpations: Abdomen is soft.  Genitourinary:    Comments: Small amount of granulation tissue at the top of the cuff--treated with AgNO3. Skin:    General: Skin is warm and dry.  Neurological:     Mental Status: She is alert and oriented to person, place, and time.         Assessment & Plan:   Problem List Items Addressed This Visit      Unprioritized   Granulation tissue at vaginal vault - Primary    Treated with AgNO3--return for another treatment in 1 wk.         Total face-to-face time with patient: 10 minutes. Over 50% of encounter was spent on counseling and coordination of care. Return in about 1 week (around 11/16/2018).  [name] 11/09/2018 2:52 PM

## 2018-11-09 NOTE — Assessment & Plan Note (Signed)
Treated with AgNO3--return for another treatment in 1 wk.

## 2018-11-14 ENCOUNTER — Other Ambulatory Visit: Payer: Self-pay

## 2018-11-14 ENCOUNTER — Encounter: Payer: Self-pay | Admitting: Family Medicine

## 2018-11-14 ENCOUNTER — Ambulatory Visit (INDEPENDENT_AMBULATORY_CARE_PROVIDER_SITE_OTHER): Payer: Medicaid Other | Admitting: Family Medicine

## 2018-11-14 VITALS — BP 116/82 | HR 67 | Wt 196.0 lb

## 2018-11-14 DIAGNOSIS — N898 Other specified noninflammatory disorders of vagina: Secondary | ICD-10-CM | POA: Diagnosis not present

## 2018-11-14 NOTE — Assessment & Plan Note (Signed)
much improved. Repeat treatment if needed in 3 wks.

## 2018-11-14 NOTE — Progress Notes (Signed)
   Subjective:    Patient ID: Tangela Dolliver is a 33 y.o. female presenting with Follow-up  on 11/14/2018  HPI: Here today for f/u treatment of granulation tissue at top of cuff following vaginal hysterectomy. Had some d/c following this.  Review of Systems  Constitutional: Negative for chills and fever.  Respiratory: Negative for shortness of breath.   Cardiovascular: Negative for chest pain.  Gastrointestinal: Negative for abdominal pain, nausea and vomiting.  Genitourinary: Positive for vaginal discharge. Negative for dysuria.  Skin: Negative for rash.      Objective:    BP 116/82   Pulse 67   Wt 196 lb (88.9 kg)   LMP 03/14/2018 (Exact Date)   BMI 34.72 kg/m  Physical Exam Constitutional:      General: She is not in acute distress.    Appearance: She is well-developed.  HENT:     Head: Normocephalic and atraumatic.  Eyes:     General: No scleral icterus. Neck:     Musculoskeletal: Neck supple.  Cardiovascular:     Rate and Rhythm: Normal rate.  Pulmonary:     Effort: Pulmonary effort is normal.  Abdominal:     Palpations: Abdomen is soft.  Genitourinary:    Comments: Granulation tissue is much smaller today. Treated again with AgNO3. Skin:    General: Skin is warm and dry.  Neurological:     Mental Status: She is alert and oriented to person, place, and time.         Assessment & Plan:   Problem List Items Addressed This Visit      Unprioritized   Granulation tissue at vaginal vault - Primary    much improved. Repeat treatment if needed in 3 wks.         Total face-to-face time with patient: 10 minutes. Over 50% of encounter was spent on counseling and coordination of care. Return in about 3 weeks (around 12/05/2018) for a follow-up with possible AgNO3 treatment.  [name] 11/14/2018 3:59 PM

## 2018-12-08 ENCOUNTER — Other Ambulatory Visit: Payer: Self-pay

## 2018-12-08 ENCOUNTER — Encounter: Payer: Self-pay | Admitting: Family Medicine

## 2018-12-08 ENCOUNTER — Ambulatory Visit (INDEPENDENT_AMBULATORY_CARE_PROVIDER_SITE_OTHER): Payer: Medicaid Other | Admitting: Family Medicine

## 2018-12-08 DIAGNOSIS — N898 Other specified noninflammatory disorders of vagina: Secondary | ICD-10-CM

## 2018-12-08 NOTE — Progress Notes (Signed)
   Subjective:    Patient ID: Tina Holder is a 33 y.o. female presenting with Follow-up  on 12/08/2018  HPI: Here for re-eval. Had some granulation tissue at top of cuff following hysterectomy. She has been treated twice with silver nitrate. No further bleeding following intercourse.  Review of Systems  Constitutional: Negative for chills and fever.  Respiratory: Negative for shortness of breath.   Cardiovascular: Negative for chest pain.  Gastrointestinal: Negative for abdominal pain, nausea and vomiting.  Genitourinary: Negative for dysuria.  Skin: Negative for rash.      Objective:    BP (!) 141/81   Pulse (!) 103   Ht 5\' 2"  (1.575 m)   Wt 196 lb 6.4 oz (89.1 kg)   LMP 03/14/2018 (Exact Date)   BMI 35.92 kg/m  Physical Exam Constitutional:      General: She is not in acute distress.    Appearance: She is well-developed.  HENT:     Head: Normocephalic and atraumatic.  Eyes:     General: No scleral icterus. Neck:     Musculoskeletal: Neck supple.  Cardiovascular:     Rate and Rhythm: Normal rate.  Pulmonary:     Effort: Pulmonary effort is normal.  Abdominal:     Palpations: Abdomen is soft.  Genitourinary:    General: Normal vulva.     Vagina: Normal.     Comments: Cuff intact, resolution of granulation tissue. Skin:    General: Skin is warm and dry.  Neurological:     Mental Status: She is alert and oriented to person, place, and time.         Assessment & Plan:   Problem List Items Addressed This Visit      Unprioritized   Granulation tissue at vaginal vault    Completely resolved.         Total face-to-face time with patient: 10 minutes. Over 50% of encounter was spent on counseling and coordination of care. Return if symptoms worsen or fail to improve.  Tina Holder 12/08/2018 11:00 AM

## 2018-12-08 NOTE — Assessment & Plan Note (Signed)
Completely resolved. 

## 2018-12-16 ENCOUNTER — Other Ambulatory Visit: Payer: Self-pay

## 2018-12-16 ENCOUNTER — Ambulatory Visit (INDEPENDENT_AMBULATORY_CARE_PROVIDER_SITE_OTHER): Payer: Medicaid Other | Admitting: Physician Assistant

## 2018-12-16 ENCOUNTER — Encounter: Payer: Self-pay | Admitting: Physician Assistant

## 2018-12-16 VITALS — BP 124/84 | HR 72 | Wt 197.0 lb

## 2018-12-16 DIAGNOSIS — G43001 Migraine without aura, not intractable, with status migrainosus: Secondary | ICD-10-CM | POA: Diagnosis not present

## 2018-12-16 DIAGNOSIS — M62838 Other muscle spasm: Secondary | ICD-10-CM

## 2018-12-16 MED ORDER — BUTALBITAL-APAP-CAFFEINE 50-325-40 MG PO CAPS: 1.0000 | ORAL_CAPSULE | Freq: Four times a day (QID) | ORAL | 3 refills | Status: DC | PRN

## 2018-12-16 MED ORDER — CYCLOBENZAPRINE HCL 10 MG PO TABS: 10.0000 mg | ORAL_TABLET | Freq: Three times a day (TID) | ORAL | 3 refills | Status: DC | PRN

## 2018-12-16 MED ORDER — EMGALITY 120 MG/ML ~~LOC~~ SOAJ: SUBCUTANEOUS | 11 refills | Status: DC

## 2018-12-16 MED ORDER — METOCLOPRAMIDE HCL 10 MG PO TABS: 10.0000 mg | ORAL_TABLET | Freq: Four times a day (QID) | ORAL | 2 refills | Status: DC | PRN

## 2018-12-16 NOTE — Progress Notes (Signed)
History:  Tina Holder is a 33 y.o. [phone] who presents to clinic today for migraine followup.  She was last seen by this provider in 2017 during her pregnancy.  She requests injections today. She has had ongoing migraine x 4 days and nothing makes it better.  Had hysterectomy in January and is still having hormonal headache.  She uses Fioricet which works completely, half the time.   She uses 100mg  amitriptyline at night for migraine prevention and sleep.  She increased to this dose 2 months ago.  She sleeps better on this.    Topamax caused side effects.  Amitriptyline insufficient improvement Magnesium insufficient improvement. Imitrex and maxalt have not been helpful for acute migraine treatment.    HIT6:66 Number of days in the last 4 weeks with:  Severe headache: 4 Moderate headache: 8 Mild headache: 4  No headache: 10   Past Medical History:  Diagnosis Date  . Headache(784.0)    OTC med prn  . Obesity   . Pregnancy induced hypertension 2009, 2017   resolved after pregnancy  . SVD (spontaneous vaginal delivery)    x 4    Social History   Socioeconomic History  . Marital status: Married    Spouse name: Not on file  . Number of children: 4  . Years of education: Not on file  . Highest education level: Not on file  Occupational History  . Not on file  Social Needs  . Financial resource strain: Not on file  . Food insecurity    Worry: Not on file    Inability: Not on file  . Transportation needs    Medical: Not on file    Non-medical: Not on file  Tobacco Use  . Smoking status: Never Smoker  . Smokeless tobacco: Never Used  Substance and Sexual Activity  . Alcohol use: No  . Drug use: No  . Sexual activity: Not Currently    Birth control/protection: None    Comment: tubal  Lifestyle  . Physical activity    Days per week: Not on file    Minutes per session: Not on file  . Stress: Not on file  Relationships  . Social Herbalist on phone: Not  on file    Gets together: Not on file    Attends religious service: Not on file    Active member of club or organization: Not on file    Attends meetings of clubs or organizations: Not on file    Relationship status: Not on file  . Intimate partner violence    Fear of current or ex partner: Not on file    Emotionally abused: Not on file    Physically abused: Not on file    Forced sexual activity: Not on file  Other Topics Concern  . Not on file  Social History Narrative  . Not on file    Family History  Problem Relation Age of Onset  . Arthritis Mother   . Thyroid disease Maternal Grandmother   . Lupus Maternal Grandmother     No Known Allergies  Current Outpatient Medications on File Prior to Visit  Medication Sig Dispense Refill  . acetaminophen (TYLENOL) 500 MG tablet Take 1,000 mg by mouth every 6 (six) hours as needed for moderate pain or headache.    Marland Kitchen acetaminophen-codeine (TYLENOL #3) 300-30 MG tablet Take 1-2 tablets by mouth every 4 (four) hours as needed for moderate pain. (Patient not taking: Reported on 05/02/2018) 40 tablet 0  .  promethazine (PHENERGAN) 25 MG tablet Take 1 tablet (25 mg total) by mouth every 6 (six) hours as needed for nausea. (Patient not taking: Reported on 05/02/2018) 42 tablet 2   No current facility-administered medications on file prior to visit.      Review of Systems:  All pertinent positive/negative included in HPI, all other review of systems are negative   Objective:  Physical Exam BP 124/84   Pulse 72   Wt 197 lb (89.4 kg)   LMP 03/14/2018 (Exact Date)   BMI 36.03 kg/m  CONSTITUTIONAL: Well-developed, well-nourished female in no acute distress.  EYES: EOM intact ENT: Normocephalic CARDIOVASCULAR: Regular rate   RESPIRATORY: Normal rate.  MUSCULOSKELETAL: Normal ROM,, significant bilat muscle spasm noted SKIN: Warm, dry without erythema  NEUROLOGICAL: Alert, oriented, CN II-XII grossly intact, Appropriate balance PSYCH:  Normal behavior, mood  PROCEDURE:  TRIGGER POINT INJECTIONS  Procedure: Mixture of 1%  Lidocaine, marcaine and dexamethazone in a ratio of 2:2:1  injected with 1cc each site in bilateral greater Occipital Nerves, bilateral lesser occipital nerves, bilateral cervical paraspinal muscles, bilateral trapezius.  Total amount injected: 11cc.  Pt tolerated procedure well.   Assessment & Plan:  Assessment: 1. Migraine without aura and with status migrainosus, not intractable   2. Muscle spasm   Worsening of migraine - status is new   Plan: Trigger Point/Nerve Block today for acute relief of status migraine Emgality for migraine prevention.  Education provided.  Sample provided. Reglan for migraine/nausea.  Do not combine with phenergan. Flexeril for HA or muscle spasm.  Sedation precautions discussed Fioricet for HA if needed.  Massage and Biofreeze encouraged.   Follow-up in 3 months or sooner PRN  Paticia Stack, PA-C 12/16/2018 10:25 AM

## 2018-12-16 NOTE — Patient Instructions (Signed)

## 2019-01-11 ENCOUNTER — Encounter: Payer: Self-pay | Admitting: Radiology

## 2019-02-08 ENCOUNTER — Other Ambulatory Visit: Payer: Self-pay | Admitting: Physician Assistant

## 2019-03-07 ENCOUNTER — Other Ambulatory Visit: Payer: Self-pay | Admitting: Physician Assistant

## 2019-05-12 ENCOUNTER — Other Ambulatory Visit: Payer: Self-pay

## 2019-05-12 ENCOUNTER — Encounter: Payer: Self-pay | Admitting: Physician Assistant

## 2019-05-12 ENCOUNTER — Ambulatory Visit (INDEPENDENT_AMBULATORY_CARE_PROVIDER_SITE_OTHER): Payer: Medicaid Other | Admitting: Physician Assistant

## 2019-05-12 VITALS — BP 111/71 | HR 94 | Wt 206.0 lb

## 2019-05-12 DIAGNOSIS — G43009 Migraine without aura, not intractable, without status migrainosus: Secondary | ICD-10-CM | POA: Diagnosis not present

## 2019-05-12 DIAGNOSIS — M62838 Other muscle spasm: Secondary | ICD-10-CM

## 2019-05-12 MED ORDER — RIZATRIPTAN BENZOATE 10 MG PO TABS: 10.0000 mg | ORAL_TABLET | ORAL | 2 refills | Status: DC | PRN

## 2019-05-12 MED ORDER — ELETRIPTAN HYDROBROMIDE 40 MG PO TABS: 40.0000 mg | ORAL_TABLET | ORAL | 3 refills | Status: DC | PRN

## 2019-05-12 NOTE — Progress Notes (Signed)
History:  Tina Holder is a 34 y.o. [phone] who presents to clinic today for f/u of migraine. Emgality helps the first 2 weeks of each month. Amitriptyline she decreased from 200mg  to 50mg  because she needed to tend to her kids at night.  She doesn't think it made a difference with her headaches to decrease.  Reglan is helpful.  Phenergan is not used as much because she has to be awake to care for her 4 children at home.  Fioricet is no longer helpful.    She requests injections today. They were last done in September 2020 and reportedly very effective.    HIT6:64 Number of days in the last 4 weeks with:  Severe headache: 14 Moderate headache: 6 Mild headache: 4  No headache: 4  Past Medical History:  Diagnosis Date  . Headache(784.0)    OTC med prn  . Obesity   . Pregnancy induced hypertension 2009, 2017   resolved after pregnancy  . SVD (spontaneous vaginal delivery)    x 4    Social History   Socioeconomic History  . Marital status: Married    Spouse name: Not on file  . Number of children: 4  . Years of education: Not on file  . Highest education level: Not on file  Occupational History  . Not on file  Tobacco Use  . Smoking status: Never Smoker  . Smokeless tobacco: Never Used  Substance and Sexual Activity  . Alcohol use: No  . Drug use: No  . Sexual activity: Not Currently    Birth control/protection: None, Surgical    Comment: tubal  Other Topics Concern  . Not on file  Social History Narrative  . Not on file   Social Determinants of Health   Financial Resource Strain:   . Difficulty of Paying Living Expenses: Not on file  Food Insecurity:   . Worried About Charity fundraiser in the Last Year: Not on file  . Ran Out of Food in the Last Year: Not on file  Transportation Needs:   . Lack of Transportation (Medical): Not on file  . Lack of Transportation (Non-Medical): Not on file  Physical Activity:   . Days of Exercise per Week: Not on file  .  Minutes of Exercise per Session: Not on file  Stress:   . Feeling of Stress : Not on file  Social Connections:   . Frequency of Communication with Friends and Family: Not on file  . Frequency of Social Gatherings with Friends and Family: Not on file  . Attends Religious Services: Not on file  . Active Member of Clubs or Organizations: Not on file  . Attends Archivist Meetings: Not on file  . Marital Status: Not on file  Intimate Partner Violence:   . Fear of Current or Ex-Partner: Not on file  . Emotionally Abused: Not on file  . Physically Abused: Not on file  . Sexually Abused: Not on file    Family History  Problem Relation Age of Onset  . Arthritis Mother   . Thyroid disease Maternal Grandmother   . Lupus Maternal Grandmother     No Known Allergies  Current Outpatient Medications on File Prior to Visit  Medication Sig Dispense Refill  . acetaminophen (TYLENOL) 500 MG tablet Take 1,000 mg by mouth every 6 (six) hours as needed for moderate pain or headache.    Marland Kitchen acetaminophen-codeine (TYLENOL #3) 300-30 MG tablet Take 1-2 tablets by mouth every 4 (four) hours as  needed for moderate pain. (Patient not taking: Reported on 05/02/2018) 40 tablet 0  . Butalbital-APAP-Caffeine 50-325-40 MG capsule Take 1-2 capsules by mouth every 6 (six) hours as needed for headache. 30 capsule 3  . cyclobenzaprine (FLEXERIL) 10 MG tablet TAKE ONE TABLET BY MOUTH EVERY 8 HOURS AS NEEDED FOR MUSCLE SPASMS 40 tablet 2  . Galcanezumab-gnlm (EMGALITY) 120 MG/ML SOAJ Inject 240 mg into the skin as directed AND 120 mg every 30 (thirty) days. Inj 240mg  once then 120mg  monthly. 1 pen 11  . metoCLOPramide (REGLAN) 10 MG tablet TAKE ONE TABLET BY MOUTH FOUR TIMES A DAY AS NEEDED FOR NAUSEA AND VOMITING 30 tablet 1  . promethazine (PHENERGAN) 25 MG tablet Take 1 tablet (25 mg total) by mouth every 6 (six) hours as needed for nausea. (Patient not taking: Reported on 05/02/2018) 42 tablet 2   No current  facility-administered medications on file prior to visit.     Review of Systems:  All pertinent positive/negative included in HPI, all other review of systems are negative   Objective:  Physical Exam BP 111/71   Pulse 94   Wt 206 lb (93.4 kg)   LMP 03/14/2018 (Exact Date)   BMI 37.68 kg/m  CONSTITUTIONAL: Well-developed, well-nourished female in no acute distress.  EYES: EOM intact ENT: Normocephalic CARDIOVASCULAR: Regular rate   RESPIRATORY: Normal rate. MUSCULOSKELETAL: Normal ROM, strength equal bilaterally SKIN: Warm, dry without erythema  NEUROLOGICAL: Alert, oriented, CN II-XII grossly intact, Appropriate balance  PSYCH: Normal behavior, mood  PROCEDURE:  TRIGGER POINT INJECTIONS  Procedure: Mixture of 1%  Lidocaine, marcaine and dexamethazone in a ratio of 2:2:1  injected with 1cc each site in bilateral greater Occipital Nerves, bilateral lesser occipital nerves, bilateral cervical paraspinal muscles, bilateral trapezius.  Total amount injected: 15cc.  Pt tolerated procedure well and noted improvement of symptoms prior to leaving the office.    Assessment & Plan:  Assessment: 1. Migraine without aura and without status migrainosus, not intractable   2. Muscle spasm   worsening   Plan: Continue emgality for prevention Pt requesting to trial relpax again, which previously helped.  Per insurance preference, we will trial Maxalt instead at this time.  Pt has used sumatriptan without good results.  Directions given for use.   Other meds refilled as appropriate.  Will limit these injections to 3-4 times per year.  Discussed this with pt.  Follow-up in 51months or sooner PRN  Paticia Stack, PA-C 05/12/2019 10:22 AM

## 2019-05-12 NOTE — Patient Instructions (Signed)

## 2019-05-31 ENCOUNTER — Other Ambulatory Visit: Payer: Self-pay | Admitting: Physician Assistant

## 2019-06-23 ENCOUNTER — Encounter: Payer: Self-pay | Admitting: *Deleted

## 2019-08-18 ENCOUNTER — Encounter: Payer: Medicaid Other | Admitting: Physician Assistant

## 2019-08-18 DIAGNOSIS — R519 Headache, unspecified: Secondary | ICD-10-CM

## 2019-08-21 ENCOUNTER — Other Ambulatory Visit (HOSPITAL_COMMUNITY): Payer: Self-pay | Admitting: Physician Assistant

## 2019-08-21 ENCOUNTER — Other Ambulatory Visit: Payer: Self-pay | Admitting: Physician Assistant

## 2019-08-21 DIAGNOSIS — M545 Low back pain, unspecified: Secondary | ICD-10-CM

## 2019-09-07 ENCOUNTER — Other Ambulatory Visit: Payer: Self-pay | Admitting: Physician Assistant

## 2019-09-08 ENCOUNTER — Other Ambulatory Visit: Payer: Self-pay

## 2019-09-08 ENCOUNTER — Ambulatory Visit (INDEPENDENT_AMBULATORY_CARE_PROVIDER_SITE_OTHER): Payer: Medicaid Other | Admitting: Physician Assistant

## 2019-09-08 ENCOUNTER — Encounter: Payer: Self-pay | Admitting: Physician Assistant

## 2019-09-08 VITALS — BP 129/86 | HR 69

## 2019-09-08 DIAGNOSIS — G43001 Migraine without aura, not intractable, with status migrainosus: Secondary | ICD-10-CM

## 2019-09-08 DIAGNOSIS — M62838 Other muscle spasm: Secondary | ICD-10-CM | POA: Diagnosis not present

## 2019-09-08 MED ORDER — ELETRIPTAN HYDROBROMIDE 40 MG PO TABS: 40.0000 mg | ORAL_TABLET | ORAL | 11 refills | Status: DC | PRN

## 2019-09-08 MED ORDER — CYCLOBENZAPRINE HCL 10 MG PO TABS: ORAL_TABLET | ORAL | 1 refills | Status: DC

## 2019-09-08 MED ORDER — EMGALITY 120 MG/ML ~~LOC~~ SOAJ: SUBCUTANEOUS | 11 refills | Status: DC

## 2019-09-08 NOTE — Progress Notes (Signed)
History:  Tina Holder is a 34 y.o. [phone] who presents to clinic today for headache eval.  She notes the emgality works loner than the prior 2 weeks, but still not the full month.  She does think it makes a difference for the better.  She has been having a let down migraine this week (after family vacation) and has had pain for 3 days straight.  She requests TPI.  She notes continued stress with her 4 children at home, with special needs.   Overall quality of migraine is unchanged.    Past Medical History:  Diagnosis Date  . Headache(784.0)    OTC med prn  . Obesity   . Pregnancy induced hypertension 2009, 2017   resolved after pregnancy  . SVD (spontaneous vaginal delivery)    x 4    Social History   Socioeconomic History  . Marital status: Married    Spouse name: Not on file  . Number of children: 4  . Years of education: Not on file  . Highest education level: Not on file  Occupational History  . Not on file  Tobacco Use  . Smoking status: Never Smoker  . Smokeless tobacco: Never Used  Vaping Use  . Vaping Use: Never used  Substance and Sexual Activity  . Alcohol use: No  . Drug use: No  . Sexual activity: Not Currently    Birth control/protection: None, Surgical    Comment: tubal  Other Topics Concern  . Not on file  Social History Narrative  . Not on file   Social Determinants of Health   Financial Resource Strain:   . Difficulty of Paying Living Expenses:   Food Insecurity:   . Worried About Charity fundraiser in the Last Year:   . Arboriculturist in the Last Year:   Transportation Needs:   . Film/video editor (Medical):   Marland Kitchen Lack of Transportation (Non-Medical):   Physical Activity:   . Days of Exercise per Week:   . Minutes of Exercise per Session:   Stress:   . Feeling of Stress :   Social Connections:   . Frequency of Communication with Friends and Family:   . Frequency of Social Gatherings with Friends and Family:   . Attends Religious  Services:   . Active Member of Clubs or Organizations:   . Attends Archivist Meetings:   Marland Kitchen Marital Status:   Intimate Partner Violence:   . Fear of Current or Ex-Partner:   . Emotionally Abused:   Marland Kitchen Physically Abused:   . Sexually Abused:     Family History  Problem Relation Age of Onset  . Arthritis Mother   . Thyroid disease Maternal Grandmother   . Lupus Maternal Grandmother     No Known Allergies  Current Outpatient Medications on File Prior to Visit  Medication Sig Dispense Refill  . Butalbital-APAP-Caffeine 50-325-40 MG capsule Take 1-2 capsules by mouth every 6 (six) hours as needed for headache. 30 capsule 3  . cyclobenzaprine (FLEXERIL) 10 MG tablet TAKE ONE TABLET BY MOUTH EVERY 8 HOURS AS NEEDED FOR MUSCLE SPASMS 40 tablet 1  . eletriptan (RELPAX) 40 MG tablet Take 1 tablet (40 mg total) by mouth as needed for migraine or headache. May repeat in 2 hours if headache persists or recurs. 10 tablet 3  . Galcanezumab-gnlm (EMGALITY) 120 MG/ML SOAJ Inject 240 mg into the skin as directed AND 120 mg every 30 (thirty) days. Inj 240mg  once then 120mg  monthly.  1 pen 11  . metoCLOPramide (REGLAN) 10 MG tablet TAKE ONE TABLET BY MOUTH FOUR TIMES A DAY AS NEEDED FOR NAUSEA AND VOMITING 30 tablet 1  . acetaminophen (TYLENOL) 500 MG tablet Take 1,000 mg by mouth every 6 (six) hours as needed for moderate pain or headache. (Patient not taking: Reported on 09/08/2019)    . acetaminophen-codeine (TYLENOL #3) 300-30 MG tablet Take 1-2 tablets by mouth every 4 (four) hours as needed for moderate pain. (Patient not taking: Reported on 05/02/2018) 40 tablet 0  . promethazine (PHENERGAN) 25 MG tablet Take 1 tablet (25 mg total) by mouth every 6 (six) hours as needed for nausea. (Patient not taking: Reported on 05/02/2018) 42 tablet 2  . rizatriptan (MAXALT) 10 MG tablet Take 1 tablet (10 mg total) by mouth as needed for migraine. May repeat in 2 hours if needed (Patient not taking: Reported  on 09/08/2019) 10 tablet 2   No current facility-administered medications on file prior to visit.     Review of Systems:  All pertinent positive/negative included in HPI, all other review of systems are negative   Objective:  Physical Exam BP 129/86   Pulse 69   LMP 03/14/2018 (Exact Date)  CONSTITUTIONAL: Well-developed, well-nourished female in no acute distress.  EYES: EOM intact ENT: Normocephalic CARDIOVASCULAR: Regular rate   RESPIRATORY: Normal rate. Clear to auscultation bilaterally.  ENDOCRINE: Normal thyroid.  MUSCULOSKELETAL: Normal ROM SKIN: Warm, dry without erythema  NEUROLOGICAL: Alert, oriented, CN II-XII grossly intact, Appropriate balance PSYCH: Normal behavior, mood  PROCEDURE:  TRIGGER POINT INJECTIONS  Procedure: Mixture of 1%  Lidocaine, marcaine and dexamethazone in a ratio of 2:2:1  injected with 1cc each site in bilateral greater Occipital Nerves, bilateral lesser occipital nerves, bilateral cervical paraspinal muscles, bilateral trapezius.  Total amount injected: 10cc.  Pt tolerated procedure well.   Assessment & Plan:  Assessment: 1. Muscle spasm   2. Migraine without aura and with status migrainosus, not intractable   worsening of symptoms today - need injections  Plan: INjections for acute HA today.  Continue Emgality for prevention - careful to use every 30 days.   Other meds refilled as needed.   Follow-up in 3 months or sooner PRN  Paticia Stack, PA-C 09/08/2019 10:22 AM

## 2019-09-12 ENCOUNTER — Ambulatory Visit (HOSPITAL_COMMUNITY): Payer: Medicaid Other

## 2019-10-10 ENCOUNTER — Ambulatory Visit (HOSPITAL_COMMUNITY)
Admission: RE | Admit: 2019-10-10 | Discharge: 2019-10-10 | Disposition: A | Discharge status: Home / Self Care | Payer: Medicaid Other | Source: Ambulatory Visit | Attending: Physician Assistant | Admitting: Physician Assistant

## 2019-10-10 ENCOUNTER — Other Ambulatory Visit: Payer: Self-pay

## 2019-10-10 DIAGNOSIS — M545 Low back pain, unspecified: Secondary | ICD-10-CM

## 2019-11-13 ENCOUNTER — Other Ambulatory Visit: Payer: Self-pay | Admitting: *Deleted

## 2019-11-13 ENCOUNTER — Encounter: Payer: Self-pay | Admitting: *Deleted

## 2019-11-13 MED ORDER — CYCLOBENZAPRINE HCL 10 MG PO TABS: ORAL_TABLET | ORAL | 1 refills | Status: DC

## 2019-12-11 ENCOUNTER — Other Ambulatory Visit: Payer: Self-pay | Admitting: *Deleted

## 2019-12-11 NOTE — Telephone Encounter (Signed)
erroneous

## 2020-01-11 ENCOUNTER — Other Ambulatory Visit: Payer: Self-pay | Admitting: Physician Assistant

## 2020-02-19 ENCOUNTER — Encounter: Payer: Self-pay | Admitting: *Deleted

## 2020-03-01 ENCOUNTER — Other Ambulatory Visit: Payer: Self-pay

## 2020-03-01 ENCOUNTER — Ambulatory Visit (INDEPENDENT_AMBULATORY_CARE_PROVIDER_SITE_OTHER): Payer: Medicaid Other | Admitting: Physician Assistant

## 2020-03-01 ENCOUNTER — Encounter: Payer: Self-pay | Admitting: Physician Assistant

## 2020-03-01 VITALS — BP 130/82 | HR 90 | Wt 199.0 lb

## 2020-03-01 DIAGNOSIS — M62838 Other muscle spasm: Secondary | ICD-10-CM | POA: Diagnosis not present

## 2020-03-01 DIAGNOSIS — G43009 Migraine without aura, not intractable, without status migrainosus: Secondary | ICD-10-CM

## 2020-03-01 MED ORDER — REYVOW 100 MG PO TABS: 100.0000 mg | ORAL_TABLET | ORAL | 2 refills | Status: DC | PRN

## 2020-03-01 MED ORDER — METOCLOPRAMIDE HCL 10 MG PO TABS: 10.0000 mg | ORAL_TABLET | Freq: Four times a day (QID) | ORAL | 1 refills | Status: DC | PRN

## 2020-03-01 NOTE — Progress Notes (Signed)
History:  Tina Holder is a 34 y.o. [phone] who presents to clinic today for migraine followup. She is doing emgality 2 days earlier than scheduled and this is working better.  It wears off a bit early.   Relpax is now covered by insurance and works well.  If used early, it gives complete resolution.  If its not used early, it can last over a week.    Pt wants to continue with current regimen although noting she does not have adequate plan for when Relpax is not an option or should it not work.   She uses Fioricet occasionally when Relpax not available.      HIT6:66 Number of days in the last 4 weeks with:  Severe headache: 6 Moderate headache: 3 Mild headache: 3  No headache: 14   Past Medical History:  Diagnosis Date   Headache(784.0)    OTC med prn   Obesity    Pregnancy induced hypertension 2009, 2017   resolved after pregnancy   SVD (spontaneous vaginal delivery)    x 4    Social History   Socioeconomic History   Marital status: Married    Spouse name: Not on file   Number of children: 4   Years of education: Not on file   Highest education level: Not on file  Occupational History   Not on file  Tobacco Use   Smoking status: Never Smoker   Smokeless tobacco: Never Used  Vaping Use   Vaping Use: Never used  Substance and Sexual Activity   Alcohol use: No   Drug use: No   Sexual activity: Not Currently    Birth control/protection: None, Surgical    Comment: tubal  Other Topics Concern   Not on file  Social History Narrative   Not on file   Social Determinants of Health   Financial Resource Strain:    Difficulty of Paying Living Expenses: Not on file  Food Insecurity:    Worried About Running Out of Food in the Last Year: Not on file   [employer] of Food in the Last Year: Not on file  Transportation Needs:    Lack of Transportation (Medical): Not on file   Lack of Transportation (Non-Medical): Not on file  Physical Activity:     Days of Exercise per Week: Not on file   Minutes of Exercise per Session: Not on file  Stress:    Feeling of Stress : Not on file  Social Connections:    Frequency of Communication with Friends and Family: Not on file   Frequency of Social Gatherings with Friends and Family: Not on file   Attends Religious Services: Not on file   Active Member of Clubs or Organizations: Not on file   Attends Archivist Meetings: Not on file   Marital Status: Not on file  Intimate Partner Violence:    Fear of Current or Ex-Partner: Not on file   Emotionally Abused: Not on file   Physically Abused: Not on file   Sexually Abused: Not on file    Family History  Problem Relation Age of Onset   Arthritis Mother    Thyroid disease Maternal Grandmother    Lupus Maternal Grandmother     No Known Allergies  Current Outpatient Medications on File Prior to Visit  Medication Sig Dispense Refill   acetaminophen (TYLENOL) 500 MG tablet Take 1,000 mg by mouth every 6 (six) hours as needed for moderate pain or headache. (Patient not taking: Reported on  09/08/2019)     acetaminophen-codeine (TYLENOL #3) 300-30 MG tablet Take 1-2 tablets by mouth every 4 (four) hours as needed for moderate pain. (Patient not taking: Reported on 05/02/2018) 40 tablet 0   Butalbital-APAP-Caffeine 50-325-40 MG capsule Take 1-2 capsules by mouth every 6 (six) hours as needed for headache. 30 capsule 3   cyclobenzaprine (FLEXERIL) 10 MG tablet TAKE ONE TABLET BY MOUTH EVERY 8 HOURS AS NEEDED FOR MUSCLE SPASMS 60 tablet 1   eletriptan (RELPAX) 40 MG tablet Take 1 tablet (40 mg total) by mouth as needed for migraine or headache. May repeat in 2 hours if headache persists or recurs. 10 tablet 11   Galcanezumab-gnlm (EMGALITY) 120 MG/ML SOAJ Inject 240 mg into the skin as directed AND 120 mg every 30 (thirty) days. Inj 240mg  once then 120mg  monthly. 1 pen 11   metoCLOPramide (REGLAN) 10 MG tablet TAKE ONE TABLET  BY MOUTH FOUR TIMES A DAY AS NEEDED FOR NAUSEA AND VOMITING 30 tablet 1   promethazine (PHENERGAN) 25 MG tablet Take 1 tablet (25 mg total) by mouth every 6 (six) hours as needed for nausea. (Patient not taking: Reported on 05/02/2018) 42 tablet 2   No current facility-administered medications on file prior to visit.     Review of Systems:  All pertinent positive/negative included in HPI, all other review of systems are negative   Objective:  Physical Exam LMP 03/14/2018 (Exact Date)  CONSTITUTIONAL: Well-developed, well-nourished female in no acute distress.  EYES: EOM intact ENT: Normocephalic CARDIOVASCULAR: Regular rate   RESPIRATORY: Normal rate. .  MUSCULOSKELETAL: Normal ROM SKIN: Warm, dry without erythema  NEUROLOGICAL: Alert, oriented, CN II-XII grossly intact, Appropriate balance PSYCH: Normal behavior, mood   Assessment & Plan:  Assessment: 1. Migraine without aura and without status migrainosus, not intractable   2. Muscle spasm   Migraine somewhat improved but still needs better acute rescue strategy.  I believe she would benefit from further consideration of preventive strategy but will hold at this time due to patient preference.    Plan: Continue Emgality.  Sample provided.  Continue Relpax for acute therapy.  Encouraged pt to use early.  Reyvow is new medication for acute therapy.  She is advised not to combine closely with Relpax.  She is advised of the sedation and driving precautions (none for 8 hours) with this medication.  Sample provided.  Follow-up in 6 months or sooner PRN  Paticia Stack, PA-C 03/01/2020 10:49 AM

## 2020-04-01 ENCOUNTER — Other Ambulatory Visit: Payer: Self-pay | Admitting: *Deleted

## 2020-04-01 MED ORDER — CYCLOBENZAPRINE HCL 10 MG PO TABS: ORAL_TABLET | ORAL | 1 refills | Status: DC

## 2020-05-10 ENCOUNTER — Encounter: Payer: Self-pay | Admitting: *Deleted

## 2020-05-20 ENCOUNTER — Encounter: Payer: Self-pay | Admitting: *Deleted

## 2020-06-11 ENCOUNTER — Other Ambulatory Visit: Payer: Self-pay | Admitting: Physician Assistant

## 2020-08-09 ENCOUNTER — Other Ambulatory Visit: Payer: Self-pay | Admitting: Physician Assistant

## 2020-08-09 MED ORDER — REYVOW 100 MG PO TABS: 100.0000 mg | ORAL_TABLET | ORAL | 2 refills | Status: DC | PRN

## 2020-09-16 ENCOUNTER — Encounter: Payer: Self-pay | Admitting: Internal Medicine

## 2020-10-09 ENCOUNTER — Other Ambulatory Visit: Payer: Self-pay | Admitting: Physician Assistant

## 2020-10-10 ENCOUNTER — Other Ambulatory Visit: Payer: Self-pay

## 2020-10-10 DIAGNOSIS — G43009 Migraine without aura, not intractable, without status migrainosus: Secondary | ICD-10-CM

## 2020-10-10 MED ORDER — EMGALITY 120 MG/ML ~~LOC~~ SOAJ: SUBCUTANEOUS | 11 refills | Status: DC

## 2020-10-14 ENCOUNTER — Other Ambulatory Visit: Payer: Self-pay | Admitting: *Deleted

## 2020-10-14 MED ORDER — ELETRIPTAN HYDROBROMIDE 40 MG PO TABS: 40.0000 mg | ORAL_TABLET | ORAL | 5 refills | Status: DC | PRN

## 2020-10-24 ENCOUNTER — Encounter: Payer: Self-pay | Admitting: Internal Medicine

## 2020-10-24 ENCOUNTER — Telehealth: Payer: Self-pay | Admitting: Internal Medicine

## 2020-10-24 ENCOUNTER — Encounter: Payer: Self-pay | Admitting: *Deleted

## 2020-10-24 ENCOUNTER — Other Ambulatory Visit: Payer: Self-pay

## 2020-10-24 ENCOUNTER — Ambulatory Visit (INDEPENDENT_AMBULATORY_CARE_PROVIDER_SITE_OTHER): Payer: Medicaid Other | Admitting: Internal Medicine

## 2020-10-24 ENCOUNTER — Telehealth: Payer: Self-pay | Admitting: *Deleted

## 2020-10-24 VITALS — BP 124/84 | HR 67 | Temp 97.3°F | Ht 62.0 in | Wt 190.0 lb

## 2020-10-24 DIAGNOSIS — K219 Gastro-esophageal reflux disease without esophagitis: Secondary | ICD-10-CM

## 2020-10-24 DIAGNOSIS — R1319 Other dysphagia: Secondary | ICD-10-CM | POA: Diagnosis not present

## 2020-10-24 DIAGNOSIS — R110 Nausea: Secondary | ICD-10-CM | POA: Diagnosis not present

## 2020-10-24 NOTE — Telephone Encounter (Signed)
Called pt. She has rescheduled to 8/30 at 7:30am. Aware will mail new instructions. Message sent to endo

## 2020-10-24 NOTE — Telephone Encounter (Signed)
Pt called asking for Mindy. I told her she was at lunch. She wants to reschedule her procedure with Dr Abbey Chatters on 8/19 to 8/16. Please call [phone]

## 2020-10-24 NOTE — Patient Instructions (Signed)
We will schedule you for upper endoscopy with possible dilation to further evaluate your acid reflux, difficulty swallowing, nausea.  Continue on pantoprazole 40 mg twice daily for now.  We may make adjustments to this pending endoscopic findings.  Further recommendations to follow  It was nice meeting you today.  Dr. Loletha Grayer  Lifestyle and home remedies TO MANAGE REFLUX/HEARTBURN    You may eliminate or reduce the frequency of heartburn by making the following lifestyle changes:   Control your weight. Being overweight is a major risk factor for heartburn and GERD. Excess pounds put pressure on your abdomen, pushing up your stomach and causing acid to back up into your esophagus.    Eat smaller meals. 4 TO 6 MEALS A DAY. This reduces pressure on the lower esophageal sphincter, helping to prevent the valve from opening and acid from washing back into your esophagus.     Loosen your belt. Clothes that fit tightly around your waist put pressure on your abdomen and the lower esophageal sphincter.     Eliminate heartburn triggers. Everyone has specific triggers. Common triggers such as fatty or fried foods, spicy food, tomato sauce, carbonated beverages, alcohol, chocolate, mint, garlic, onion, caffeine and nicotine may make heartburn worse.    Avoid stooping or bending. Tying your shoes is OK. Bending over for longer periods to weed your garden isn't, especially soon after eating.    Don't lie down after a meal. Wait at least three to four hours after eating before going to bed, and don't lie down right after eating.   At [facility] we value your feedback. You may receive a survey about your visit today. Please share your experience as we strive to create trusting relationships with our patients to provide genuine, compassionate, quality care.  We appreciate your understanding and patience as we review any laboratory studies, imaging, and other diagnostic tests that are ordered  as we care for you. Our office policy is 5 business days for review of these results, and any emergent or urgent results are addressed in a timely manner for your best interest. If you do not hear from our office in 1 week, please contact us.   We also encourage the use of MyChart, which contains your medical information for your review as well. If you are not enrolled in this feature, an access code is on this after visit summary for your convenience. Thank you for allowing [place] to be involved in your care.  It was great to see you today!  I hope you have a great rest of your summer!!    Elon Alas. Abbey Chatters, D.O. Gastroenterology and Hepatology [facility]

## 2020-10-24 NOTE — Progress Notes (Signed)
Primary Care Physician:  Ginger Organ Primary Gastroenterologist:  Dr. Abbey Chatters  Chief Complaint  Patient presents with   Gastroesophageal Reflux    Started after having covid 03/2020. Takes Protonix bid-sometimes helps, sometimes doesn't help    HPI:   Tina Holder is a 35 y.o. female who presents to the clinic today by referral from her PCP Collene Mares for evaluation.  Patient states she had COVID in January and since that time she has had chronic acid reflux.  Notes symptoms are daily.  Symptoms are moderate.  Currently takes pantoprazole 40 mg twice daily which has decreased her symptoms some but she continues to have breakthrough symptoms on occasion.  Also notes progressively worsening dysphagia.  Primarily with solids, no issues with liquids.  Feels as though food will get stuck in her substernal region.  No chronic NSAID use.  No history of peptic ulcer disease.  No history of H. pylori.  No melena hematochezia.  No abdominal pain.  Does have nausea as well without vomiting.  Has as needed Phenergan to take for this.  Otherwise no other complaints.  Past Medical History:  Diagnosis Date   Headache(784.0)    OTC med prn   Obesity    Pregnancy induced hypertension 2009, 2017   resolved after pregnancy   SVD (spontaneous vaginal delivery)    x 4    Past Surgical History:  Procedure Laterality Date   LAPAROSCOPY N/A 04/15/2018   Procedure: DIAGNOSTIC LAPAROSCOPY WITH EVACUATION OF HEMATOMA;  Surgeon: [name], MD;  Location: [facility];  Service: Gynecology;  Laterality: N/A;   REPAIR VAGINAL CUFF N/A 04/15/2018   Procedure: REPAIR VAGINAL CUFF;  Surgeon: [name], MD;  Location: [facility];  Service: Gynecology;  Laterality: N/A;   TONSILLECTOMY  2005   TUBAL LIGATION  01/01/2012   Procedure: POST PARTUM TUBAL LIGATION;  Surgeon: Lavonia Drafts, MD;  Location: [facility];  Service: Gynecology;  Laterality: Bilateral;  with filshie clips   VAGINAL  HYSTERECTOMY Right 04/12/2018   Procedure: HYSTERECTOMY VAGINAL WITH RIGHT SALPINGECTOMY;  Surgeon: [name], MD;  Location: [facility];  Service: Gynecology;  Laterality: Right;   WISDOM TOOTH EXTRACTION      Current Outpatient Medications  Medication Sig Dispense Refill   acetaminophen (TYLENOL) 500 MG tablet Take 1,000 mg by mouth every 6 (six) hours as needed for moderate pain or headache.     cyclobenzaprine (FLEXERIL) 10 MG tablet TAKE ONE TABLET BY MOUTH EVERY 8 HOURS AS NEEDED FOR MUSCLE SPASMS 60 tablet 1   eletriptan (RELPAX) 40 MG tablet Take 1 tablet (40 mg total) by mouth as needed for migraine or headache. May repeat in 2 hours if headache persists or recurs. 10 tablet 5   EMGALITY 120 MG/ML SOAJ Inject 240 mg into the skin as directed AND 120 mg every 30 (thirty) days. Inj '240mg'$  once then '120mg'$  monthly. 1 mL 11   metoCLOPramide (REGLAN) 10 MG tablet Take 1 tablet (10 mg total) by mouth every 6 (six) hours as needed for nausea. 30 tablet 1   pantoprazole (PROTONIX) 40 MG tablet Take 40 mg by mouth 2 (two) times daily.     promethazine (PHENERGAN) 25 MG tablet Take 1 tablet (25 mg total) by mouth every 6 (six) hours as needed for nausea. 42 tablet 2   acetaminophen-codeine (TYLENOL #3) 300-30 MG tablet Take 1-2 tablets by mouth every 4 (four) hours as needed for moderate pain. (Patient not taking: No sig  reported) 40 tablet 0   Butalbital-APAP-Caffeine 50-325-40 MG capsule Take 1-2 capsules by mouth every 6 (six) hours as needed for headache. (Patient not taking: Reported on 10/24/2020) 30 capsule 3   Lasmiditan Succinate (REYVOW) 100 MG TABS Take 100 mg by mouth as needed. (Patient not taking: Reported on 10/24/2020) 10 tablet 2   No current facility-administered medications for this visit.    Allergies as of 10/24/2020   (No Known Allergies)    Family History  Problem Relation Age of Onset   Arthritis Mother    Thyroid disease Maternal Grandmother    Lupus Maternal  Grandmother     Social History   Socioeconomic History   Marital status: Married    Spouse name: Not on file   Number of children: 4   Years of education: Not on file   Highest education level: Not on file  Occupational History   Not on file  Tobacco Use   Smoking status: Never   Smokeless tobacco: Never  Vaping Use   Vaping Use: Never used  Substance and Sexual Activity   Alcohol use: No   Drug use: No   Sexual activity: Not Currently    Birth control/protection: None, Surgical    Comment: tubal  Other Topics Concern   Not on file  Social History Narrative   Not on file   Social Determinants of Health   Financial Resource Strain: Not on file  Food Insecurity: Not on file  Transportation Needs: Not on file  Physical Activity: Not on file  Stress: Not on file  Social Connections: Not on file  Intimate Partner Violence: Not on file    Subjective: Review of Systems  Constitutional:  Negative for chills and fever.  HENT:  Negative for congestion and hearing loss.   Eyes:  Negative for blurred vision and double vision.  Respiratory:  Negative for cough and shortness of breath.   Cardiovascular:  Negative for chest pain and palpitations.  Gastrointestinal:  Positive for heartburn and nausea. Negative for abdominal pain, blood in stool, constipation, diarrhea, melena and vomiting.       Dysphagia  Genitourinary:  Negative for dysuria and urgency.  Musculoskeletal:  Negative for joint pain and myalgias.  Skin:  Negative for itching and rash.  Neurological:  Negative for dizziness and headaches.  Psychiatric/Behavioral:  Negative for depression. The patient is not nervous/anxious.       Objective: BP 124/84   Pulse 67   Temp (!) 97.3 F (36.3 C) (Temporal)   Ht '5\' 2"'$  (1.575 m)   Wt 190 lb (86.2 kg)   LMP 03/14/2018 (Exact Date)   BMI 34.75 kg/m  Physical Exam Constitutional:      Appearance: Normal appearance.  HENT:     Head: Normocephalic and  atraumatic.  Eyes:     Extraocular Movements: Extraocular movements intact.     Conjunctiva/sclera: Conjunctivae normal.  Cardiovascular:     Rate and Rhythm: Normal rate and regular rhythm.  Pulmonary:     Effort: Pulmonary effort is normal.     Breath sounds: Normal breath sounds.  Abdominal:     General: Bowel sounds are normal.     Palpations: Abdomen is soft.  Musculoskeletal:        General: No swelling. Normal range of motion.     Cervical back: Normal range of motion and neck supple.  Skin:    General: Skin is warm and dry.     Coloration: Skin is not jaundiced.  Neurological:  General: No focal deficit present.     Mental Status: She is alert and oriented to person, place, and time.  Psychiatric:        Mood and Affect: Mood normal.        Behavior: Behavior normal.     Assessment: *GERD-moderate, breakthrough symptoms on pantoprazole twice daily *Dysphagia-new, worsening *Nausea-intermittent  Plan: Will schedule for EGD with possible dilation to evaluate for peptic ulcer disease, esophagitis, gastritis, H. Pylori, duodenitis, or other. Will also evaluate for esophageal stricture, Schatzki's ring, esophageal web or other.   The risks including infection, bleed, or perforation as well as benefits, limitations, alternatives and imponderables have been reviewed with the patient. Potential for esophageal dilation, biopsy, etc. have also been reviewed.  Questions have been answered. All parties agreeable.  Continue on pantoprazole 40 mg twice daily for now.  We may need to make adjustments to this depending on endoscopic findings.  I will print off home practices to decrease reflux.  Thank you Collene Mares for the kind referral.   10/24/2020 10:21 AM   Disclaimer: This note was dictated with voice recognition software. Similar sounding words can inadvertently be transcribed and may not be corrected upon review.

## 2020-10-24 NOTE — Telephone Encounter (Signed)
PA submitted via [employer] website. PA pending review. Ref# [id number]

## 2020-10-28 NOTE — Telephone Encounter (Signed)
PA approved. Auth# [id number] DOS 11/15/2020-01/14/2021

## 2020-10-30 ENCOUNTER — Ambulatory Visit: Payer: Medicaid Other | Admitting: Internal Medicine

## 2020-11-05 ENCOUNTER — Other Ambulatory Visit: Payer: Self-pay | Admitting: *Deleted

## 2020-11-05 ENCOUNTER — Telehealth: Payer: Self-pay | Admitting: Radiology

## 2020-11-05 MED ORDER — PROMETHAZINE HCL 25 MG PO TABS: 25.0000 mg | ORAL_TABLET | Freq: Four times a day (QID) | ORAL | 1 refills | Status: DC | PRN

## 2020-11-05 NOTE — Telephone Encounter (Signed)
Patient called c/o severe headache needing message to be sent to Allie Dimmer to call patient. Contacted Santiago Glad requesting that she contact patient. Santiago Glad called patient and then called office requesting that a prescription be sent to [facility] in [place] for promethazine 25 mg 1 Po q 4-6 hrs for nausea and headache, quantity 20 with 1 refill, Crosby Oyster, RN notified, medication sent to pharmacy.

## 2020-11-26 ENCOUNTER — Ambulatory Visit (HOSPITAL_COMMUNITY): Payer: Medicaid Other | Admitting: Anesthesiology

## 2020-11-26 ENCOUNTER — Encounter (HOSPITAL_COMMUNITY)
Admission: RE | Disposition: A | Discharge status: Home / Self Care | Payer: Self-pay | Source: Home / Self Care | Attending: Internal Medicine

## 2020-11-26 ENCOUNTER — Ambulatory Visit (HOSPITAL_COMMUNITY)
Admission: RE | Admit: 2020-11-26 | Discharge: 2020-11-26 | Disposition: A | Discharge status: Home / Self Care | Payer: Medicaid Other | Attending: Internal Medicine | Admitting: Internal Medicine

## 2020-11-26 ENCOUNTER — Other Ambulatory Visit: Payer: Self-pay

## 2020-11-26 ENCOUNTER — Encounter (HOSPITAL_COMMUNITY): Payer: Self-pay

## 2020-11-26 DIAGNOSIS — R1310 Dysphagia, unspecified: Secondary | ICD-10-CM | POA: Insufficient documentation

## 2020-11-26 DIAGNOSIS — K2970 Gastritis, unspecified, without bleeding: Secondary | ICD-10-CM

## 2020-11-26 DIAGNOSIS — R12 Heartburn: Secondary | ICD-10-CM | POA: Insufficient documentation

## 2020-11-26 DIAGNOSIS — K222 Esophageal obstruction: Secondary | ICD-10-CM | POA: Diagnosis not present

## 2020-11-26 DIAGNOSIS — K319 Disease of stomach and duodenum, unspecified: Secondary | ICD-10-CM | POA: Diagnosis not present

## 2020-11-26 DIAGNOSIS — Z79899 Other long term (current) drug therapy: Secondary | ICD-10-CM | POA: Diagnosis not present

## 2020-11-26 HISTORY — PX: BALLOON DILATION: SHX5330

## 2020-11-26 HISTORY — PX: BIOPSY: SHX5522

## 2020-11-26 HISTORY — PX: ESOPHAGOGASTRODUODENOSCOPY (EGD) WITH PROPOFOL: SHX5813

## 2020-11-26 SURGERY — ESOPHAGOGASTRODUODENOSCOPY (EGD) WITH PROPOFOL
Anesthesia: General

## 2020-11-26 MED ORDER — PROPOFOL 1000 MG/100ML IV EMUL
INTRAVENOUS | Status: AC
  Filled 2020-11-26: qty 100

## 2020-11-26 MED ORDER — PROPOFOL 10 MG/ML IV BOLUS
INTRAVENOUS | Status: DC | PRN
Start: 2020-11-26 — End: 2020-11-26
  Administered 2020-11-26: 100 mg via INTRAVENOUS
  Administered 2020-11-26 (×2): 50 mg via INTRAVENOUS

## 2020-11-26 MED ORDER — LACTATED RINGERS IV SOLN
INTRAVENOUS | Status: DC
  Administered 2020-11-26: 1000 mL via INTRAVENOUS

## 2020-11-26 MED ORDER — LIDOCAINE HCL (PF) 2 % IJ SOLN
INTRAMUSCULAR | Status: AC
  Filled 2020-11-26: qty 20

## 2020-11-26 MED ORDER — LIDOCAINE HCL (CARDIAC) PF 100 MG/5ML IV SOSY
PREFILLED_SYRINGE | INTRAVENOUS | Status: DC | PRN
  Administered 2020-11-26: 100 mg via INTRAVENOUS

## 2020-11-26 NOTE — Transfer of Care (Signed)
Immediate Anesthesia Transfer of Care Note  Patient: Tina Holder  Procedure(s) Performed: ESOPHAGOGASTRODUODENOSCOPY (EGD) WITH PROPOFOL BALLOON DILATION BIOPSY  Patient Location: Endoscopy Unit  Anesthesia Type:General  Level of Consciousness: awake and alert   Airway & Oxygen Therapy: Patient Spontanous Breathing  Post-op Assessment: Report given to RN and Post -op Vital signs reviewed and stable  Post vital signs: Reviewed and stable  Last Vitals:  Vitals Value Taken Time  BP    Temp    Pulse 74 11/26/20  0750  Resp 14 11/26/20  0750  SpO2 95 11/26/20  0750    Last Pain:  Vitals:   11/26/20 0733  TempSrc:   PainSc: 0-No pain      Patients Stated Pain Goal: 6 ([id number])  Complications: No notable events documented.

## 2020-11-26 NOTE — Op Note (Signed)
[facility] Patient Name: Tina Holder Procedure Date: 11/26/2020 7:13 AM MRN: [id number] Date of Birth: 1985/05/12 Attending MD: Elon Alas. Abbey Chatters DO CSN: [id number] Age: 35 Admit Type: Outpatient Procedure:                Upper GI endoscopy Indications:              Dysphagia, Heartburn Providers:                Elon Alas. Abbey Chatters, DO, Crystal Page, Nelma Rothman,                            Technician Referring MD:              Medicines:                See the Anesthesia note for documentation of the                            administered medications Complications:            No immediate complications. Estimated Blood Loss:     Estimated blood loss was minimal. Procedure:                Pre-Anesthesia Assessment:                           - The anesthesia plan was to use monitored                            anesthesia care (MAC).                           After obtaining informed consent, the endoscope was                            passed under direct vision. Throughout the                            procedure, the patient's blood pressure, pulse, and                            oxygen saturations were monitored continuously. The                            [id number]-H190 ([id number]) scope was introduced through the                            mouth, and advanced to the second part of duodenum.                            The upper GI endoscopy was accomplished without                            difficulty. The patient tolerated the procedure                            well. Scope In: 9:16:94 AM Scope Out:  7:44:38 AM Total Procedure Duration: 0 hours 6 minutes 27 seconds  Findings:      There is no endoscopic evidence of Barrett's esophagus, bleeding, areas       of erosion, esophagitis, ulcerations or varices in the entire esophagus.      A mild Schatzki ring was found in the lower third of the esophagus. A       TTS dilator was passed through the scope. Dilation with an 18-19-20  mm       balloon dilator was performed to 20 mm. The dilation site was examined       and showed moderate improvement in luminal narrowing.      Biopsies were taken with a cold forceps in the middle third of the       esophagus for histology.      Localized mild inflammation characterized by erythema and linear       erosions was found in the gastric antrum. Biopsies were taken with a       cold forceps for Helicobacter pylori testing.      The duodenal bulb, first portion of the duodenum and second portion of       the duodenum were normal. Impression:               - Mild Schatzki ring. Dilated.                           - Gastritis. Biopsied.                           - Normal duodenal bulb, first portion of the                            duodenum and second portion of the duodenum.                           - Biopsies were taken with a cold forceps for                            histology in the middle third of the esophagus. Moderate Sedation:      Per Anesthesia Care Recommendation:           - Patient has a contact number available for                            emergencies. The signs and symptoms of potential                            delayed complications were discussed with the                            patient. Return to normal activities tomorrow.                            Written discharge instructions were provided to the                            patient.                           -  Resume previous diet.                           - Continue present medications.                           - Await pathology results.                           - Repeat upper endoscopy PRN for retreatment.                           - Return to GI clinic in 6 months. Procedure Code(s):        --- Professional ---                           [phone], Esophagogastroduodenoscopy, flexible,                            transoral; with transendoscopic balloon dilation of                             esophagus (less than 30 mm diameter)                           [id number], 59, Esophagogastroduodenoscopy, flexible,                            transoral; with biopsy, single or multiple Diagnosis Code(s):        --- Professional ---                           K22.2, Esophageal obstruction                           K29.70, Gastritis, unspecified, without bleeding                           R13.10, Dysphagia, unspecified                           R12, Heartburn CPT copyright 2019 American Medical Association. All rights reserved. The codes documented in this report are preliminary and upon coder review may  be revised to meet current compliance requirements. Elon Alas. Abbey Chatters, DO [place] Abbey Chatters, DO 11/26/2020 7:48:54 AM This report has been signed electronically. Number of Addenda: 0

## 2020-11-26 NOTE — Discharge Instructions (Addendum)
EGD Discharge instructions Please read the instructions outlined below and refer to this sheet in the next few weeks. These discharge instructions provide you with general information on caring for yourself after you leave the hospital. Your doctor may also give you specific instructions. While your treatment has been planned according to the most current medical practices available, unavoidable complications occasionally occur. If you have any problems or questions after discharge, please call your doctor. ACTIVITY You may resume your regular activity but move at a slower pace for the next 24 hours.  Take frequent rest periods for the next 24 hours.  Walking will help expel (get rid of) the air and reduce the bloated feeling in your abdomen.  No driving for 24 hours (because of the anesthesia (medicine) used during the test).  You may shower.  Do not sign any important legal documents or operate any machinery for 24 hours (because of the anesthesia used during the test).  NUTRITION Drink plenty of fluids.  You may resume your normal diet.  Begin with a light meal and progress to your normal diet.  Avoid alcoholic beverages for 24 hours or as instructed by your caregiver.  MEDICATIONS You may resume your normal medications unless your caregiver tells you otherwise.  WHAT YOU CAN EXPECT TODAY You may experience abdominal discomfort such as a feeling of fullness or "gas" pains.  FOLLOW-UP Your doctor will discuss the results of your test with you.  SEEK IMMEDIATE MEDICAL ATTENTION IF ANY OF THE FOLLOWING OCCUR: Excessive nausea (feeling sick to your stomach) and/or vomiting.  Severe abdominal pain and distention (swelling).  Trouble swallowing.  Temperature over 101 F (37.8 C).  Rectal bleeding or vomiting of blood.   Your EGD revealed a mild amount of inflammation in your stomach.  I took biopsies of this to rule out infection with a bacteria called H. pylori.  Also took biopsies of your  esophagus as well.  Await pathology results, my office will contact you likely next week.  You did have a slight narrowing of your esophagus so I did stretch this with a balloon.  Hopefully this helps with your swallowing.  Continue on pantoprazole twice daily.  This medication works best if you take it 30 minutes before breakfast and 30 minutes before dinner.  Avoid NSAIDs as best as you can.  Follow-up with GI in 6 months.   I hope you have a great rest of your week!  Elon Alas. Abbey Chatters, D.O. Gastroenterology and Hepatology [facility]

## 2020-11-26 NOTE — Anesthesia Postprocedure Evaluation (Signed)
Anesthesia Post Note  Patient: Tina Holder  Procedure(s) Performed: ESOPHAGOGASTRODUODENOSCOPY (EGD) WITH PROPOFOL BALLOON DILATION BIOPSY  Patient location during evaluation: PACU Anesthesia Type: General Level of consciousness: awake and alert Pain management: pain level controlled Vital Signs Assessment: post-procedure vital signs reviewed and stable Respiratory status: spontaneous breathing and respiratory function stable Cardiovascular status: blood pressure returned to baseline and stable Postop Assessment: no apparent nausea or vomiting Anesthetic complications: no   No notable events documented.   Last Vitals:  Vitals:   11/26/20 0653 11/26/20 0751  BP: 114/78 (!) 97/55  Pulse: 72   Resp: 20 13  Temp: 36.6 C 36.5 C  SpO2: 99%     Last Pain:  Vitals:   11/26/20 0751  TempSrc: Oral  PainSc: 0-No pain                 Heavan [name] C Makinna Andy

## 2020-11-26 NOTE — Anesthesia Preprocedure Evaluation (Addendum)
Anesthesia Evaluation  Patient identified by MRN, date of birth, ID band Patient awake    Reviewed: Allergy & Precautions, NPO status , Patient's Chart, lab work & pertinent test results  Airway Mallampati: I  TM Distance: >3 FB Neck ROM: Full    Dental  (+) Dental Advisory Given Crowns :   Pulmonary asthma ,    Pulmonary exam normal breath sounds clear to auscultation       Cardiovascular Exercise Tolerance: Good hypertension, Pt. on medications Normal cardiovascular exam Rhythm:Regular Rate:Normal     Neuro/Psych  Headaches, negative psych ROS   GI/Hepatic Neg liver ROS, GERD  Medicated and Poorly Controlled,  Endo/Other  negative endocrine ROS  Renal/GU negative Renal ROS     Musculoskeletal negative musculoskeletal ROS (+)   Abdominal   Peds  Hematology negative hematology ROS (+)   Anesthesia Other Findings   Reproductive/Obstetrics negative OB ROS                            Anesthesia Physical Anesthesia Plan  ASA: 2  Anesthesia Plan: General   Post-op Pain Management:    Induction: Intravenous  PONV Risk Score and Plan: TIVA  Airway Management Planned: Nasal Cannula and Natural Airway  Additional Equipment:   Intra-op Plan:   Post-operative Plan:   Informed Consent: I have reviewed the patients History and Physical, chart, labs and discussed the procedure including the risks, benefits and alternatives for the proposed anesthesia with the patient or authorized representative who has indicated his/her understanding and acceptance.     Dental advisory given  Plan Discussed with: CRNA and Surgeon  Anesthesia Plan Comments:         Anesthesia Quick Evaluation

## 2020-11-26 NOTE — H&P (Signed)
Primary Care Physician:  Ginger Organ Primary Gastroenterologist:  Dr. Abbey Chatters  Pre-Procedure History & Physical: HPI:  Tina Holder is a 35 y.o. female is here for an EGD with possible dilation for GERD and dysphagia.   Patient states she had COVID in January and since that time she has had chronic acid reflux.  Notes symptoms are daily.  Symptoms are moderate.  Currently takes pantoprazole 40 mg twice daily which has decreased her symptoms some but she continues to have breakthrough symptoms on occasion.  Also notes progressively worsening dysphagia.  Primarily with solids, no issues with liquids.  Feels as though food will get stuck in her substernal region.  No chronic NSAID use.  No history of peptic ulcer disease.  No history of H. pylori.  No melena hematochezia.  No abdominal pain.  Does have nausea as well without vomiting.  Has as needed Phenergan to take for this.  Otherwise no other complaints.  Past Medical History:  Diagnosis Date   Headache(784.0)    OTC med prn   Obesity    Pregnancy induced hypertension 2009, 2017   resolved after pregnancy   SVD (spontaneous vaginal delivery)    x 4    Past Surgical History:  Procedure Laterality Date   LAPAROSCOPY N/A 04/15/2018   Procedure: DIAGNOSTIC LAPAROSCOPY WITH EVACUATION OF HEMATOMA;  Surgeon: [name], MD;  Location: [facility];  Service: Gynecology;  Laterality: N/A;   REPAIR VAGINAL CUFF N/A 04/15/2018   Procedure: REPAIR VAGINAL CUFF;  Surgeon: [name], MD;  Location: [facility];  Service: Gynecology;  Laterality: N/A;   TONSILLECTOMY  2005   TUBAL LIGATION  01/01/2012   Procedure: POST PARTUM TUBAL LIGATION;  Surgeon: Lavonia Drafts, MD;  Location: [facility];  Service: Gynecology;  Laterality: Bilateral;  with filshie clips   VAGINAL HYSTERECTOMY Right 04/12/2018   Procedure: HYSTERECTOMY VAGINAL WITH RIGHT SALPINGECTOMY;  Surgeon: [name], MD;  Location: [facility];  Service: Gynecology;  Laterality:  Right;   WISDOM TOOTH EXTRACTION      Prior to Admission medications   Medication Sig Start Date End Date Taking? Authorizing Provider  calcium carbonate (TUMS - DOSED IN MG ELEMENTAL CALCIUM) 500 MG chewable tablet Chew 1 tablet by mouth daily.   Yes [provider]  cyclobenzaprine (FLEXERIL) 10 MG tablet TAKE ONE TABLET BY MOUTH EVERY 8 HOURS AS NEEDED FOR MUSCLE SPASMS Patient taking differently: Take 10 mg by mouth at bedtime as needed for muscle spasms. 10/18/20  Yes Teague Bobbye Morton, PA-C  eletriptan (RELPAX) 40 MG tablet Take 1 tablet (40 mg total) by mouth as needed for migraine or headache. May repeat in 2 hours if headache persists or recurs. 10/14/20  Yes Teague Bobbye Morton, PA-C  EMGALITY 120 MG/ML SOAJ Inject 240 mg into the skin as directed AND 120 mg every 30 (thirty) days. Inj '240mg'$  once then '120mg'$  monthly. 10/10/20  Yes Teague Bobbye Morton, PA-C  metoCLOPramide (REGLAN) 10 MG tablet Take 1 tablet (10 mg total) by mouth every 6 (six) hours as needed for nausea. 03/01/20  Yes Teague Bobbye Morton, PA-C  pantoprazole (PROTONIX) 40 MG tablet Take 40 mg by mouth 2 (two) times daily. 10/07/20  Yes [provider]  promethazine (PHENERGAN) 25 MG tablet Take 1 tablet (25 mg total) by mouth every 6 (six) hours as needed for nausea (and headache). 11/05/20  Yes Teague Bobbye Morton, PA-C  Lasmiditan Succinate (REYVOW) 100 MG TABS Take  100 mg by mouth as needed. Patient not taking: Reported on 11/22/2020 08/09/20   Jaclyn Prime, Collene Leyden, PA-C    Allergies as of 10/24/2020   (No Known Allergies)    Family History  Problem Relation Age of Onset   Arthritis Mother    Thyroid disease Maternal Grandmother    Lupus Maternal Grandmother     Social History   Socioeconomic History   Marital status: Married    Spouse name: Not on file   Number of children: 4   Years of education: Not on file   Highest education level: Not on file  Occupational History   Not on  file  Tobacco Use   Smoking status: Never   Smokeless tobacco: Never  Vaping Use   Vaping Use: Never used  Substance and Sexual Activity   Alcohol use: No   Drug use: No   Sexual activity: Not Currently    Birth control/protection: None, Surgical    Comment: tubal  Other Topics Concern   Not on file  Social History Narrative   Not on file   Social Determinants of Health   Financial Resource Strain: Not on file  Food Insecurity: Not on file  Transportation Needs: Not on file  Physical Activity: Not on file  Stress: Not on file  Social Connections: Not on file  Intimate Partner Violence: Not on file    Review of Systems: See HPI, otherwise negative ROS  Physical Exam: Vital signs in last 24 hours: Temp:  [97.9 F (36.6 C)] 97.9 F (36.6 C) (08/30 0653) Pulse Rate:  [72] 72 (08/30 0653) Resp:  [20] 20 (08/30 0653) BP: (114)/(78) 114/78 (08/30 0653) SpO2:  [99 %] 99 % (08/30 0653)   General:   Alert,  Well-developed, well-nourished, pleasant and cooperative in NAD Head:  Normocephalic and atraumatic. Eyes:  Sclera clear, no icterus.   Conjunctiva pink. Ears:  Normal auditory acuity. Nose:  No deformity, discharge,  or lesions. Mouth:  No deformity or lesions, dentition normal. Neck:  Supple; no masses or thyromegaly. Lungs:  Clear throughout to auscultation.   No wheezes, crackles, or rhonchi. No acute distress. Heart:  Regular rate and rhythm; no murmurs, clicks, rubs,  or gallops. Abdomen:  Soft, nontender and nondistended. No masses, hepatosplenomegaly or hernias noted. Normal bowel sounds, without guarding, and without rebound.   Msk:  Symmetrical without gross deformities. Normal posture. Extremities:  Without clubbing or edema. Neurologic:  Alert and  oriented x4;  grossly normal neurologically. Skin:  Intact without significant lesions or rashes. Cervical Nodes:  No significant cervical adenopathy. Psych:  Alert and cooperative. Normal mood and  affect.  Impression/Plan: [name] is here for an EGD with possible dilation for GERD and dysphagia.   The risks of the procedure including infection, bleed, or perforation as well as benefits, limitations, alternatives and imponderables have been reviewed with the patient. Questions have been answered. All parties agreeable.

## 2020-11-27 LAB — SURGICAL PATHOLOGY

## 2020-11-28 ENCOUNTER — Telehealth: Payer: Self-pay

## 2020-11-28 NOTE — Telephone Encounter (Signed)
noted 

## 2020-11-28 NOTE — Telephone Encounter (Signed)
Dr. Abbey Chatters, Pt phoned and advised that she had EGD Tuesday and every since then every time she eats it hurts at the top of her abdomen center. Pt want's to know if this is normal. She does not have any other symptoms. Please advise

## 2020-11-28 NOTE — Telephone Encounter (Signed)
I called and spoke with patient.  She states she is having mild pain in her epigastric region when she eats.  No nausea or vomiting, having normal bowel movements.  No melena hematochezia.  No abdominal pain otherwise.  No alarm symptoms to warrant urgent evaluation.  I told her to just watch it for now.  This should improve over the next few days.  She did have esophageal dilation performed though does not appear to have alarm symptoms of perforation.  Recommended that if her symptoms worsen to seek emergent care.  If she is still sore in a few days then call us and we can send in some viscous lidocaine.  Otherwise follow-up with GI as previously scheduled.

## 2020-12-04 ENCOUNTER — Ambulatory Visit: Payer: Medicaid Other | Admitting: Internal Medicine

## 2020-12-04 ENCOUNTER — Encounter (HOSPITAL_COMMUNITY): Payer: Self-pay | Admitting: Internal Medicine

## 2020-12-05 ENCOUNTER — Encounter (HOSPITAL_COMMUNITY): Payer: Self-pay

## 2020-12-05 ENCOUNTER — Emergency Department (HOSPITAL_COMMUNITY): Payer: Medicaid Other

## 2020-12-05 ENCOUNTER — Emergency Department (HOSPITAL_COMMUNITY)
Admission: EM | Admit: 2020-12-05 | Discharge: 2020-12-05 | Disposition: A | Discharge status: Home / Self Care | Payer: Medicaid Other | Attending: Emergency Medicine | Admitting: Emergency Medicine

## 2020-12-05 ENCOUNTER — Other Ambulatory Visit: Payer: Self-pay

## 2020-12-05 DIAGNOSIS — R52 Pain, unspecified: Secondary | ICD-10-CM

## 2020-12-05 DIAGNOSIS — R1013 Epigastric pain: Secondary | ICD-10-CM | POA: Insufficient documentation

## 2020-12-05 DIAGNOSIS — J45909 Unspecified asthma, uncomplicated: Secondary | ICD-10-CM | POA: Insufficient documentation

## 2020-12-05 DIAGNOSIS — K219 Gastro-esophageal reflux disease without esophagitis: Secondary | ICD-10-CM | POA: Insufficient documentation

## 2020-12-05 LAB — LIPASE, BLOOD: Lipase: 35 U/L (ref 11–51)

## 2020-12-05 LAB — CBC WITH DIFFERENTIAL/PLATELET
Abs Immature Granulocytes: 0.01 10*3/uL (ref 0.00–0.07)
Basophils Absolute: 0 10*3/uL (ref 0.0–0.1)
Basophils Relative: 0 %
Eosinophils Absolute: 0 10*3/uL (ref 0.0–0.5)
Eosinophils Relative: 1 %
HCT: 40.8 % (ref 36.0–46.0)
Hemoglobin: 14 g/dL (ref 12.0–15.0)
Immature Granulocytes: 0 %
Lymphocytes Relative: 32 %
Lymphs Abs: 1.3 10*3/uL (ref 0.7–4.0)
MCH: 31.5 pg (ref 26.0–34.0)
MCHC: 34.3 g/dL (ref 30.0–36.0)
MCV: 91.7 fL (ref 80.0–100.0)
Monocytes Absolute: 0.4 10*3/uL (ref 0.1–1.0)
Monocytes Relative: 10 %
Neutro Abs: 2.3 10*3/uL (ref 1.7–7.7)
Neutrophils Relative %: 57 %
Platelets: 184 10*3/uL (ref 150–400)
RBC: 4.45 MIL/uL (ref 3.87–5.11)
RDW: 12.2 % (ref 11.5–15.5)
WBC: 4 10*3/uL (ref 4.0–10.5)
nRBC: 0 % (ref 0.0–0.2)

## 2020-12-05 LAB — COMPREHENSIVE METABOLIC PANEL
ALT: 14 U/L (ref 0–44)
AST: 17 U/L (ref 15–41)
Albumin: 4.3 g/dL (ref 3.5–5.0)
Alkaline Phosphatase: 47 U/L (ref 38–126)
Anion gap: 8 (ref 5–15)
BUN: 14 mg/dL (ref 6–20)
CO2: 27 mmol/L (ref 22–32)
Calcium: 9.3 mg/dL (ref 8.9–10.3)
Chloride: 105 mmol/L (ref 98–111)
Creatinine, Ser: 0.88 mg/dL (ref 0.44–1.00)
GFR, Estimated: >60 mL/min (ref >60)
Glucose, Bld: 91 mg/dL (ref 70–99)
Potassium: 4.3 mmol/L (ref 3.5–5.1)
Sodium: 140 mmol/L (ref 135–145)
Total Bilirubin: 0.7 mg/dL (ref 0.3–1.2)
Total Protein: 6.4 g/dL — ABNORMAL LOW (ref 6.5–8.1)

## 2020-12-05 LAB — I-STAT BETA HCG BLOOD, ED (MC, WL, AP ONLY): I-stat hCG, quantitative: <5 m[IU]/mL (ref <5)

## 2020-12-05 LAB — TROPONIN I (HIGH SENSITIVITY)
Troponin I (High Sensitivity): <2 ng/L (ref <18)
Troponin I (High Sensitivity): <2 ng/L (ref <18)

## 2020-12-05 MED ORDER — IOHEXOL 350 MG/ML SOLN
85.0000 mL | Freq: Once | INTRAVENOUS | Status: AC | PRN
  Administered 2020-12-05: 85 mL via INTRAVENOUS

## 2020-12-05 MED ORDER — SUCRALFATE 1 GM/10ML PO SUSP: 1.0000 g | Freq: Three times a day (TID) | ORAL | 0 refills | Status: DC

## 2020-12-05 NOTE — ED Triage Notes (Signed)
Pt c/o chest pain and upper adb pain since Friday.  Reports had EGD and esophagus stretched Tuesday.  Reports they did biopsies on a couple of places during the procedure.  Denies any n/v.  Reports pain is in center of chest and epigastric area, denies pain radiating anywhere.  Denies being sore to touch.  Denies any sob.

## 2020-12-05 NOTE — Telephone Encounter (Signed)
FYI: Returned the pt's call and was advised of the anxiety almost like panic attacks. The discomfort underneath the breast bone area still hurts some. I read the note from 9/1 regarding Dr. Reola Mosher instructions to the pt that if her symptoms got worse to seek emergency treatment. (Last week anxiety/panic attack feeling were not mentioned in the note.) once again I advised since it has been 3 days of this to seek treatment at PCP office or ER work-up. The pt expressed understanding.

## 2020-12-05 NOTE — Discharge Instructions (Signed)
Continue your Protonix.  Start the Carafate.  Make an appointment to follow-up with gastroenterology.  Return for any new or worse symptoms.  Today's work-up without any acute findings.

## 2020-12-05 NOTE — ED Provider Notes (Addendum)
[facility] EMERGENCY DEPARTMENT Provider Note   CSN: [id number] Arrival date & time: 12/05/20  [id number]     History Chief Complaint  Patient presents with   Chest Pain    Tina Holder is a 35 y.o. female.  Patient status post upper endoscopy by Dr. Abbey Chatters on August 30.  It was done for epigastric abdominal pain felt to be related to reflux.  During that procedure he did some biopsies of the esophagus and the gastric antrum.  Also saw evidence of a mild Schatzki's ring.  And did do some dilatation.  Patient states that she is got worse epigastric abdominal pain radiates up into her lower part of her chest.  No nausea no vomiting.  No fevers.  No trouble breathing.      Past Medical History:  Diagnosis Date   Headache(784.0)    OTC med prn   Obesity    Pregnancy induced hypertension 2009, 2017   resolved after pregnancy   SVD (spontaneous vaginal delivery)    x 4    Patient Active Problem List   Diagnosis Date Noted   Migraine without aura and with status migrainosus, not intractable 09/08/2019   Migraine without aura and without status migrainosus, not intractable 05/12/2019   Muscle spasm 09/06/2015   GERD (gastroesophageal reflux disease) 11/25/2011   Asthma 09/22/2011   Migraine headache 06/10/2011   ALLERGIC RHINITIS CAUSE UNSPECIFIED 06/01/2010    Past Surgical History:  Procedure Laterality Date   BALLOON DILATION N/A 11/26/2020   Procedure: BALLOON DILATION;  Surgeon: Eloise Harman, DO;  [facility];  Service: Endoscopy;  Laterality: N/A;   BIOPSY  11/26/2020   Procedure: BIOPSY;  Surgeon: Eloise Harman, DO;  Location: [facility];  Service: Endoscopy;;   ESOPHAGOGASTRODUODENOSCOPY (EGD) WITH PROPOFOL N/A 11/26/2020   Procedure: ESOPHAGOGASTRODUODENOSCOPY (EGD) WITH PROPOFOL;  Surgeon: Eloise Harman, DO;  Location: [facility];  Service: Endoscopy;  Laterality: N/A;  8:30am   LAPAROSCOPY N/A 04/15/2018   Procedure: DIAGNOSTIC LAPAROSCOPY  WITH EVACUATION OF HEMATOMA;  Surgeon: [name], MD;  Location: [facility];  Service: Gynecology;  Laterality: N/A;   REPAIR VAGINAL CUFF N/A 04/15/2018   Procedure: REPAIR VAGINAL CUFF;  Surgeon: [name], MD;  Location: [facility];  Service: Gynecology;  Laterality: N/A;   TONSILLECTOMY  2005   TUBAL LIGATION  01/01/2012   Procedure: POST PARTUM TUBAL LIGATION;  Surgeon: Lavonia Drafts, MD;  Location: [facility];  Service: Gynecology;  Laterality: Bilateral;  with filshie clips   VAGINAL HYSTERECTOMY Right 04/12/2018   Procedure: HYSTERECTOMY VAGINAL WITH RIGHT SALPINGECTOMY;  Surgeon: [name], MD;  Location: [facility];  Service: Gynecology;  Laterality: Right;   WISDOM TOOTH EXTRACTION       OB History     Gravida  4   Para  4   Term  4   Preterm  0   AB  0   Living  4      SAB  0   IAB  0   Ectopic  0   Multiple  0   Live Births  4           Family History  Problem Relation Age of Onset   Arthritis Mother    Thyroid disease Maternal Grandmother    Lupus Maternal Grandmother     Social History   Tobacco Use   Smoking status: Never   Smokeless tobacco: Never  Vaping Use   Vaping Use: Never  used  Substance Use Topics   Alcohol use: No   Drug use: No    Home Medications Prior to Admission medications   Medication Sig Start Date End Date Taking? Authorizing Provider  sucralfate (CARAFATE) 1 GM/10ML suspension Take 10 mLs (1 g total) by mouth 4 (four) times daily -  with meals and at bedtime. 12/05/20  Yes Fredia Sorrow, MD  calcium carbonate (TUMS - DOSED IN MG ELEMENTAL CALCIUM) 500 MG chewable tablet Chew 1 tablet by mouth daily.    [provider]  cyclobenzaprine (FLEXERIL) 10 MG tablet TAKE ONE TABLET BY MOUTH EVERY 8 HOURS AS NEEDED FOR MUSCLE SPASMS Patient taking differently: Take 10 mg by mouth at bedtime as needed for muscle spasms. 10/18/20   Jaclyn Prime, Collene Leyden, PA-C  eletriptan (RELPAX) 40 MG tablet Take 1 tablet (40  mg total) by mouth as needed for migraine or headache. May repeat in 2 hours if headache persists or recurs. 10/14/20   Teague Carlis Abbott, Collene Leyden, PA-C  EMGALITY 120 MG/ML SOAJ Inject 240 mg into the skin as directed AND 120 mg every 30 (thirty) days. Inj '240mg'$  once then '120mg'$  monthly. 10/10/20   Jaclyn Prime, Collene Leyden, PA-C  metoCLOPramide (REGLAN) 10 MG tablet Take 1 tablet (10 mg total) by mouth every 6 (six) hours as needed for nausea. 03/01/20   Teague Carlis Abbott, Collene Leyden, PA-C  pantoprazole (PROTONIX) 40 MG tablet Take 40 mg by mouth 2 (two) times daily. 10/07/20   [provider]  promethazine (PHENERGAN) 25 MG tablet Take 1 tablet (25 mg total) by mouth every 6 (six) hours as needed for nausea (and headache). 11/05/20   Paticia Stack, PA-C    Allergies    Patient has no known allergies.  Review of Systems   Review of Systems  Constitutional:  Negative for chills and fever.  HENT:  Negative for ear pain and sore throat.   Eyes:  Negative for pain and visual disturbance.  Respiratory:  Negative for cough and shortness of breath.   Cardiovascular:  Positive for chest pain. Negative for palpitations.  Gastrointestinal:  Positive for abdominal pain. Negative for vomiting.  Genitourinary:  Negative for dysuria and hematuria.  Musculoskeletal:  Negative for arthralgias and back pain.  Skin:  Negative for color change and rash.  Neurological:  Negative for seizures and syncope.  All other systems reviewed and are negative.  Physical Exam Updated Vital Signs BP 114/74   Pulse (!) 57   Temp 97.6 F (36.4 C) (Oral)   Resp 20   Ht 1.575 m ('5\' 2"'$ )   Wt 90.7 kg   LMP 03/14/2018 (Exact Date)   SpO2 97%   BMI 36.58 kg/m   Physical Exam Vitals and nursing note reviewed.  Constitutional:      General: She is not in acute distress.    Appearance: Normal appearance. She is well-developed.  HENT:     Head: Normocephalic and atraumatic.  Eyes:     Extraocular Movements: Extraocular  movements intact.     Conjunctiva/sclera: Conjunctivae normal.     Pupils: Pupils are equal, round, and reactive to light.  Cardiovascular:     Rate and Rhythm: Normal rate and regular rhythm.     Heart sounds: No murmur heard. Pulmonary:     Effort: Pulmonary effort is normal. No respiratory distress.     Breath sounds: Normal breath sounds.  Abdominal:     General: There is no distension.     Palpations: Abdomen  is soft.     Tenderness: There is abdominal tenderness.     Comments: Mild tenderness epigastric area no guarding  Musculoskeletal:     Cervical back: Neck supple.     Right lower leg: No edema.     Left lower leg: No edema.  Skin:    General: Skin is warm and dry.  Neurological:     General: No focal deficit present.     Mental Status: She is alert and oriented to person, place, and time.     Cranial Nerves: No cranial nerve deficit.     Sensory: No sensory deficit.     Motor: No weakness.    ED Results / Procedures / Treatments   Labs (all labs ordered are listed, but only abnormal results are displayed) Labs Reviewed  COMPREHENSIVE METABOLIC PANEL - Abnormal; Notable for the following components:      Result Value   Total Protein 6.4 (*)    All other components within normal limits  LIPASE, BLOOD  CBC WITH DIFFERENTIAL/PLATELET  I-STAT BETA HCG BLOOD, ED ([facility], [facility], [facility] ONLY)  TROPONIN I (HIGH SENSITIVITY)  TROPONIN I (HIGH SENSITIVITY)    EKG EKG Interpretation  Date/Time:  Thursday December 05 2020 10:08:47 EDT Ventricular Rate:  69 PR Interval:  115 QRS Duration: 85 QT Interval:  383 QTC Calculation: 411 R Axis:   79 Text Interpretation: Sinus rhythm Borderline short PR interval Confirmed by Fredia Sorrow [phone]) on 12/05/2020 10:28:00 AM  Radiology CT CHEST ABDOMEN PELVIS W CONTRAST  Result Date: 12/05/2020 CLINICAL DATA:  Status post EGD 11/26/2020 with increased epigastric and substernal chest pain. EXAM: CT CHEST, ABDOMEN, AND PELVIS WITH  CONTRAST TECHNIQUE: Multidetector CT imaging of the chest, abdomen and pelvis was performed following the standard protocol during bolus administration of intravenous contrast. CONTRAST:  43m OMNIPAQUE IOHEXOL 350 MG/ML SOLN COMPARISON:  Chest radiograph, earlier same day. CT abdomen pelvis, 04/14/2018. FINDINGS: CT CHEST FINDINGS Cardiovascular: No significant vascular findings. Normal heart size. No pericardial effusion. Mediastinum/Nodes: No enlarged mediastinal, hilar, or axillary lymph nodes. Thyroid gland, trachea, and esophagus demonstrate no significant findings. Lungs/Pleura: Lungs are clear. Trace bibasilar atelectasis. No pleural effusion or pneumothorax. Musculoskeletal: No chest wall mass or suspicious bone lesions identified. CT ABDOMEN PELVIS FINDINGS Hepatobiliary: No focal liver abnormality is seen. No gallstones, gallbladder wall thickening, or biliary dilatation. Pancreas: Unremarkable. No pancreatic ductal dilatation or surrounding inflammatory changes. Spleen: Normal in size without focal abnormality. Adrenals/Urinary Tract: Adrenal glands are unremarkable. Kidneys are normal, without renal calculi, focal lesion, or hydronephrosis. Bladder is unremarkable. Stomach/Bowel: Stomach is within normal limits. Appendix appears normal. No evidence of bowel wall thickening, distention, or inflammatory changes. Vascular/Lymphatic: No significant vascular findings are present. No enlarged abdominal or pelvic lymph nodes. Reproductive: Status post hysterectomy. Dropped tubal ligation clip. Pelvic phleboliths. No adnexal masses. Other: No abdominal wall hernia or abnormality. No abdominopelvic ascites. Musculoskeletal: No acute or significant osseous findings. IMPRESSION: No acute findings within the chest, abdomen or pelvis. Electronically Signed   By: JMichaelle BirksM.D.   On: 12/05/2020 14:20   DG Chest Port 1 View  Result Date: 12/05/2020 CLINICAL DATA:  epigastric and chest pain S/P EGD 8/30 EXAM:  PORTABLE CHEST 1 VIEW COMPARISON:  None. FINDINGS: The cardiomediastinal silhouette is within normal limits. No pleural effusion. No pneumothorax. No mass or consolidation. No acute osseous abnormality. IMPRESSION: No acute abnormality in the chest. Electronically Signed   By: YAlbin FellingM.D.   On: 12/05/2020 11:26  Procedures Procedures   Medications Ordered in ED Medications  iohexol (OMNIPAQUE) 350 MG/ML injection 85 mL (85 mLs Intravenous Contrast Given 12/05/20 1335)    ED Course  I have reviewed the triage vital signs and the nursing notes.  Pertinent labs & imaging results that were available during my care of the patient were reviewed by me and considered in my medical decision making (see chart for details).    MDM Rules/Calculators/A&P                           Work-up for the chest pain without any acute findings.  Troponins are negative.  EKG without any significant abnormalities.  Chest x-ray was negative which was reassuring.  Proceeded to do CT chest CT abdomen to rule out any complications.  Also discussed with Dr. Abbey Chatters.  CTs were negative.  Dr. Eulas Post recommended Carafate.  Follow-up in the office.  Patient nontoxic no acute distress.  He also showed no abnormalities with the gallbladder.  Patient's labs without any significant abnormalities complete metabolic panel was normal.  Lipase was normal white blood cell count was 4 hemoglobin 14.   Final Clinical Impression(s) / ED Diagnoses Final diagnoses:  Epigastric pain    Rx / DC Orders ED Discharge Orders          Ordered    sucralfate (CARAFATE) 1 GM/10ML suspension  3 times daily with meals & bedtime        12/05/20 1615             Fredia Sorrow, MD 12/05/20 1618    Fredia Sorrow, MD 12/05/20 [phone]

## 2020-12-09 ENCOUNTER — Telehealth: Payer: Self-pay | Admitting: Internal Medicine

## 2020-12-09 DIAGNOSIS — R1013 Epigastric pain: Secondary | ICD-10-CM

## 2020-12-09 DIAGNOSIS — R1011 Right upper quadrant pain: Secondary | ICD-10-CM

## 2020-12-09 MED ORDER — ESOMEPRAZOLE MAGNESIUM 20 MG PO CPDR: 20.0000 mg | DELAYED_RELEASE_CAPSULE | Freq: Two times a day (BID) | ORAL | 5 refills | Status: DC

## 2020-12-09 NOTE — Telephone Encounter (Signed)
Tina Holder  P [facility] Clinical Pool (supporting Mahala Menghini, PA-C) 1 hour ago (9:55 AM)   [name] there went to [employer] on Thursday, started cerafate that night. But still having pain in center stomach going up into chest pain mainly right side. Some nausea, feels like anxiety breathing, worse after eating and at bedtime, and stomach hit and miss upset . Any advice?      You  Hurshel Keys K, DO 4 days ago  Pt contacted [place] again regarding her pain. Look in the pt advise column because the pt needs advise on what to do next

## 2020-12-09 NOTE — Telephone Encounter (Signed)
Called and spoke with patient.  She continues to have epigastric and right upper quadrant pain since her endoscopy.  Pantoprazole Carafate not helping.  She has had a CT chest abdomen pelvis which was unremarkable.  I will change her pantoprazole to Nexium to see if this helps more.  Can we also order a HIDA scan to further evaluate her gallbladder?  Thank you

## 2020-12-09 NOTE — Addendum Note (Signed)
Addended by: Hassan Rowan on: 12/09/2020 02:14 PM   Modules accepted: Orders

## 2020-12-09 NOTE — Telephone Encounter (Signed)
PA for HIDA scan submitted via [facility] website. Case pending. Ref# [id number].  MyChart message sent to pt to inform her PA has been submitted and once approved can schedule appt.

## 2020-12-11 ENCOUNTER — Telehealth: Payer: Self-pay | Admitting: Radiology

## 2020-12-11 NOTE — Telephone Encounter (Signed)
Left voicemail to call [facility] to schedule headache f/u in November with Allie Dimmer

## 2020-12-16 ENCOUNTER — Telehealth: Payer: Self-pay | Admitting: Internal Medicine

## 2020-12-16 NOTE — Telephone Encounter (Signed)
HIDA approved. PA# [id number], expires 02/07/21. Nuc Med has closed for today. Message sent to pt to inform her approval has been received and will schedule appt tomorrow.

## 2020-12-16 NOTE — Telephone Encounter (Signed)
Pt called asking about her PA for her imaging she has scheduled. [phone]

## 2020-12-16 NOTE — Telephone Encounter (Signed)
PA pending. MyChart message sent to pt.

## 2020-12-17 NOTE — Telephone Encounter (Signed)
HIDA scheduled for 12/24/20 at 8:00am, arrive at 7:30am. NPO after midnight and no pain med.   MyChart message sent to pt to inform her of appt. Letter mailed.

## 2020-12-23 ENCOUNTER — Encounter (HOSPITAL_COMMUNITY): Payer: Self-pay

## 2020-12-23 ENCOUNTER — Encounter (HOSPITAL_COMMUNITY)
Admission: RE | Admit: 2020-12-23 | Discharge: 2020-12-23 | Disposition: A | Discharge status: Home / Self Care | Payer: Medicaid Other | Source: Ambulatory Visit | Attending: Internal Medicine | Admitting: Internal Medicine

## 2020-12-23 ENCOUNTER — Other Ambulatory Visit: Payer: Self-pay

## 2020-12-23 DIAGNOSIS — R1011 Right upper quadrant pain: Secondary | ICD-10-CM | POA: Insufficient documentation

## 2020-12-23 DIAGNOSIS — R1013 Epigastric pain: Secondary | ICD-10-CM | POA: Insufficient documentation

## 2020-12-23 MED ORDER — TECHNETIUM TC 99M MEBROFENIN IV KIT
5.0000 | PACK | Freq: Once | INTRAVENOUS | Status: AC | PRN
  Administered 2020-12-23: 4.98 via INTRAVENOUS

## 2020-12-24 ENCOUNTER — Other Ambulatory Visit: Payer: Self-pay | Admitting: Gastroenterology

## 2020-12-24 ENCOUNTER — Other Ambulatory Visit (HOSPITAL_COMMUNITY): Payer: Medicaid Other

## 2020-12-24 DIAGNOSIS — R110 Nausea: Secondary | ICD-10-CM

## 2020-12-24 DIAGNOSIS — R1010 Upper abdominal pain, unspecified: Secondary | ICD-10-CM

## 2020-12-24 DIAGNOSIS — K219 Gastro-esophageal reflux disease without esophagitis: Secondary | ICD-10-CM

## 2020-12-24 DIAGNOSIS — R6881 Early satiety: Secondary | ICD-10-CM

## 2020-12-24 MED ORDER — ESOMEPRAZOLE MAGNESIUM 40 MG PO CPDR: 40.0000 mg | DELAYED_RELEASE_CAPSULE | Freq: Two times a day (BID) | ORAL | 3 refills | Status: DC

## 2020-12-24 MED ORDER — ONDANSETRON HCL 4 MG PO TABS: 4.0000 mg | ORAL_TABLET | Freq: Three times a day (TID) | ORAL | 0 refills | Status: AC | PRN

## 2020-12-24 NOTE — Telephone Encounter (Signed)
PA for GES submitted via [employer]. Reference# [id number].

## 2020-12-24 NOTE — Telephone Encounter (Signed)
Spoke with patient. She continues to have postprandial abdominal pain, primarily right-sided but also upper abdomen and periumbilically.  Reports early satiety, the more she eats, and more pain she will have.  She does fine with protein shakes.  Denies any symptoms with HIDA scan.  Also with ongoing heartburn every other day and nausea.  Single episode of emesis in the setting of a migraine.  She is currently taking Nexium 20 mg twice daily which will started 2 weeks ago and is on her fourth week of Carafate with no significant improvement.  She is not taking Reglan.  Reports she had a prescription for this previously but denies history of gastroparesis.  Recommended gastric emptying study, increasing Nexium to 40 mg twice daily, and Zofran as needed. Rx's will be sent to patient's pharmacy.  Further recommendations per Dr. Abbey Chatters when he returns.  [facility] clinical pool: Please arrange gastric emptying study. Dx: Early satiety, nausea, upper abdominal pain.

## 2020-12-25 ENCOUNTER — Other Ambulatory Visit: Payer: Self-pay | Admitting: *Deleted

## 2020-12-25 ENCOUNTER — Other Ambulatory Visit: Payer: Self-pay | Admitting: Gastroenterology

## 2020-12-25 MED ORDER — SUCRALFATE 1 GM/10ML PO SUSP: 1.0000 g | Freq: Three times a day (TID) | ORAL | 1 refills | Status: DC

## 2020-12-25 NOTE — Telephone Encounter (Signed)
Received fax from [facility]. No PA required for GES.  GES scheduled for 01/01/21 at 8:00am, arrive at 7:45am. NPO after midnight prior to test. No stomach meds morning of test. Test can take up to 4 hours.  Mychart message sent to pt to inform her of GES appt.

## 2020-12-31 ENCOUNTER — Encounter (HOSPITAL_COMMUNITY): Payer: Self-pay

## 2020-12-31 ENCOUNTER — Encounter (HOSPITAL_COMMUNITY)
Admission: RE | Admit: 2020-12-31 | Discharge: 2020-12-31 | Disposition: A | Discharge status: Home / Self Care | Payer: Medicaid Other | Source: Ambulatory Visit | Attending: Gastroenterology | Admitting: Gastroenterology

## 2020-12-31 DIAGNOSIS — R1010 Upper abdominal pain, unspecified: Secondary | ICD-10-CM | POA: Diagnosis present

## 2020-12-31 DIAGNOSIS — R110 Nausea: Secondary | ICD-10-CM

## 2020-12-31 DIAGNOSIS — R6881 Early satiety: Secondary | ICD-10-CM

## 2020-12-31 HISTORY — DX: Unspecified asthma, uncomplicated: J45.909

## 2020-12-31 HISTORY — DX: Systemic involvement of connective tissue, unspecified: M35.9

## 2020-12-31 MED ORDER — TECHNETIUM TC 99M SULFUR COLLOID
2.0000 | Freq: Once | INTRAVENOUS | Status: AC | PRN
  Administered 2020-12-31: 2.1 via ORAL

## 2021-01-01 ENCOUNTER — Other Ambulatory Visit (HOSPITAL_COMMUNITY): Payer: Medicaid Other

## 2021-01-02 ENCOUNTER — Encounter: Payer: Self-pay | Admitting: *Deleted

## 2021-01-06 ENCOUNTER — Telehealth: Payer: Self-pay | Admitting: Internal Medicine

## 2021-01-06 DIAGNOSIS — R1010 Upper abdominal pain, unspecified: Secondary | ICD-10-CM

## 2021-01-06 NOTE — Telephone Encounter (Signed)
Patient called and said her gallbladder is flairing  up again and would like [place] to refer her to a surgeon in [place]

## 2021-01-06 NOTE — Telephone Encounter (Signed)
Pt called in to the office today regarding her gallbladder. She wants to be referred to the surgeon in [place].

## 2021-01-07 ENCOUNTER — Other Ambulatory Visit (HOSPITAL_COMMUNITY): Payer: Medicaid Other

## 2021-01-08 NOTE — Telephone Encounter (Signed)
Patient's CT did not show gallstones or gallbladder bladder wall thickening, HIDA scan was WNL.  We do not have any evidence of gallbladder dysfunction on her studies.  That being said if she would like referral to surgeon in [place] to discuss her pain further, please refer her.  Thank you

## 2021-01-08 NOTE — Addendum Note (Signed)
Addended by: Cheron Every on: 01/08/2021 08:36 AM   Modules accepted: Orders

## 2021-01-08 NOTE — Telephone Encounter (Signed)
Referral placed to [facility] surgery in [place]

## 2021-01-08 NOTE — Telephone Encounter (Signed)
Noted   Mindy please refer to Surgeon in [place] per Dr. Abbey Chatters.  Thank you

## 2021-01-09 NOTE — Telephone Encounter (Signed)
Fax sent to [facility] surgery in [place] to cancel referral. Referral faxed to [facility].

## 2021-01-09 NOTE — Telephone Encounter (Signed)
The pt phoned back this morning regarding her call from Monday not being returned. I advised her that I went ahead and sent and sent a note to Dr. Abbey Chatters so he could give the ok to refer to surgeon. I advised her of her CT results and HIDA scan results. She was also advised of the referral placed to [facility]. The pt states she is in [place] and she wants to be referred to [facility] in [place]. She also provided their # [phone]. Once referral has been placed the pt would like for you to advise her  because she has 4 little ones and she has to plan around their schedules.

## 2021-01-09 NOTE — Telephone Encounter (Signed)
Mychart message sent to pt to let her know surgery referral was sent to [facility] general surgery.

## 2021-03-14 ENCOUNTER — Other Ambulatory Visit: Payer: Self-pay

## 2021-03-14 ENCOUNTER — Encounter: Payer: Self-pay | Admitting: Physician Assistant

## 2021-03-14 ENCOUNTER — Ambulatory Visit (INDEPENDENT_AMBULATORY_CARE_PROVIDER_SITE_OTHER): Payer: Medicaid Other | Admitting: Physician Assistant

## 2021-03-14 VITALS — BP 113/74 | HR 79 | Wt 190.0 lb

## 2021-03-14 DIAGNOSIS — G43009 Migraine without aura, not intractable, without status migrainosus: Secondary | ICD-10-CM

## 2021-03-14 MED ORDER — CYCLOBENZAPRINE HCL 10 MG PO TABS: 10.0000 mg | ORAL_TABLET | Freq: Every evening | ORAL | 5 refills | Status: DC | PRN

## 2021-03-14 MED ORDER — ELETRIPTAN HYDROBROMIDE 40 MG PO TABS: 40.0000 mg | ORAL_TABLET | ORAL | 11 refills | Status: DC | PRN

## 2021-03-14 MED ORDER — AJOVY 225 MG/1.5ML ~~LOC~~ SOAJ: 675.0000 mg | SUBCUTANEOUS | 3 refills | Status: DC

## 2021-03-14 NOTE — Progress Notes (Signed)
Patient presents for F/U on HA's.  Patient states she is doing well notes improvement. History:  Tina Holder is a 35 y.o. [phone] who presents to clinic today for headache followup.  She was having an improvement in headaches where she felt stable for some time but has been having an increase in headaches of late.  The emgality is not lasting long enough and she feels it wears off after 2-3 weeks.  She is willing to try something differen.    Past Medical History:  Diagnosis Date   Asthma    Collagen vascular disease ([place])    Headache(784.0)    OTC med prn   Obesity    Pregnancy induced hypertension 2009, 2017   resolved after pregnancy   SVD (spontaneous vaginal delivery)    x 4    Social History   Socioeconomic History   Marital status: Married    Spouse name: Not on file   Number of children: 4   Years of education: Not on file   Highest education level: Not on file  Occupational History   Not on file  Tobacco Use   Smoking status: Never   Smokeless tobacco: Never  Vaping Use   Vaping Use: Never used  Substance and Sexual Activity   Alcohol use: No   Drug use: No   Sexual activity: Not Currently    Birth control/protection: None, Surgical    Comment: tubal  Other Topics Concern   Not on file  Social History Narrative   Not on file   Social Determinants of Health   Financial Resource Strain: Not on file  Food Insecurity: Not on file  Transportation Needs: Not on file  Physical Activity: Not on file  Stress: Not on file  Social Connections: Not on file  Intimate Partner Violence: Not on file    Family History  Problem Relation Age of Onset   Arthritis Mother    Thyroid disease Maternal Grandmother    Lupus Maternal Grandmother     No Known Allergies  Current Outpatient Medications on File Prior to Visit  Medication Sig Dispense Refill   eletriptan (RELPAX) 40 MG tablet Take 1 tablet (40 mg total) by mouth as needed for migraine or headache. May  repeat in 2 hours if headache persists or recurs. 10 tablet 5   EMGALITY 120 MG/ML SOAJ Inject 240 mg into the skin as directed AND 120 mg every 30 (thirty) days. Inj 240mg  once then 120mg  monthly. 1 mL 11   esomeprazole (NEXIUM) 40 MG capsule Take 1 capsule (40 mg total) by mouth 2 (two) times daily before a meal. 60 capsule 3   metoCLOPramide (REGLAN) 10 MG tablet Take 1 tablet (10 mg total) by mouth every 6 (six) hours as needed for nausea. 30 tablet 1   ondansetron (ZOFRAN) 4 MG tablet Take 1 tablet (4 mg total) by mouth every 8 (eight) hours as needed for nausea or vomiting. 30 tablet 0   promethazine (PHENERGAN) 25 MG tablet Take 1 tablet (25 mg total) by mouth every 6 (six) hours as needed for nausea (and headache). 20 tablet 1   calcium carbonate (TUMS - DOSED IN MG ELEMENTAL CALCIUM) 500 MG chewable tablet Chew 1 tablet by mouth daily.     cyclobenzaprine (FLEXERIL) 10 MG tablet TAKE ONE TABLET BY MOUTH EVERY 8 HOURS AS NEEDED FOR MUSCLE SPASMS (Patient taking differently: Take 10 mg by mouth at bedtime as needed for muscle spasms.) 60 tablet 1   sucralfate (CARAFATE) 1  GM/10ML suspension Take 10 mLs (1 g total) by mouth 4 (four) times daily -  with meals and at bedtime. 420 mL 1   No current facility-administered medications on file prior to visit.     Review of Systems:  All pertinent positive/negative included in HPI, all other review of systems are negative   Objective:  Physical Exam BP 113/74    Pulse 79    Wt 190 lb (86.2 kg)    LMP 03/14/2018 (Exact Date)    BMI 34.75 kg/m  CONSTITUTIONAL: Well-developed, well-nourished female in no acute distress.  EYES: EOM intact ENT: Normocephalic CARDIOVASCULAR: Regular rate   RESPIRATORY: Normal rate. MUSCULOSKELETAL: Normal ROM SKIN: Warm, dry without erythema  NEUROLOGICAL: Alert, oriented, CN II-XII grossly intact, Appropriate balance PSYCH: Normal behavior, mood   Assessment & Plan:  Assessment: Frequent migraine  headache  Plan: Will trial ajovy in place of emgality.  Sample provided.   Other meds refilled as needed. Follow-up in 47months or sooner PRN  Paticia Stack, PA-C 03/14/2021 10:33 AM

## 2021-03-18 ENCOUNTER — Encounter: Payer: Self-pay | Admitting: *Deleted

## 2021-03-28 ENCOUNTER — Other Ambulatory Visit: Payer: Self-pay | Admitting: Physician Assistant

## 2021-03-28 MED ORDER — AIMOVIG 70 MG/ML ~~LOC~~ SOAJ: 70.0000 mg | SUBCUTANEOUS | 11 refills | Status: DC

## 2021-03-28 NOTE — Progress Notes (Signed)
Ajovy denied/  Will send Aimovig which is preferred.

## 2021-04-07 ENCOUNTER — Other Ambulatory Visit: Payer: Self-pay | Admitting: Physician Assistant

## 2021-04-07 ENCOUNTER — Other Ambulatory Visit: Payer: Self-pay

## 2021-04-07 ENCOUNTER — Ambulatory Visit (INDEPENDENT_AMBULATORY_CARE_PROVIDER_SITE_OTHER): Payer: Medicaid Other

## 2021-04-07 VITALS — BP 123/80 | HR 83

## 2021-04-07 DIAGNOSIS — M62838 Other muscle spasm: Secondary | ICD-10-CM | POA: Diagnosis not present

## 2021-04-07 MED ORDER — EMGALITY 120 MG/ML ~~LOC~~ SOAJ: 1.0000 mL | SUBCUTANEOUS | 11 refills | Status: DC

## 2021-04-07 MED ORDER — KETOROLAC TROMETHAMINE 60 MG/2ML IM SOLN
60.0000 mg | Freq: Once | INTRAMUSCULAR | Status: AC
  Administered 2021-04-07: 60 mg via INTRAMUSCULAR

## 2021-04-07 MED ORDER — KETOROLAC TROMETHAMINE 60 MG/2ML IM SOLN: 60.0000 mg | Freq: Once | INTRAMUSCULAR | Status: AC

## 2021-04-07 NOTE — Progress Notes (Signed)
Patient with status migraine for days with no relief from all acute therapies tried including heat/ice/eletriptan/rimegepant/promethazine/cyclobenzaprine.   Will come in to office this afternoon for IM Toradol, 60mg  x 1.  Pt states no change in medical hx, no kidney problems.    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

## 2021-04-07 NOTE — Progress Notes (Signed)
Pt presents for Toradol injection only today. Injection given in LUOQ Pt tolerated injection well. Has F/U appt this Friday.

## 2021-04-07 NOTE — Telephone Encounter (Signed)
I have called and spoken with the patient.   She has ongoing migraine for days and has already tried Relpax, Nurtec, Promethazine, Heat, Cold and Flexeril with no lasting improvement.  She is agreeable to come into the office today for an injection of 60mg  IM toradol to hopefully break this migraine status.  She is also requesting TPI/ONB.  She said she can be flexible with her schedule coming in this Friday morning.  Is there a time that would be better for that? I am agreeable to work her in just for that procedure.

## 2021-04-10 ENCOUNTER — Encounter: Payer: Self-pay | Admitting: *Deleted

## 2021-04-11 ENCOUNTER — Other Ambulatory Visit: Payer: Self-pay

## 2021-04-11 ENCOUNTER — Encounter: Payer: Self-pay | Admitting: Physician Assistant

## 2021-04-11 ENCOUNTER — Ambulatory Visit (INDEPENDENT_AMBULATORY_CARE_PROVIDER_SITE_OTHER): Payer: Medicaid Other | Admitting: Physician Assistant

## 2021-04-11 ENCOUNTER — Other Ambulatory Visit: Payer: Self-pay | Admitting: Unknown Physician Specialty

## 2021-04-11 VITALS — BP 119/78 | HR 76 | Wt 197.0 lb

## 2021-04-11 DIAGNOSIS — K1121 Acute sialoadenitis: Secondary | ICD-10-CM

## 2021-04-11 DIAGNOSIS — G43001 Migraine without aura, not intractable, with status migrainosus: Secondary | ICD-10-CM | POA: Diagnosis not present

## 2021-04-11 NOTE — Progress Notes (Signed)
Pt here for headache management. History:  Tina Holder is a 36 y.o. [phone] who presents to clinic today for nerve block/trigger point injections.  She has periodically had injections in the past with good, lasting results.  She has requested this again.  She also came to clinic 4 days ago for a Toradol injection for status migraine.  That was helpful.  However she continues to feel she needs the injections today for more lasting benefit.  Past Medical History:  Diagnosis Date   Asthma    Collagen vascular disease ([place])    Headache(784.0)    OTC med prn   Obesity    Pregnancy induced hypertension 2009, 2017   resolved after pregnancy   SVD (spontaneous vaginal delivery)    x 4    Social History   Socioeconomic History   Marital status: Married    Spouse name: Not on file   Number of children: 4   Years of education: Not on file   Highest education level: Not on file  Occupational History   Not on file  Tobacco Use   Smoking status: Never   Smokeless tobacco: Never  Vaping Use   Vaping Use: Never used  Substance and Sexual Activity   Alcohol use: No   Drug use: No   Sexual activity: Not Currently    Birth control/protection: None, Surgical    Comment: tubal  Other Topics Concern   Not on file  Social History Narrative   Not on file   Social Determinants of Health   Financial Resource Strain: Not on file  Food Insecurity: Not on file  Transportation Needs: Not on file  Physical Activity: Not on file  Stress: Not on file  Social Connections: Not on file  Intimate Partner Violence: Not on file    Family History  Problem Relation Age of Onset   Arthritis Mother    Thyroid disease Maternal Grandmother    Lupus Maternal Grandmother     No Known Allergies  Current Outpatient Medications on File Prior to Visit  Medication Sig Dispense Refill   calcium carbonate (TUMS - DOSED IN MG ELEMENTAL CALCIUM) 500 MG chewable tablet Chew 1 tablet by mouth daily.      cyclobenzaprine (FLEXERIL) 10 MG tablet Take 1 tablet (10 mg total) by mouth at bedtime as needed for muscle spasms. 30 tablet 5   eletriptan (RELPAX) 40 MG tablet Take 1 tablet (40 mg total) by mouth as needed for migraine or headache. May repeat in 2 hours if headache persists or recurs. 10 tablet 11   Erenumab-aooe (AIMOVIG) 70 MG/ML SOAJ Inject 70 mg into the skin every 30 (thirty) days. 1.12 mL 11   esomeprazole (NEXIUM) 40 MG capsule Take 1 capsule (40 mg total) by mouth 2 (two) times daily before a meal. 60 capsule 3   Galcanezumab-gnlm (EMGALITY) 120 MG/ML SOAJ Inject 1 mL into the skin every 30 (thirty) days. 1.12 mL 11   metoCLOPramide (REGLAN) 10 MG tablet Take 1 tablet (10 mg total) by mouth every 6 (six) hours as needed for nausea. 30 tablet 1   ondansetron (ZOFRAN) 4 MG tablet Take 1 tablet (4 mg total) by mouth every 8 (eight) hours as needed for nausea or vomiting. 30 tablet 0   promethazine (PHENERGAN) 25 MG tablet TAKE ONE TABLET BY MOUTH EVERY 6 HOURS AS NEEDED FOR NAUSEA AND HEADACHE 20 tablet 1   sucralfate (CARAFATE) 1 GM/10ML suspension Take 10 mLs (1 g total) by mouth 4 (four) times  daily -  with meals and at bedtime. 420 mL 1   Current Facility-Administered Medications on File Prior to Visit  Medication Dose Route Frequency Provider Last Rate Last Admin   ketorolac (TORADOL) injection 60 mg  60 mg Intramuscular Once Jaclyn Prime, Collene Leyden, PA-C         Review of Systems:  All pertinent positive/negative included in HPI, all other review of systems are negative   Objective:  Physical Exam BP 119/78    Pulse 76    Wt 197 lb (89.4 kg)    LMP 03/14/2018 (Exact Date)    BMI 36.03 kg/m   PROCEDURE:  TRIGGER POINT INJECTIONS  Procedure: Mixture of 1%  Lidocaine, marcaine and dexamethazone in a ratio of 2:2:1  injected with 1cc each site in bilateral greater Occipital Nerves, bilateral lesser occipital nerves, bilateral cervical paraspinal muscles, bilateral trapezius.   Total amount injected: 15cc.  Pt tolerated procedure well.     Assessment & Plan:  Assessment: 1. Migraine without aura and with status migrainosus, not intractable      Plan: Pt notes improvement with injections today.   Alternate ice/heat at home Other plan as with previous appointment Follow-up as scheduled  Paticia Stack, PA-C 04/11/2021 10:10 AM

## 2021-04-15 ENCOUNTER — Telehealth: Payer: Self-pay | Admitting: Gastroenterology

## 2021-04-15 NOTE — Telephone Encounter (Signed)
Please call patient, she is having an issue and she thinks it may be hemorrhoid related

## 2021-04-15 NOTE — Telephone Encounter (Signed)
Addressed in My Chart message

## 2021-04-22 ENCOUNTER — Ambulatory Visit
Admission: RE | Admit: 2021-04-22 | Discharge: 2021-04-22 | Disposition: A | Discharge status: Home / Self Care | Payer: Medicaid Other | Source: Ambulatory Visit | Attending: Unknown Physician Specialty | Admitting: Unknown Physician Specialty

## 2021-04-22 DIAGNOSIS — K1121 Acute sialoadenitis: Secondary | ICD-10-CM

## 2021-04-22 MED ORDER — IOPAMIDOL (ISOVUE-300) INJECTION 61%
75.0000 mL | Freq: Once | INTRAVENOUS | Status: AC | PRN
  Administered 2021-04-22: 75 mL via INTRAVENOUS

## 2021-05-27 ENCOUNTER — Ambulatory Visit: Payer: Medicaid Other | Admitting: Gastroenterology

## 2021-06-13 ENCOUNTER — Other Ambulatory Visit: Payer: Self-pay | Admitting: *Deleted

## 2021-06-13 MED ORDER — PROMETHAZINE HCL 25 MG PO TABS: ORAL_TABLET | ORAL | 1 refills | Status: DC

## 2021-06-16 ENCOUNTER — Telehealth: Payer: Self-pay | Admitting: Physician Assistant

## 2021-06-16 NOTE — Telephone Encounter (Signed)
Called patient to schedule appointment with headache specialist in June. She will call back when she has her calendar available to select a date and time.  ?

## 2021-06-19 ENCOUNTER — Encounter: Payer: Self-pay | Admitting: *Deleted

## 2021-06-19 ENCOUNTER — Other Ambulatory Visit: Payer: Self-pay | Admitting: *Deleted

## 2021-07-14 ENCOUNTER — Other Ambulatory Visit: Payer: Self-pay | Admitting: Gastroenterology

## 2021-07-14 DIAGNOSIS — K219 Gastro-esophageal reflux disease without esophagitis: Secondary | ICD-10-CM

## 2021-07-14 NOTE — Telephone Encounter (Signed)
Last ov 10/24/20

## 2021-08-29 ENCOUNTER — Ambulatory Visit (INDEPENDENT_AMBULATORY_CARE_PROVIDER_SITE_OTHER): Payer: Medicaid Other | Admitting: Physician Assistant

## 2021-08-29 ENCOUNTER — Encounter: Payer: Self-pay | Admitting: Physician Assistant

## 2021-08-29 VITALS — BP 118/81 | HR 80 | Wt 188.0 lb

## 2021-08-29 DIAGNOSIS — M62838 Other muscle spasm: Secondary | ICD-10-CM

## 2021-08-29 DIAGNOSIS — G43009 Migraine without aura, not intractable, without status migrainosus: Secondary | ICD-10-CM

## 2021-08-29 MED ORDER — ELETRIPTAN HYDROBROMIDE 40 MG PO TABS: 40.0000 mg | ORAL_TABLET | ORAL | 11 refills | Status: DC | PRN

## 2021-08-29 MED ORDER — CYCLOBENZAPRINE HCL 10 MG PO TABS: 10.0000 mg | ORAL_TABLET | Freq: Every evening | ORAL | 5 refills | Status: DC | PRN

## 2021-08-29 MED ORDER — BUTALBITAL-APAP-CAFFEINE 50-325-40 MG PO CAPS: 1.0000 | ORAL_CAPSULE | Freq: Four times a day (QID) | ORAL | 3 refills | Status: DC | PRN

## 2021-08-29 MED ORDER — AJOVY 225 MG/1.5ML ~~LOC~~ SOAJ
225.0000 mg | SUBCUTANEOUS | 11 refills | Status: DC
Start: 2021-08-29 — End: 2022-07-21

## 2021-08-29 MED ORDER — PROMETHAZINE HCL 25 MG PO TABS: ORAL_TABLET | ORAL | 1 refills | Status: DC

## 2021-08-29 NOTE — Progress Notes (Signed)
Patient presents for  F/U on headaches.    Pt notes improvement and states migraines are now once a wk worse at end of the month before shot.  Recently dx w/ a blood disorder. Upcoming Gallbladder surgery 09/23/21 concerned what meds she can take if HA comes on.   History:  Tina Holder is a 36 y.o. [phone] who presents to clinic today for headache eval.  She has been able to receive ajovy and is seeing good results.  She has a migraine once/week and treats with Relpax.  Due to her recently diagnosed blood disorder (unspecified as she does not remember the name) and upcoming surgery, she wanted to double check about medications.     Number of days in the last 4 weeks with:  Severe headache: 6 Moderate headache: 4 Mild headache: 4  No headache: 14   Past Medical History:  Diagnosis Date   Asthma    Collagen vascular disease ([place])    Headache(784.0)    OTC med prn   Obesity    Pregnancy induced hypertension 2009, 2017   resolved after pregnancy   SVD (spontaneous vaginal delivery)    x 4    Social History   Socioeconomic History   Marital status: Married    Spouse name: Not on file   Number of children: 4   Years of education: Not on file   Highest education level: Not on file  Occupational History   Not on file  Tobacco Use   Smoking status: Never   Smokeless tobacco: Never  Vaping Use   Vaping Use: Never used  Substance and Sexual Activity   Alcohol use: No   Drug use: No   Sexual activity: Not Currently    Birth control/protection: None, Surgical    Comment: tubal  Other Topics Concern   Not on file  Social History Narrative   Not on file   Social Determinants of Health   Financial Resource Strain: Not on file  Food Insecurity: Not on file  Transportation Needs: Not on file  Physical Activity: Not on file  Stress: Not on file  Social Connections: Not on file  Intimate Partner Violence: Not on file    Family History  Problem Relation Age of Onset    Arthritis Mother    Thyroid disease Maternal Grandmother    Lupus Maternal Grandmother     No Known Allergies  Current Outpatient Medications on File Prior to Visit  Medication Sig Dispense Refill   cyclobenzaprine (FLEXERIL) 10 MG tablet Take 1 tablet (10 mg total) by mouth at bedtime as needed for muscle spasms. 30 tablet 5   eletriptan (RELPAX) 40 MG tablet Take 1 tablet (40 mg total) by mouth as needed for migraine or headache. May repeat in 2 hours if headache persists or recurs. 10 tablet 11   esomeprazole (NEXIUM) 40 MG capsule TAKE ONE CAPSULE BY MOUTH TWICE A DAY BEFORE A MEAL 60 capsule 3   Galcanezumab-gnlm (EMGALITY) 120 MG/ML SOAJ Inject 1 mL into the skin every 30 (thirty) days. 1.12 mL 11   metoCLOPramide (REGLAN) 10 MG tablet Take 1 tablet (10 mg total) by mouth every 6 (six) hours as needed for nausea. 30 tablet 1   promethazine (PHENERGAN) 25 MG tablet TAKE ONE TABLET BY MOUTH EVERY 6 HOURS AS NEEDED FOR NAUSEA AND HEADACHE 20 tablet 1   calcium carbonate (TUMS - DOSED IN MG ELEMENTAL CALCIUM) 500 MG chewable tablet Chew 1 tablet by mouth daily.  ondansetron (ZOFRAN) 4 MG tablet Take 1 tablet (4 mg total) by mouth every 8 (eight) hours as needed for nausea or vomiting. 30 tablet 0   sucralfate (CARAFATE) 1 GM/10ML suspension Take 10 mLs (1 g total) by mouth 4 (four) times daily -  with meals and at bedtime. 420 mL 1   No current facility-administered medications on file prior to visit.     Review of Systems:  All pertinent positive/negative included in HPI, all other review of systems are negative   Objective:  Physical Exam BP 118/81   Pulse 80   Wt 188 lb (85.3 kg)   LMP 03/14/2018 (Exact Date)   BMI 34.39 kg/m  CONSTITUTIONAL: Well-developed, well-nourished female in no acute distress.  EYES: EOM intact ENT: Normocephalic CARDIOVASCULAR: Regular rate  RESPIRATORY: Normal rate. MUSCULOSKELETAL: Normal ROM SKIN: Warm, dry without erythema   NEUROLOGICAL: Alert, oriented, CN II-XII grossly intact, Appropriate balance  PSYCH: Normal behavior, mood   Assessment & Plan:  Assessment: 1. Migraine without aura and without status migrainosus, not intractable   2. Muscle spasm      Plan: Ajovy continued for prevention Relpax for acute- but check with hematology to be sure this is okay in setting of unknown blood disorder.  Do not use immediately before or after surgery Fioricet rescue  Phenergan - rescue when needed - especially around surgery Follow-up in 6-8 months or sooner PRN  Paticia Stack, PA-C 08/29/2021 8:20 AM

## 2021-09-19 ENCOUNTER — Encounter: Payer: Self-pay | Admitting: *Deleted

## 2021-12-11 ENCOUNTER — Ambulatory Visit (INDEPENDENT_AMBULATORY_CARE_PROVIDER_SITE_OTHER): Payer: Medicaid Other

## 2021-12-11 ENCOUNTER — Encounter: Payer: Self-pay | Admitting: Emergency Medicine

## 2021-12-11 ENCOUNTER — Ambulatory Visit
Admission: EM | Admit: 2021-12-11 | Discharge: 2021-12-11 | Disposition: A | Discharge status: Home / Self Care | Payer: Medicaid Other | Attending: Family Medicine | Admitting: Family Medicine

## 2021-12-11 DIAGNOSIS — K2900 Acute gastritis without bleeding: Secondary | ICD-10-CM | POA: Diagnosis present

## 2021-12-11 DIAGNOSIS — R1031 Right lower quadrant pain: Secondary | ICD-10-CM

## 2021-12-11 DIAGNOSIS — K7581 Nonalcoholic steatohepatitis (NASH): Secondary | ICD-10-CM

## 2021-12-11 DIAGNOSIS — N83201 Unspecified ovarian cyst, right side: Secondary | ICD-10-CM

## 2021-12-11 LAB — CBC WITH DIFFERENTIAL/PLATELET
Abs Immature Granulocytes: 0.01 10*3/uL (ref 0.00–0.07)
Basophils Absolute: 0 10*3/uL (ref 0.0–0.1)
Basophils Relative: 0 %
Eosinophils Absolute: 0 10*3/uL (ref 0.0–0.5)
Eosinophils Relative: 0 %
HCT: 40.4 % (ref 36.0–46.0)
Hemoglobin: 13.9 g/dL (ref 12.0–15.0)
Immature Granulocytes: 0 %
Lymphocytes Relative: 23 %
Lymphs Abs: 1.2 10*3/uL (ref 0.7–4.0)
MCH: 30.8 pg (ref 26.0–34.0)
MCHC: 34.4 g/dL (ref 30.0–36.0)
MCV: 89.4 fL (ref 80.0–100.0)
Monocytes Absolute: 0.6 10*3/uL (ref 0.1–1.0)
Monocytes Relative: 11 %
Neutro Abs: 3.4 10*3/uL (ref 1.7–7.7)
Neutrophils Relative %: 66 %
Platelets: 171 10*3/uL (ref 150–400)
RBC: 4.52 MIL/uL (ref 3.87–5.11)
RDW: 12.1 % (ref 11.5–15.5)
WBC: 5.3 10*3/uL (ref 4.0–10.5)
nRBC: 0 % (ref 0.0–0.2)

## 2021-12-11 LAB — COMPREHENSIVE METABOLIC PANEL
ALT: 19 U/L (ref 0–44)
AST: 20 U/L (ref 15–41)
Albumin: 4.5 g/dL (ref 3.5–5.0)
Alkaline Phosphatase: 61 U/L (ref 38–126)
Anion gap: 5 (ref 5–15)
BUN: 19 mg/dL (ref 6–20)
CO2: 27 mmol/L (ref 22–32)
Calcium: 9.1 mg/dL (ref 8.9–10.3)
Chloride: 106 mmol/L (ref 98–111)
Creatinine, Ser: 0.89 mg/dL (ref 0.44–1.00)
GFR, Estimated: >60 mL/min (ref >60)
Glucose, Bld: 90 mg/dL (ref 70–99)
Potassium: 4.3 mmol/L (ref 3.5–5.1)
Sodium: 138 mmol/L (ref 135–145)
Total Bilirubin: 0.2 mg/dL — ABNORMAL LOW (ref 0.3–1.2)
Total Protein: 7 g/dL (ref 6.5–8.1)

## 2021-12-11 LAB — URINALYSIS, ROUTINE W REFLEX MICROSCOPIC
Bilirubin Urine: NEGATIVE
Glucose, UA: NEGATIVE mg/dL
Hgb urine dipstick: NEGATIVE
Leukocytes,Ua: NEGATIVE
Nitrite: NEGATIVE
Protein, ur: NEGATIVE mg/dL
Specific Gravity, Urine: >1.03 — ABNORMAL HIGH (ref 1.005–1.030)
pH: 5.5 (ref 5.0–8.0)

## 2021-12-11 LAB — PREGNANCY, URINE: Preg Test, Ur: NEGATIVE

## 2021-12-11 LAB — LIPASE, BLOOD: Lipase: 45 U/L (ref 11–51)

## 2021-12-11 MED ORDER — IOHEXOL 300 MG/ML  SOLN
100.0000 mL | Freq: Once | INTRAMUSCULAR | Status: AC | PRN
  Administered 2021-12-11: 100 mL via INTRAVENOUS

## 2021-12-11 NOTE — ED Triage Notes (Signed)
Pt presents with RLQ pain off and on x 2 months. She had her gallbladder removed in July 2023 and her symptoms started shortly after that. She had a CT of her abdomen performed and she states it was negative. The pain she is feeling is near her ovaries. She denies any other symptoms

## 2021-12-11 NOTE — ED Provider Notes (Signed)
[facility]    CSN: [id number] Arrival date & time: 12/11/21  1504      History   Chief Complaint Chief Complaint  Patient presents with   Abdominal Pain    HPI Tina Holder is a 36 y.o. female.   HPI  Tina Holder presents for intermittent stabbing RLQ abdominal pain that shoots across she lower belly and into the right side of her groin. Pain started again last night while watching TV. Has been intermittent for the past 3 months and doesn't even occur once a week.  Reports she had gallbladder taken out in run. She had a hysterectomy 3 years ago. Having severe stabbing pain, rated 10/10. She still has her appendix. No dysuria, urinary frequency or urgency.  No blood urine or stool. Heat helps the pain. She took a rice sock to work for her pain. Pain worse with movement. Had similar sx when she had her gallbladder out. She ate cookies at 3PM which caused her pain to be worse.    Symptoms Nausea/Vomiting: no  Diarrhea: no  Constipation: no  Melena/BRBPR: no  Hematemesis: no  Anorexia: decreased due to pain   Fever/Chills: no  Dysuria: no  Rash: no  Wt loss: yes since gall bladder surgery   EtOH use: no  NSAIDs/ASA: no  LMP: Patient's last menstrual period was 03/14/2018 (exact date). Vaginal bleeding: no  STD risk/hx: no  Sore throat: no   Cough: no Sleep disturbance: yes  Back Pain: no Headache: yes   Past Medical History:  Diagnosis Date   Asthma    Collagen vascular disease ([place])    Headache(784.0)    OTC med prn   Obesity    Pregnancy induced hypertension 2009, 2017   resolved after pregnancy   SVD (spontaneous vaginal delivery)    x 4    Patient Active Problem List   Diagnosis Date Noted   Migraine without aura and with status migrainosus, not intractable 09/08/2019   Migraine without aura and without status migrainosus, not intractable 05/12/2019   Muscle spasm 09/06/2015   GERD (gastroesophageal reflux disease) 11/25/2011   Asthma  09/22/2011   Migraine headache 06/10/2011   ALLERGIC RHINITIS CAUSE UNSPECIFIED 06/01/2010    Past Surgical History:  Procedure Laterality Date   ABDOMINAL HYSTERECTOMY     BALLOON DILATION N/A 11/26/2020   Procedure: BALLOON DILATION;  Surgeon: Eloise Harman, DO;  [facility];  Service: Endoscopy;  Laterality: N/A;   BIOPSY  11/26/2020   Procedure: BIOPSY;  Surgeon: Eloise Harman, DO;  [facility]: [facility];  Service: Endoscopy;;   ESOPHAGOGASTRODUODENOSCOPY (EGD) WITH PROPOFOL N/A 11/26/2020   Procedure: ESOPHAGOGASTRODUODENOSCOPY (EGD) WITH PROPOFOL;  Surgeon: Eloise Harman, DO;  Location: [facility];  Service: Endoscopy;  Laterality: N/A;  8:30am   LAPAROSCOPY N/A 04/15/2018   Procedure: DIAGNOSTIC LAPAROSCOPY WITH EVACUATION OF HEMATOMA;  Surgeon: [name], MD;  Location: [facility];  Service: Gynecology;  Laterality: N/A;   REPAIR VAGINAL CUFF N/A 04/15/2018   Procedure: REPAIR VAGINAL CUFF;  Surgeon: [name], MD;  Location: [facility];  Service: Gynecology;  Laterality: N/A;   TONSILLECTOMY  2005   TUBAL LIGATION  01/01/2012   Procedure: POST PARTUM TUBAL LIGATION;  Surgeon: Lavonia Drafts, MD;  Location: [facility];  Service: Gynecology;  Laterality: Bilateral;  with filshie clips   VAGINAL HYSTERECTOMY Right 04/12/2018   Procedure: HYSTERECTOMY VAGINAL WITH RIGHT SALPINGECTOMY;  Surgeon: [name], MD;  Location: [facility];  Service:  Gynecology;  Laterality: Right;   WISDOM TOOTH EXTRACTION      OB History     Gravida  4   Para  4   Term  4   Preterm  0   AB  0   Living  4      SAB  0   IAB  0   Ectopic  0   Multiple  0   Live Births  4            Home Medications    Prior to Admission medications   Medication Sig Start Date End Date Taking? Authorizing Provider  tranexamic acid (LYSTEDA) 650 MG TABS tablet Tranexamic acid 2 tablets po every 8 hours x 5 days prn bleeding and as directed for procedures  11/02/21  Yes [provider]  Butalbital-APAP-Caffeine 50-325-40 MG capsule Take 1-2 capsules by mouth every 6 (six) hours as needed for headache. 08/29/21   Jaclyn Prime, Collene Leyden, PA-C  calcium carbonate (TUMS - DOSED IN MG ELEMENTAL CALCIUM) 500 MG chewable tablet Chew 1 tablet by mouth daily.    [provider]  colestipol (COLESTID) 1 g tablet Take 2 g by mouth 2 (two) times daily. 11/05/21   [provider]  cyclobenzaprine (FLEXERIL) 10 MG tablet Take 1 tablet (10 mg total) by mouth at bedtime as needed for muscle spasms. 08/29/21   Jaclyn Prime, Collene Leyden, PA-C  eletriptan (RELPAX) 40 MG tablet Take 1 tablet (40 mg total) by mouth as needed for migraine or headache. May repeat in 2 hours if headache persists or recurs. 08/29/21   Jaclyn Prime, Collene Leyden, PA-C  esomeprazole (NEXIUM) 40 MG capsule TAKE ONE CAPSULE BY MOUTH TWICE A DAY BEFORE A MEAL 07/14/21   Carver, Elon Alas, DO  Fremanezumab-vfrm (AJOVY) 225 MG/1.5ML SOAJ Inject 225 mg into the skin every 30 (thirty) days. 08/29/21   Teague Carlis Abbott, Collene Leyden, PA-C  Galcanezumab-gnlm (EMGALITY) 120 MG/ML SOAJ Inject 1 mL into the skin every 30 (thirty) days. 04/07/21   Jaclyn Prime, Collene Leyden, PA-C  leflunomide (ARAVA) 20 MG tablet Take 20 mg by mouth daily. 09/05/21   [provider]  metoCLOPramide (REGLAN) 10 MG tablet Take 1 tablet (10 mg total) by mouth every 6 (six) hours as needed for nausea. 03/01/20   Jaclyn Prime, Collene Leyden, PA-C  ondansetron (ZOFRAN) 4 MG tablet Take 1 tablet (4 mg total) by mouth every 8 (eight) hours as needed for nausea or vomiting. 12/24/20 12/24/21  Erenest Rasher, PA-C  promethazine (PHENERGAN) 25 MG tablet TAKE ONE TABLET BY MOUTH EVERY 6 HOURS AS NEEDED FOR NAUSEA AND HEADACHE 08/29/21   Jaclyn Prime, Collene Leyden, PA-C  sucralfate (CARAFATE) 1 GM/10ML suspension Take 10 mLs (1 g total) by mouth 4 (four) times daily -  with meals and at bedtime. 12/25/20   Erenest Rasher, PA-C  traMADol (ULTRAM) 50 MG  tablet Take 50 mg by mouth every 8 (eight) hours as needed. 09/24/21   [provider]  VENTOLIN HFA 108 (90 Base) MCG/ACT inhaler Inhale into the lungs. 11/26/21   [provider]    Family History Family History  Problem Relation Age of Onset   Arthritis Mother    Thyroid disease Maternal Grandmother    Lupus Maternal Grandmother     Social History Social History   Tobacco Use   Smoking status: Never   Smokeless tobacco: Never  Vaping Use   Vaping Use: Never used  Substance Use Topics  Alcohol use: No   Drug use: No     Allergies   Aspirin, Nsaids, Wound dressing adhesive, and Oxycodone   Review of Systems Review of Systems : :negative unless otherwise stated in HPI.      Physical Exam Triage Vital Signs ED Triage Vitals  Enc Vitals Group     BP 12/11/21 1556 116/77     Pulse Rate 12/11/21 1556 75     Resp 12/11/21 1556 16     Temp 12/11/21 1556 98.2 F (36.8 C)     Temp Source 12/11/21 1556 Oral     SpO2 12/11/21 1556 96 %     Weight --      Height --      Head Circumference --      Peak Flow --      Pain Score 12/11/21 1550 10     Pain Loc --      Pain Edu? --      Excl. in [place]? --    No data found.  Updated Vital Signs BP 116/77 (BP Location: Right Arm)   Pulse 75   Temp 98.2 F (36.8 C) (Oral)   Resp 16   LMP 03/14/2018 (Exact Date)   SpO2 96%   Visual Acuity Right Eye Distance:   Left Eye Distance:   Bilateral Distance:    Right Eye Near:   Left Eye Near:    Bilateral Near:     Physical Exam  GEN: pleasant well appearing female, in no acute distress  CV: regular rate and rhythm RESP: no increased work of breathing, clear to ascultation bilaterally ABD: Bowel sounds present. Soft, RLQ > RLQ tenderness, non-distended.  No guarding,  no rebound,  no appreciable hepatosplenomegaly, no CVA tenderness, positive McBurney's, negative Murphy MSK: no extremity edema, normal ROM  SKIN: warm, dry NEURO: alert, moves all  extremities appropriately PSYCH: Normal affect, appropriate speech and behavior   UC Treatments / Results  Labs (all labs ordered are listed, but only abnormal results are displayed) Labs Reviewed  COMPREHENSIVE METABOLIC PANEL - Abnormal; Notable for the following components:      Result Value   Total Bilirubin 0.2 (*)    All other components within normal limits  URINALYSIS, ROUTINE W REFLEX MICROSCOPIC - Abnormal; Notable for the following components:   APPearance HAZY (*)    Specific Gravity, Urine >1.030 (*)    Ketones, ur TRACE (*)    All other components within normal limits  CBC WITH DIFFERENTIAL/PLATELET  LIPASE, BLOOD  PREGNANCY, URINE    EKG   Radiology CT ABDOMEN PELVIS W CONTRAST  Result Date: 12/11/2021 CLINICAL DATA:  Intermittent right lower quadrant pain x2 months. EXAM: CT ABDOMEN AND PELVIS WITH CONTRAST TECHNIQUE: Multidetector CT imaging of the abdomen and pelvis was performed using the standard protocol following bolus administration of intravenous contrast. RADIATION DOSE REDUCTION: This exam was performed according to the departmental dose-optimization program which includes automated exposure control, adjustment of the mA and/or kV according to patient size and/or use of iterative reconstruction technique. CONTRAST:  170m OMNIPAQUE IOHEXOL 300 MG/ML  SOLN COMPARISON:  CT December 05, 2020. FINDINGS: Lower chest: No acute abnormality. Hepatobiliary: Diffuse hepatic steatosis. Gallbladder is surgically absent. No biliary ductal dilation. Pancreas: No pancreatic ductal dilation or evidence of acute inflammation. Spleen: No splenomegaly. Adrenals/Urinary Tract: Bilateral adrenal glands appear normal. No hydronephrosis. Kidneys demonstrate symmetric enhancement. Urinary bladder is unremarkable for degree of distension. Stomach/Bowel: Radiopaque enteric contrast material traverses distal loops of small bowel.  Possible wall thickening versus underdistention of the  gastric antrum. No pathologic dilation of small or large bowel. Appendix appears normal. Scattered colonic diverticulosis without findings of acute diverticulitis. Vascular/Lymphatic: No significant vascular findings are present. No enlarged abdominal or pelvic lymph nodes. Reproductive: Homogeneous hypodense 4.4 cm right adnexal cyst on image 82/2 most likely reflecting a benign/physiologic cyst and requiring no imaging follow-up. Uterus is surgically absent. Left adnexa is unremarkable Other: Trace pelvic free fluid is within physiologic normal limits. Surgical clip again seen anterior to the rectum. Musculoskeletal: No acute osseous abnormality. IMPRESSION: 1. Mild wall thickening versus underdistention of the gastric antrum may reflect gastritis. 2. Diffuse hepatic steatosis which in the setting of steatohepatitis can be a cause of abdominal pain. 3. Scattered colonic diverticulosis without findings of acute diverticulitis. 4. No follow-up imaging recommended. Note: This recommendation does not apply to premenarchal patients and to those with increased risk (genetic, family history, elevated tumor markers or other high-risk factors) of ovarian cancer. Reference: JACR 2020 Feb; 17(2):248-254 Electronically Signed   By: Dahlia Bailiff M.D.   On: 12/11/2021 17:44    Procedures Procedures (including critical care time)  Medications Ordered in UC Medications  iohexol (OMNIPAQUE) 300 MG/ML solution 100 mL (100 mLs Intravenous Contrast Given 12/11/21 1722)    Initial Impression / Assessment and Plan / UC Course  I have reviewed the triage vital signs and the nursing notes.  Pertinent labs & imaging results that were available during my care of the patient were reviewed by me and considered in my medical decision making (see chart for details).      Patient is a  36 y.o. femalewith history hysterectomy and cholecystectomy who presents after having insidious intermittent moderate to severe abdominal pain  that acutely worsened last night.  Overall, patient is well-appearing, well-hydrated, and in no acute distress.  Vital signs stable.  Tina Holder afebrile.  Exam is concerning for acute appendicitis and right upper quadrant pathology.  Obtained UA, urine pregnancy, CBC, CMP, and lipase.    She has no CVA tenderness and no personal history of kidney stones.  UA without hematuria.  She had a hysterectomy urine pregnancy test is negative.  There is no leukocytosis or anemia on CBC.  I do not think she has an infectious etiology.  There were no significant electrolyte normalities on CMP.  She has no transaminitis.  Lipase not concerning for acute pancreatitis.  On exam, she has significant right lower quadrant abdominal pain but also some pain in the right upper quadrant.  Remaining differential not limited to acute appendicitis, nephrolithiasis, ovarian cyst, bile stone in the biliary tree.  Discussed CT abdomen pelvis and patient is agreeable.  CT showed 4.4 cm right adnexal cyst, diffuse hepatic steatosis and gastritis.  The adnexal cyst is likely benign or physiologic.  Etiology did not recommend any follow-up.  There were no abnormalities with the appendix.   Patient to take over-the-counter analgesics as needed for pain.  Handout on fatty liver disease given the patient.  Advised to speak with her PCP about this.  She is to take her home acid reflux medication for gastritis.    Follow-up, return and ED precautions given.  Discussed MDM, treatment plan and plan for follow-up with patient who agrees with plan.    Final Clinical Impressions(s) / UC Diagnoses   Final diagnoses:  Steatohepatitis  Other acute gastritis, presence of bleeding unspecified  Right lower quadrant abdominal pain  Cyst of right ovary     Discharge  Instructions      You have a small cyst on the right ovary.  You have a fatty liver.  See handout for more information of fatty liver disease.  There was evidence of  gastritis/inflammation of your stomach. Take your acid reflux medication. Follow up with your primary care provider, as discussed.         ED Prescriptions   None    PDMP not reviewed this encounter.   Lyndee Hensen, DO 12/11/21 2103

## 2021-12-11 NOTE — Discharge Instructions (Addendum)
You have a small cyst on the right ovary.  You have a fatty liver.  See handout for more information of fatty liver disease.  There was evidence of gastritis/inflammation of your stomach. Take your acid reflux medication. Follow up with your primary care provider, as discussed.

## 2021-12-11 NOTE — ED Notes (Signed)
Spoke to [place] for prior auth from insurance for CT, stated this needed additional review. Tracking [id number]

## 2021-12-12 ENCOUNTER — Ambulatory Visit (INDEPENDENT_AMBULATORY_CARE_PROVIDER_SITE_OTHER): Payer: Medicaid Other | Admitting: Physician Assistant

## 2021-12-12 ENCOUNTER — Encounter: Payer: Self-pay | Admitting: Physician Assistant

## 2021-12-12 DIAGNOSIS — G43709 Chronic migraine without aura, not intractable, without status migrainosus: Secondary | ICD-10-CM

## 2021-12-12 NOTE — Progress Notes (Signed)
Patient here for headache management.  Pt states HA's have been worse these last couple of weeks  (3-4 wks).  Pt notes stressors.   Pt also dealing w/ other issues pain w/ cyst.   MIGRAINE PROCEDURE NOTE  Pt presents complaining of 2 weeks of daily moderate to severe migraine pain.  She requests nerve blocks/TPI.    O: BP 110/76   Pulse 76   Wt 182 lb (82.6 kg)   LMP 03/14/2018 (Exact Date)   BMI 33.29 kg/m   Procedure: Mixture of 1%  Lidocaine, marcaine and dexamethazone in a ratio of 2:2:1  injected with 1cc each site in bilateral greater Occipital Nerves, bilateral lesser occipital nerves, bilateral cervical paraspinal muscles, bilateral trapezius.  Total amount injected: 14cc.  Pt tolerated procedure well.    A: Chronic Migraine  Muscle spasm   P: Injections done today.  Pt noted improvement prior to completion of visit.  F/u as previously scheduled.   Pt to see Dr. Kennon Rounds in the coming weeks to address GYN concerns.

## 2021-12-18 ENCOUNTER — Other Ambulatory Visit: Payer: Self-pay | Admitting: *Deleted

## 2021-12-18 ENCOUNTER — Encounter: Payer: Self-pay | Admitting: *Deleted

## 2021-12-18 MED ORDER — BOTOX 200 UNITS IJ SOLR: 200.0000 [IU] | INTRAMUSCULAR | 2 refills | Status: DC

## 2021-12-22 ENCOUNTER — Telehealth: Payer: Self-pay | Admitting: *Deleted

## 2021-12-22 NOTE — Telephone Encounter (Signed)
Received fax from [facility] that they do not accept new Botox RX and has transferred the RX to [facility] to be filled

## 2021-12-26 ENCOUNTER — Encounter: Payer: Self-pay | Admitting: Emergency Medicine

## 2021-12-26 ENCOUNTER — Ambulatory Visit
Admission: EM | Admit: 2021-12-26 | Discharge: 2021-12-26 | Disposition: A | Discharge status: Home / Self Care | Payer: Medicaid Other | Attending: Emergency Medicine | Admitting: Emergency Medicine

## 2021-12-26 ENCOUNTER — Other Ambulatory Visit: Payer: Self-pay | Admitting: *Deleted

## 2021-12-26 DIAGNOSIS — N898 Other specified noninflammatory disorders of vagina: Secondary | ICD-10-CM | POA: Insufficient documentation

## 2021-12-26 DIAGNOSIS — N760 Acute vaginitis: Secondary | ICD-10-CM | POA: Insufficient documentation

## 2021-12-26 DIAGNOSIS — B3731 Acute candidiasis of vulva and vagina: Secondary | ICD-10-CM

## 2021-12-26 LAB — WET PREP, GENITAL
Sperm: NONE SEEN
Trich, Wet Prep: NONE SEEN
WBC, Wet Prep HPF POC: <10 (ref <10)

## 2021-12-26 MED ORDER — FLUCONAZOLE 150 MG PO TABS: ORAL_TABLET | ORAL | 0 refills | Status: DC

## 2021-12-26 MED ORDER — METRONIDAZOLE 500 MG PO TABS: 500.0000 mg | ORAL_TABLET | Freq: Two times a day (BID) | ORAL | 0 refills | Status: AC

## 2021-12-26 MED ORDER — FLUCONAZOLE 150 MG PO TABS: 150.0000 mg | ORAL_TABLET | Freq: Once | ORAL | 3 refills | Status: DC

## 2021-12-26 NOTE — ED Provider Notes (Signed)
[facility]    CSN: [id number] Arrival date & time: 12/26/21  1527      History   Chief Complaint Chief Complaint  Patient presents with   Vaginal Discharge    HPI Tina Holder is a 36 y.o. female presenting for 3-day history of thick vaginal discharge and itching.  No report of any odor and no concern for any STIs.  Denies abdominal or pelvic pain.  Believes she has a yeast infection.  Has tried OTC meds but they have not helped.  HPI  Past Medical History:  Diagnosis Date   Asthma    Collagen vascular disease ([place])    Headache(784.0)    OTC med prn   Obesity    Pregnancy induced hypertension 2009, 2017   resolved after pregnancy   SVD (spontaneous vaginal delivery)    x 4    Patient Active Problem List   Diagnosis Date Noted   Chronic migraine without aura 12/12/2021   Migraine without aura and with status migrainosus, not intractable 09/08/2019   Migraine without aura and without status migrainosus, not intractable 05/12/2019   Muscle spasm 09/06/2015   GERD (gastroesophageal reflux disease) 11/25/2011   Asthma 09/22/2011   Migraine headache 06/10/2011   ALLERGIC RHINITIS CAUSE UNSPECIFIED 06/01/2010    Past Surgical History:  Procedure Laterality Date   ABDOMINAL HYSTERECTOMY     BALLOON DILATION N/A 11/26/2020   Procedure: BALLOON DILATION;  Surgeon: Eloise Harman, DO;  [facility];  Service: Endoscopy;  Laterality: N/A;   BIOPSY  11/26/2020   Procedure: BIOPSY;  Surgeon: Eloise Harman, DO;  [facility];  Service: Endoscopy;;   ESOPHAGOGASTRODUODENOSCOPY (EGD) WITH PROPOFOL N/A 11/26/2020   Procedure: ESOPHAGOGASTRODUODENOSCOPY (EGD) WITH PROPOFOL;  Surgeon: Eloise Harman, DO;  [facility];  Service: Endoscopy;  Laterality: N/A;  8:30am   LAPAROSCOPY N/A 04/15/2018   Procedure: DIAGNOSTIC LAPAROSCOPY WITH EVACUATION OF HEMATOMA;  Surgeon: [name], MD;  Location: [facility];  Service:  Gynecology;  Laterality: N/A;   REPAIR VAGINAL CUFF N/A 04/15/2018   Procedure: REPAIR VAGINAL CUFF;  Surgeon: [name], MD;  Location: [facility];  Service: Gynecology;  Laterality: N/A;   TONSILLECTOMY  2005   TUBAL LIGATION  01/01/2012   Procedure: POST PARTUM TUBAL LIGATION;  Surgeon: Lavonia Drafts, MD;  Location: [facility];  Service: Gynecology;  Laterality: Bilateral;  with filshie clips   VAGINAL HYSTERECTOMY Right 04/12/2018   Procedure: HYSTERECTOMY VAGINAL WITH RIGHT SALPINGECTOMY;  Surgeon: [name], MD;  Location: [facility];  Service: Gynecology;  Laterality: Right;   WISDOM TOOTH EXTRACTION      OB History     Gravida  4   Para  4   Term  4   Preterm  0   AB  0   Living  4      SAB  0   IAB  0   Ectopic  0   Multiple  0   Live Births  4            Home Medications    Prior to Admission medications   Medication Sig Start Date End Date Taking? Authorizing Provider  fluconazole (DIFLUCAN) 150 MG tablet Take 1 tab p.o. every 72 hours 12/26/21  Yes Laurene Footman B, PA-C  metroNIDAZOLE (FLAGYL) 500 MG tablet Take 1 tablet (500 mg total) by mouth 2 (two) times daily for 7 days. 12/26/21 01/02/22 Yes Danton Clap, PA-C  botulinum  toxin Type A (BOTOX) 200 units injection Inject 200 Units into the muscle every 3 (three) months. 12/18/21   Jaclyn Prime, Collene Leyden, PA-C  Butalbital-APAP-Caffeine [phone] MG capsule Take 1-2 capsules by mouth every 6 (six) hours as needed for headache. 08/29/21   Jaclyn Prime, Collene Leyden, PA-C  calcium carbonate (TUMS - DOSED IN MG ELEMENTAL CALCIUM) 500 MG chewable tablet Chew 1 tablet by mouth daily.    [provider]  colestipol (COLESTID) 1 g tablet Take 2 g by mouth 2 (two) times daily. 11/05/21   [provider]  cyclobenzaprine (FLEXERIL) 10 MG tablet Take 1 tablet (10 mg total) by mouth at bedtime as needed for muscle spasms. 08/29/21   Jaclyn Prime, Collene Leyden, PA-C  eletriptan (RELPAX) 40 MG tablet Take  1 tablet (40 mg total) by mouth as needed for migraine or headache. May repeat in 2 hours if headache persists or recurs. 08/29/21   Jaclyn Prime, Collene Leyden, PA-C  esomeprazole (NEXIUM) 40 MG capsule TAKE ONE CAPSULE BY MOUTH TWICE A DAY BEFORE A MEAL 07/14/21   Carver, Elon Alas, DO  Fremanezumab-vfrm (AJOVY) 225 MG/1.5ML SOAJ Inject 225 mg into the skin every 30 (thirty) days. 08/29/21   Teague Carlis Abbott, Collene Leyden, PA-C  Galcanezumab-gnlm (EMGALITY) 120 MG/ML SOAJ Inject 1 mL into the skin every 30 (thirty) days. 04/07/21   Jaclyn Prime, Collene Leyden, PA-C  leflunomide (ARAVA) 20 MG tablet Take 20 mg by mouth daily. 09/05/21   [provider]  metoCLOPramide (REGLAN) 10 MG tablet Take 1 tablet (10 mg total) by mouth every 6 (six) hours as needed for nausea. 03/01/20   Jaclyn Prime, Collene Leyden, PA-C  promethazine (PHENERGAN) 25 MG tablet TAKE ONE TABLET BY MOUTH EVERY 6 HOURS AS NEEDED FOR NAUSEA AND HEADACHE 08/29/21   Jaclyn Prime, Collene Leyden, PA-C  sucralfate (CARAFATE) 1 GM/10ML suspension Take 10 mLs (1 g total) by mouth 4 (four) times daily -  with meals and at bedtime. 12/25/20   Erenest Rasher, PA-C  traMADol (ULTRAM) 50 MG tablet Take 50 mg by mouth every 8 (eight) hours as needed. 09/24/21   [provider]  tranexamic acid (LYSTEDA) 650 MG TABS tablet Tranexamic acid 2 tablets po every 8 hours x 5 days prn bleeding and as directed for procedures 11/02/21   [provider]  VENTOLIN HFA 108 (90 Base) MCG/ACT inhaler Inhale into the lungs. 11/26/21   [provider]    Family History Family History  Problem Relation Age of Onset   Arthritis Mother    Thyroid disease Maternal Grandmother    Lupus Maternal Grandmother     Social History Social History   Tobacco Use   Smoking status: Never   Smokeless tobacco: Never  Vaping Use   Vaping Use: Never used  Substance Use Topics   Alcohol use: No   Drug use: No     Allergies   Aspirin, Nsaids, Wound dressing adhesive, and  Oxycodone   Review of Systems Review of Systems  Constitutional:  Negative for fatigue and fever.  Gastrointestinal:  Negative for abdominal pain.  Genitourinary:  Positive for vaginal discharge. Negative for dysuria, flank pain, frequency, hematuria, urgency, vaginal bleeding and vaginal pain.  Musculoskeletal:  Negative for back pain.  Skin:  Negative for rash.     Physical Exam Triage Vital Signs ED Triage Vitals  Enc Vitals Group     BP      Pulse      Resp  Temp      Temp src      SpO2      Weight      Height      Head Circumference      Peak Flow      Pain Score      Pain Loc      Pain Edu?      Excl. in [place]?    No data found.  Updated Vital Signs BP 129/83 (BP Location: Right Arm)   Pulse (!) 103   Temp 98.2 F (36.8 C) (Oral)   Resp 14   Ht '5\' 2"'$  (1.575 m)   Wt 180 lb (81.6 kg)   LMP 03/14/2018 (Exact Date)   SpO2 98%   BMI 32.92 kg/m   Physical Exam Vitals and nursing note reviewed.  Constitutional:      General: She is not in acute distress.    Appearance: Normal appearance. She is not ill-appearing or toxic-appearing.  HENT:     Head: Normocephalic and atraumatic.  Eyes:     General: No scleral icterus.       Right eye: No discharge.        Left eye: No discharge.     Conjunctiva/sclera: Conjunctivae normal.  Cardiovascular:     Rate and Rhythm: Normal rate and regular rhythm.  Pulmonary:     Effort: Pulmonary effort is normal. No respiratory distress.  Abdominal:     Palpations: Abdomen is soft.     Tenderness: There is no abdominal tenderness. There is no right CVA tenderness or left CVA tenderness.  Musculoskeletal:     Cervical back: Neck supple.  Skin:    General: Skin is dry.  Neurological:     General: No focal deficit present.     Mental Status: She is alert. Mental status is at baseline.     Motor: No weakness.     Gait: Gait normal.  Psychiatric:        Mood and Affect: Mood normal.        Behavior: Behavior normal.         Thought Content: Thought content normal.      UC Treatments / Results  Labs (all labs ordered are listed, but only abnormal results are displayed) Labs Reviewed  WET PREP, GENITAL - Abnormal; Notable for the following components:      Result Value   Yeast Wet Prep HPF POC PRESENT (*)    Clue Cells Wet Prep HPF POC PRESENT (*)    All other components within normal limits    EKG   Radiology No results found.  Procedures Procedures (including critical care time)  Medications Ordered in UC Medications - No data to display  Initial Impression / Assessment and Plan / UC Course  I have reviewed the triage vital signs and the nursing notes.  Pertinent labs & imaging results that were available during my care of the patient were reviewed by me and considered in my medical decision making (see chart for details).   36 year old female presents for vaginal discharge and irritation for the past few days.  Believes she has a yeast infection.  Patient elects to forego pelvic exam and perform vaginal self swab.  Wet prep is positive for yeast and clue cells.  Will treat for BV and yeast infection with metronidazole and Diflucan.  Follow-up as needed.   Final Clinical Impressions(s) / UC Diagnoses   Final diagnoses:  Acute vaginitis  Vaginal discharge  Discharge Instructions      You have bacterial vaginitis and yeast infection.     ED Prescriptions     Medication Sig Dispense Auth. Provider   metroNIDAZOLE (FLAGYL) 500 MG tablet Take 1 tablet (500 mg total) by mouth 2 (two) times daily for 7 days. 14 tablet Laurene Footman B, PA-C   fluconazole (DIFLUCAN) 150 MG tablet Take 1 tab p.o. every 72 hours 3 tablet Danton Clap, PA-C      PDMP not reviewed this encounter.   Danton Clap, PA-C 12/26/21 1615

## 2021-12-26 NOTE — Discharge Instructions (Signed)
You have bacterial vaginitis and yeast infection.

## 2021-12-26 NOTE — Telephone Encounter (Signed)
Pt called requesting refill for yeast infection

## 2021-12-26 NOTE — ED Triage Notes (Signed)
Patient vaginal discharge and itching for the past 3 days.  Patient not concern about STDs.

## 2021-12-29 ENCOUNTER — Other Ambulatory Visit: Payer: Self-pay | Admitting: *Deleted

## 2021-12-29 MED ORDER — BOTOX 200 UNITS IJ SOLR: 200.0000 [IU] | INTRAMUSCULAR | 2 refills | Status: DC

## 2021-12-29 NOTE — Progress Notes (Signed)
Sending to correct speciality pharmacy

## 2021-12-31 ENCOUNTER — Encounter: Payer: Self-pay | Admitting: Family Medicine

## 2022-01-07 ENCOUNTER — Encounter: Payer: Self-pay | Admitting: Family Medicine

## 2022-01-07 ENCOUNTER — Other Ambulatory Visit (HOSPITAL_COMMUNITY)
Admission: RE | Admit: 2022-01-07 | Discharge: 2022-01-07 | Disposition: A | Discharge status: Home / Self Care | Payer: Medicaid Other | Source: Ambulatory Visit | Attending: Family Medicine | Admitting: Family Medicine

## 2022-01-07 ENCOUNTER — Ambulatory Visit (INDEPENDENT_AMBULATORY_CARE_PROVIDER_SITE_OTHER): Payer: Medicaid Other | Admitting: Family Medicine

## 2022-01-07 VITALS — BP 115/79 | HR 69 | Wt 182.0 lb

## 2022-01-07 DIAGNOSIS — N898 Other specified noninflammatory disorders of vagina: Secondary | ICD-10-CM | POA: Insufficient documentation

## 2022-01-07 DIAGNOSIS — N83201 Unspecified ovarian cyst, right side: Secondary | ICD-10-CM

## 2022-01-07 NOTE — Progress Notes (Signed)
RGYN pt here for problem visit has been dealing w/ cyst x 1 yr now on/off now   Went to ED due to pain.   Pain will be 10/10x  HYST x 4 yrs ago.   Pt wants to be sure yeast infection is gone had +yeat and clue cells on 12/26/21   BV and yeast only declines STD Screening.   Pt will do self swab.   Pt had CT done on 12/11/21.

## 2022-01-07 NOTE — Progress Notes (Signed)
   Subjective:    Patient ID: Tina Holder is a 36 y.o. female presenting with Pelvic Pain  on 01/07/2022  HPI: H/o SVD x 4 and s/p TVH 4 years ago. Has had recurrent pain and noted to have right ovarian cyst. She cannot take COCs due to migraine hx. Her last CT showed 4.4 cm right sided cyst. Had recent cholecystectomy.  Seen for vaginal discharge and treated for yeast and BV--wants TOC.  Review of Systems  Constitutional:  Negative for chills and fever.  Respiratory:  Negative for shortness of breath.   Cardiovascular:  Negative for chest pain.  Gastrointestinal:  Negative for abdominal pain, nausea and vomiting.  Genitourinary:  Negative for dysuria.  Skin:  Negative for rash.      Objective:    BP 115/79   Pulse 69   Wt 182 lb (82.6 kg)   LMP 03/14/2018 (Exact Date)   BMI 33.29 kg/m  Physical Exam Exam conducted with a chaperone present.  Constitutional:      General: She is not in acute distress.    Appearance: She is well-developed.  HENT:     Head: Normocephalic and atraumatic.  Eyes:     General: No scleral icterus. Cardiovascular:     Rate and Rhythm: Normal rate.  Pulmonary:     Effort: Pulmonary effort is normal.  Abdominal:     Palpations: Abdomen is soft.  Musculoskeletal:     Cervical back: Neck supple.  Skin:    General: Skin is warm and dry.  Neurological:     Mental Status: She is alert and oriented to person, place, and time.         Assessment & Plan:  Cyst of right ovary - check pelvic sonogram. Consider daily Prog to keep ovaries quiet--Slynd of the like. - Plan: US PELVIC COMPLETE WITH TRANSVAGINAL  Vaginal discharge - wet prep and manage accordingly - Plan: Cervicovaginal ancillary only( [facility])    Return in about 4 weeks (around 02/04/2022).  [name], MD 01/07/2022 4:44 PM

## 2022-01-08 ENCOUNTER — Other Ambulatory Visit: Payer: Self-pay | Admitting: *Deleted

## 2022-01-08 MED ORDER — NURTEC 75 MG PO TBDP: 75.0000 mg | ORAL_TABLET | ORAL | 10 refills | Status: DC

## 2022-01-09 LAB — CERVICOVAGINAL ANCILLARY ONLY
Bacterial Vaginitis (gardnerella): NEGATIVE
Candida Glabrata: NEGATIVE
Candida Vaginitis: NEGATIVE
Comment: NEGATIVE
Comment: NEGATIVE
Comment: NEGATIVE

## 2022-01-12 ENCOUNTER — Encounter: Payer: Self-pay | Admitting: *Deleted

## 2022-01-12 ENCOUNTER — Telehealth: Payer: Self-pay | Admitting: Physician Assistant

## 2022-01-12 MED ORDER — METOPROLOL TARTRATE 25 MG PO TABS: 25.0000 mg | ORAL_TABLET | Freq: Two times a day (BID) | ORAL | 3 refills | Status: DC

## 2022-01-12 NOTE — Telephone Encounter (Signed)
Per insurance, Nurtec not approved.  Pt needs to trial other preventive medications.   Metoprolol rx sent.

## 2022-01-19 ENCOUNTER — Encounter: Payer: Self-pay | Admitting: *Deleted

## 2022-01-26 ENCOUNTER — Encounter: Payer: Self-pay | Admitting: Family Medicine

## 2022-01-29 ENCOUNTER — Other Ambulatory Visit: Payer: Self-pay | Admitting: Internal Medicine

## 2022-02-13 ENCOUNTER — Ambulatory Visit
Admission: RE | Admit: 2022-02-13 | Discharge: 2022-02-13 | Disposition: A | Discharge status: Home / Self Care | Payer: Medicaid Other | Source: Ambulatory Visit | Attending: Emergency Medicine | Admitting: Emergency Medicine

## 2022-02-13 VITALS — BP 128/82 | HR 92 | Temp 98.8°F | Resp 17 | Ht 62.0 in | Wt 180.0 lb

## 2022-02-13 DIAGNOSIS — R591 Generalized enlarged lymph nodes: Secondary | ICD-10-CM

## 2022-02-13 DIAGNOSIS — H9201 Otalgia, right ear: Secondary | ICD-10-CM | POA: Diagnosis not present

## 2022-02-13 MED ORDER — AMOXICILLIN 875 MG PO TABS: 875.0000 mg | ORAL_TABLET | Freq: Two times a day (BID) | ORAL | 0 refills | Status: AC

## 2022-02-13 MED ORDER — FLUCONAZOLE 150 MG PO TABS: ORAL_TABLET | ORAL | 0 refills | Status: DC

## 2022-02-13 NOTE — Discharge Instructions (Addendum)
Take the amoxicillin and Diflucan as directed.  Follow up with your primary care provider on Monday.

## 2022-02-13 NOTE — ED Triage Notes (Signed)
Patient to Urgent Care with complaints of swollen lymph node and ear pain on the right side- reports symptoms started yesterday. Patient reports initially having a URI approx 1 week ago. Denies any drainage. Reports pain worse w/ swallowing/ chewing.   Denies any recent fevers.

## 2022-02-13 NOTE — ED Provider Notes (Signed)
[facility]    CSN: [id number] Arrival date & time: 02/13/22  1605      History   Chief Complaint Chief Complaint  Patient presents with   Facial Pain    swollen lymph node right side of face neck/throat area, ear pain also, getting over head cold started Thursday - Entered by patient    HPI Tina Holder is a 36 y.o. female.  Patient presents with day history of right ear pain and enlarged painful lymph node on her right neck.  She had congestion and cough a week ago but these have improved.  She denies fever, rash, sores, difficulty breathing, or other symptoms.  Her medical history includes asthma, allergies, migraine headaches.  Patient attempted to get an appointment with her ENT but states no appointment available for 4 weeks.  The history is provided by the patient and medical records.    Past Medical History:  Diagnosis Date   Asthma    Collagen vascular disease ([place])    Headache(784.0)    OTC med prn   Obesity    Pregnancy induced hypertension 2009, 2017   resolved after pregnancy   SVD (spontaneous vaginal delivery)    x 4    Patient Active Problem List   Diagnosis Date Noted   Chronic migraine without aura 12/12/2021   Migraine without aura and with status migrainosus, not intractable 09/08/2019   Migraine without aura and without status migrainosus, not intractable 05/12/2019   Muscle spasm 09/06/2015   GERD (gastroesophageal reflux disease) 11/25/2011   Asthma 09/22/2011   Migraine headache 06/10/2011   ALLERGIC RHINITIS CAUSE UNSPECIFIED 06/01/2010    Past Surgical History:  Procedure Laterality Date   ABDOMINAL HYSTERECTOMY     BALLOON DILATION N/A 11/26/2020   Procedure: BALLOON DILATION;  Surgeon: Eloise Harman, DO;  [facility];  Service: Endoscopy;  Laterality: N/A;   BIOPSY  11/26/2020   Procedure: BIOPSY;  Surgeon: Eloise Harman, DO;  [facility];  Service: Endoscopy;;   ESOPHAGOGASTRODUODENOSCOPY  (EGD) WITH PROPOFOL N/A 11/26/2020   Procedure: ESOPHAGOGASTRODUODENOSCOPY (EGD) WITH PROPOFOL;  Surgeon: Eloise Harman, DO;  [facility];  Service: Endoscopy;  Laterality: N/A;  8:30am   LAPAROSCOPY N/A 04/15/2018   Procedure: DIAGNOSTIC LAPAROSCOPY WITH EVACUATION OF HEMATOMA;  Surgeon: [name], MD;  Location: [facility];  Service: Gynecology;  Laterality: N/A;   REPAIR VAGINAL CUFF N/A 04/15/2018   Procedure: REPAIR VAGINAL CUFF;  Surgeon: [name], MD;  Location: [facility];  Service: Gynecology;  Laterality: N/A;   TONSILLECTOMY  2005   TUBAL LIGATION  01/01/2012   Procedure: POST PARTUM TUBAL LIGATION;  Surgeon: Lavonia Drafts, MD;  Location: [facility];  Service: Gynecology;  Laterality: Bilateral;  with filshie clips   VAGINAL HYSTERECTOMY Right 04/12/2018   Procedure: HYSTERECTOMY VAGINAL WITH RIGHT SALPINGECTOMY;  Surgeon: [name], MD;  Location: [facility];  Service: Gynecology;  Laterality: Right;   WISDOM TOOTH EXTRACTION      OB History     Gravida  4   Para  4   Term  4   Preterm  0   AB  0   Living  4      SAB  0   IAB  0   Ectopic  0   Multiple  0   Live Births  4            Home Medications    Prior to Admission medications  Medication Sig Start Date End Date Taking? Authorizing Provider  amoxicillin (AMOXIL) 875 MG tablet Take 1 tablet (875 mg total) by mouth 2 (two) times daily for 7 days. 02/13/22 02/20/22 Yes Sharion Balloon, NP  fluconazole (DIFLUCAN) 150 MG tablet Take as directed. 02/13/22  Yes Sharion Balloon, NP  botulinum toxin Type A (BOTOX) 200 units injection Inject 200 Units into the muscle every 3 (three) months. 12/29/21   Teague Carlis Abbott, Collene Leyden, PA-C  Butalbital-APAP-Caffeine [phone] MG capsule Take 1-2 capsules by mouth every 6 (six) hours as needed for headache. 08/29/21   Jaclyn Prime, Collene Leyden, PA-C  calcium carbonate (TUMS - DOSED IN MG ELEMENTAL CALCIUM) 500 MG chewable tablet Chew 1 tablet by mouth  daily.    [provider]  colestipol (COLESTID) 1 g tablet Take 2 g by mouth 2 (two) times daily. 11/05/21   [provider]  cyclobenzaprine (FLEXERIL) 10 MG tablet Take 1 tablet (10 mg total) by mouth at bedtime as needed for muscle spasms. 08/29/21   Jaclyn Prime, Collene Leyden, PA-C  eletriptan (RELPAX) 40 MG tablet Take 1 tablet (40 mg total) by mouth as needed for migraine or headache. May repeat in 2 hours if headache persists or recurs. 08/29/21   Jaclyn Prime, Collene Leyden, PA-C  esomeprazole (NEXIUM) 20 MG capsule TAKE ONE CAPSULE BY MOUTH TWICE A DAY BEFORE A MEAL 01/29/22   Carver, Elon Alas, DO  Fremanezumab-vfrm (AJOVY) 225 MG/1.5ML SOAJ Inject 225 mg into the skin every 30 (thirty) days. 08/29/21   Teague Carlis Abbott, Collene Leyden, PA-C  Galcanezumab-gnlm (EMGALITY) 120 MG/ML SOAJ Inject 1 mL into the skin every 30 (thirty) days. 04/07/21   Jaclyn Prime, Collene Leyden, PA-C  leflunomide (ARAVA) 20 MG tablet Take 20 mg by mouth daily. 09/05/21   [provider]  metoCLOPramide (REGLAN) 10 MG tablet Take 1 tablet (10 mg total) by mouth every 6 (six) hours as needed for nausea. 03/01/20   Jaclyn Prime, Collene Leyden, PA-C  metoprolol tartrate (LOPRESSOR) 25 MG tablet Take 1 tablet (25 mg total) by mouth 2 (two) times daily. 01/12/22   Jaclyn Prime, Collene Leyden, PA-C  promethazine (PHENERGAN) 25 MG tablet TAKE ONE TABLET BY MOUTH EVERY 6 HOURS AS NEEDED FOR NAUSEA AND HEADACHE 08/29/21   Jaclyn Prime, Collene Leyden, PA-C  Rimegepant Sulfate (NURTEC) 75 MG TBDP Take 75 mg by mouth every other day. 01/08/22   Jaclyn Prime, Collene Leyden, PA-C  sucralfate (CARAFATE) 1 GM/10ML suspension Take 10 mLs (1 g total) by mouth 4 (four) times daily -  with meals and at bedtime. 12/25/20   Erenest Rasher, PA-C  traMADol (ULTRAM) 50 MG tablet Take 50 mg by mouth every 8 (eight) hours as needed. 09/24/21   [provider]  tranexamic acid (LYSTEDA) 650 MG TABS tablet Tranexamic acid 2 tablets po every 8 hours x 5 days prn bleeding  and as directed for procedures 11/02/21   [provider]  VENTOLIN HFA 108 (90 Base) MCG/ACT inhaler Inhale into the lungs. 11/26/21   [provider]    Family History Family History  Problem Relation Age of Onset   Arthritis Mother    Thyroid disease Maternal Grandmother    Lupus Maternal Grandmother     Social History Social History   Tobacco Use   Smoking status: Never   Smokeless tobacco: Never  Vaping Use   Vaping Use: Never used  Substance Use Topics   Alcohol use: No   Drug use: No  Allergies   Aspirin, Nsaids, Wound dressing adhesive, and Oxycodone   Review of Systems Review of Systems  Constitutional:  Negative for chills and fever.  HENT:  Positive for ear pain. Negative for sore throat, trouble swallowing and voice change.   Respiratory:  Negative for cough and shortness of breath.   Skin:  Negative for color change, rash and wound.  All other systems reviewed and are negative.    Physical Exam Triage Vital Signs ED Triage Vitals  Enc Vitals Group     BP      Pulse      Resp      Temp      Temp src      SpO2      Weight      Height      Head Circumference      Peak Flow      Pain Score      Pain Loc      Pain Edu?      Excl. in [place]?    No data found.  Updated Vital Signs BP 128/82   Pulse 92   Temp 98.8 F (37.1 C)   Resp 17   Ht '5\' 2"'$  (1.575 m)   Wt 180 lb (81.6 kg)   LMP 03/14/2018 (Exact Date)   SpO2 98%   BMI 32.92 kg/m   Visual Acuity Right Eye Distance:   Left Eye Distance:   Bilateral Distance:    Right Eye Near:   Left Eye Near:    Bilateral Near:     Physical Exam Vitals and nursing note reviewed.  Constitutional:      General: She is not in acute distress.    Appearance: She is well-developed. She is not ill-appearing.  HENT:     Right Ear: Tympanic membrane and ear canal normal.     Left Ear: Tympanic membrane and ear canal normal.     Nose: Nose normal.     Mouth/Throat:     Mouth:  Mucous membranes are moist.     Pharynx: Oropharynx is clear.     Comments: Speech clear.  No oropharyngeal swelling.  No difficulty swallowing. Neck:     Comments: Large tender right submandibular lymph node.  Cardiovascular:     Rate and Rhythm: Normal rate and regular rhythm.     Heart sounds: Normal heart sounds.  Pulmonary:     Effort: Pulmonary effort is normal. No respiratory distress.     Breath sounds: Normal breath sounds.     Comments: Good air movement. Musculoskeletal:     Cervical back: Neck supple.  Lymphadenopathy:     Cervical: Cervical adenopathy present.  Skin:    General: Skin is warm and dry.  Neurological:     Mental Status: She is alert.  Psychiatric:        Mood and Affect: Mood normal.        Behavior: Behavior normal.      UC Treatments / Results  Labs (all labs ordered are listed, but only abnormal results are displayed) Labs Reviewed - No data to display  EKG   Radiology No results found.  Procedures Procedures (including critical care time)  Medications Ordered in UC Medications - No data to display  Initial Impression / Assessment and Plan / UC Course  I have reviewed the triage vital signs and the nursing notes.  Pertinent labs & imaging results that were available during my care of the patient were reviewed by me  and considered in my medical decision making (see chart for details).   Lymphadenopathy, acute right otalgia.  Treating with amoxicillin.  Per patient request, 1 tablet of Diflucan also prescribed as she reports she gets yeast infection when taking antibiotics.  Tylenol or ibuprofen as needed for discomfort.  Instructed patient to follow-up with her PCP or ENT on Monday.  She agrees to plan of care.   Final Clinical Impressions(s) / UC Diagnoses   Final diagnoses:  Lymphadenopathy  Acute otalgia, right     Discharge Instructions      Take the amoxicillin and Diflucan as directed.  Follow up with your primary care  provider on Monday.        ED Prescriptions     Medication Sig Dispense Auth. Provider   amoxicillin (AMOXIL) 875 MG tablet Take 1 tablet (875 mg total) by mouth 2 (two) times daily for 7 days. 14 tablet Barkley Boards H, NP   fluconazole (DIFLUCAN) 150 MG tablet Take as directed. 1 tablet Sharion Balloon, NP      PDMP not reviewed this encounter.   Sharion Balloon, NP 02/13/22 1645

## 2022-04-08 ENCOUNTER — Encounter: Payer: Self-pay | Admitting: Family Medicine

## 2022-04-09 ENCOUNTER — Other Ambulatory Visit: Payer: Self-pay | Admitting: *Deleted

## 2022-04-09 MED ORDER — METOPROLOL TARTRATE 25 MG PO TABS: 25.0000 mg | ORAL_TABLET | Freq: Two times a day (BID) | ORAL | 3 refills | Status: DC

## 2022-04-09 MED ORDER — FLUCONAZOLE 150 MG PO TABS: 150.0000 mg | ORAL_TABLET | Freq: Once | ORAL | 3 refills | Status: AC

## 2022-04-18 ENCOUNTER — Encounter: Payer: Self-pay | Admitting: Emergency Medicine

## 2022-04-18 ENCOUNTER — Ambulatory Visit
Admission: EM | Admit: 2022-04-18 | Discharge: 2022-04-18 | Disposition: A | Discharge status: Home / Self Care | Payer: Medicaid Other

## 2022-04-18 DIAGNOSIS — R1013 Epigastric pain: Secondary | ICD-10-CM

## 2022-04-18 NOTE — ED Triage Notes (Signed)
Patient c/o upper mid abdominal pain and nausea that started last night.  Patient also reports some indigestion.  Patient denies V/D.

## 2022-04-18 NOTE — ED Provider Notes (Signed)
   Presents for evaluation of constant centralized abdominal pain beginning 1 day ago.  Pain is described as a burning sensation, worse than baseline, endorses that her heartburn and indigestion has been flared up for the past 2 weeks.  Currently taking Nexium daily as well as Carafate, last dose 1 day ago, took Pepcid 1 day ago as well.  No improvement in symptoms.  Associated nausea without vomiting, able to tolerate food and liquids but food worsening symptoms.  Denies changes in diet.  Has GI referral pending placed by primary doctor.  Last bowel movement 1 day ago, described as normal, denies constipation diarrhea, mucus or blood within the stool.  History of a cholecystectomy.  Patient being sent to the nearest emergency department for further evaluation of abdominal pain.  Believes symptoms are still related to GERD flare, discussed with patient however she endorses that pain is worse and she feels this is different than her typical heartburn or indigestion and therefore she will need further evaluation and imaging, to be escorted by family as vital signs are stable, going to [facility], NP 04/18/22 1540

## 2022-04-18 NOTE — Discharge Instructions (Signed)
Please go to the nearest emergency department for further evaluation of your abdominal pain and need for imaging to determine cause

## 2022-05-08 IMAGING — CT CT NECK W/ CM
4 series · 15 of 33 positions shown, 18 images · IV contrast (agent unspecified)
Comparison: No prior CT

CLINICAL DATA: Concern for acute sialadenitis in infected lymph
nodes

EXAM:
CT NECK WITH CONTRAST
TECHNIQUE: Multidetector CT imaging of the neck was performed using the
standard protocol following the bolus administration of intravenous
contrast.

[Series 3: neck 2.0 st · axial · 0.42mm/px · z∈[-250,-210]mm · 2 of 118 slices shown (1 of 3)]
[im 20/118  bone]
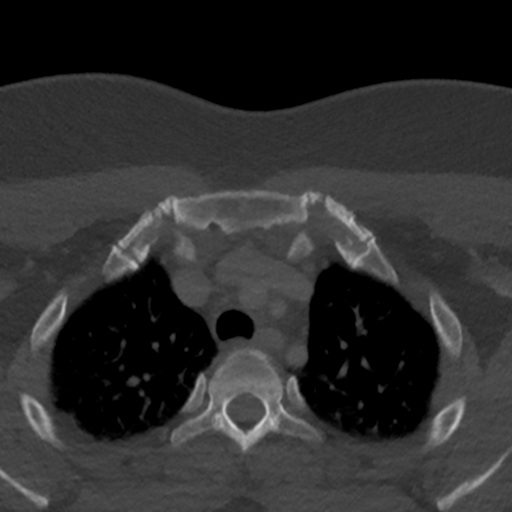
[im 40/118  bone]
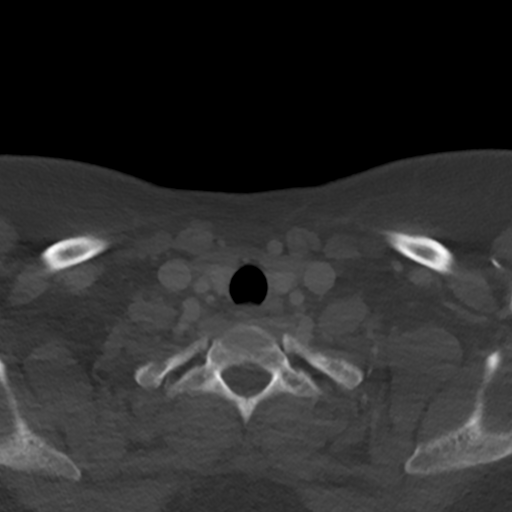

[Series 5: neck 2.0 st · sagittal · 0.46mm/px · 5 of 117 slices shown, 6 images (2 of 3)]
[im 39/117  bone]
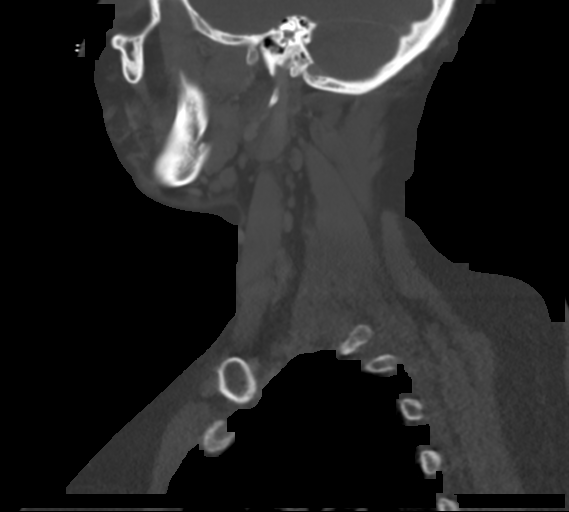
[im 49/117  bone]
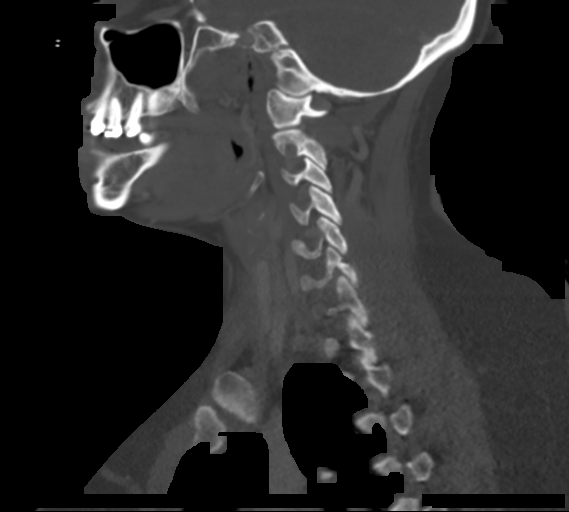
[im 59/117  soft-tissue]
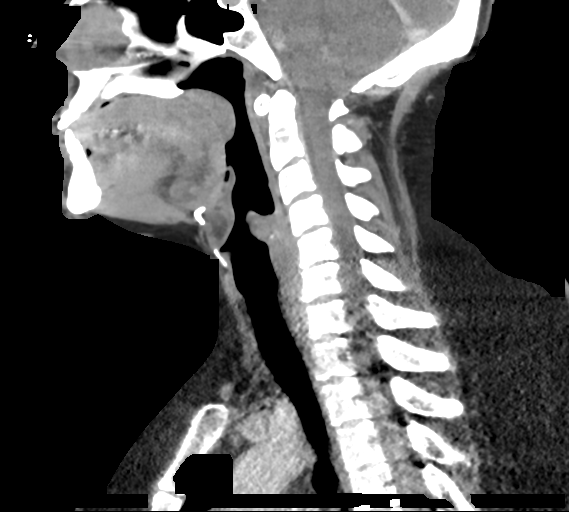
[im 59/117  bone]
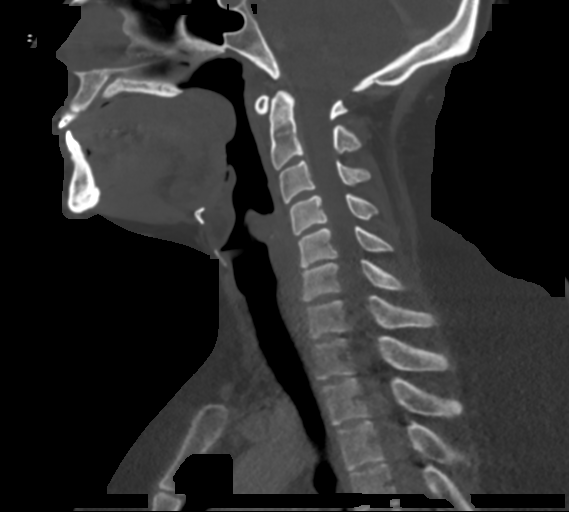
[im 68/117  bone]
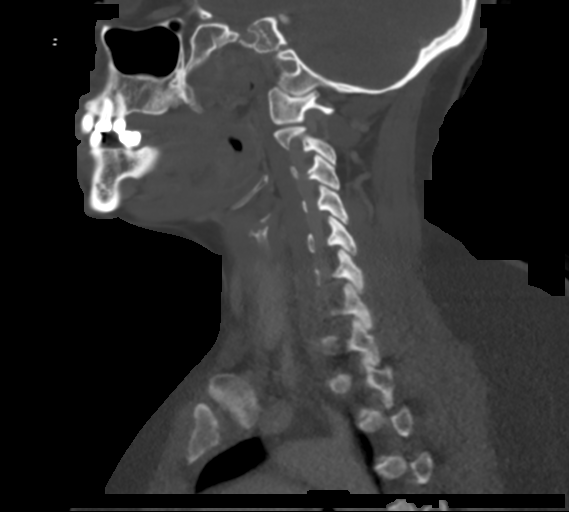
[im 78/117  bone]
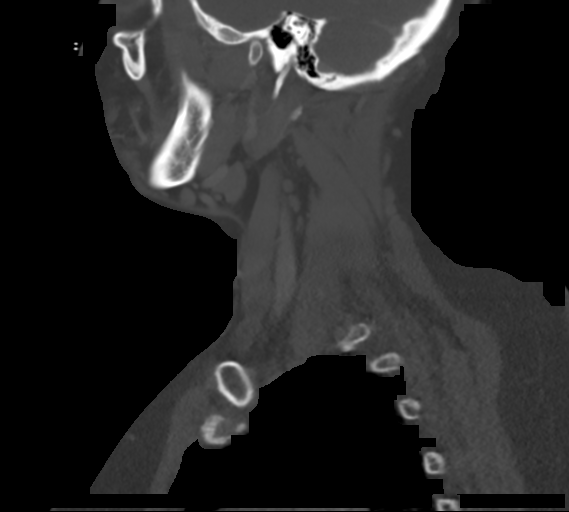

[Series 6: neck 2.0 st · coronal · 0.46mm/px · 3 of 102 slices shown (3 of 3)]
[im 21/102  bone]
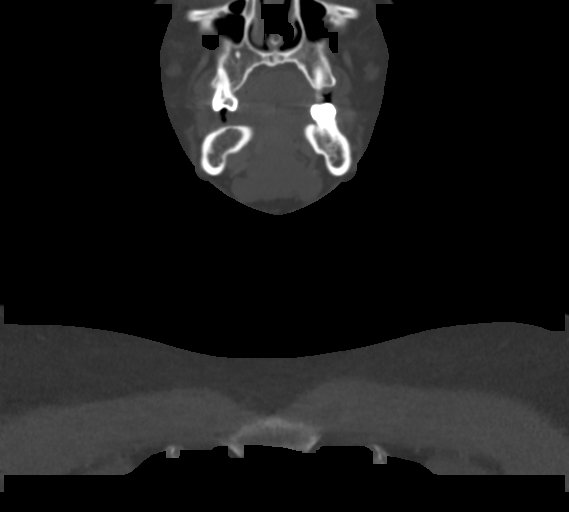
[im 41/102  bone]
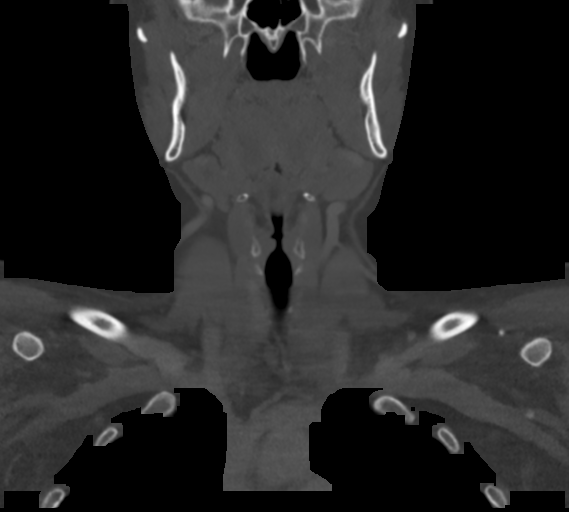
[im 61/102  bone]
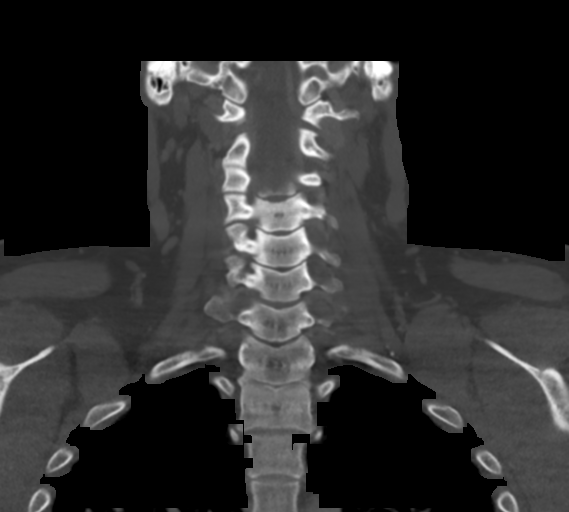

[Series 7: neck 2.0 st orthogonal · axial · 0.39mm/px · z∈[-265,-112]mm · 5 of 118 slices shown, 7 images]
[im 20/118  soft-tissue]
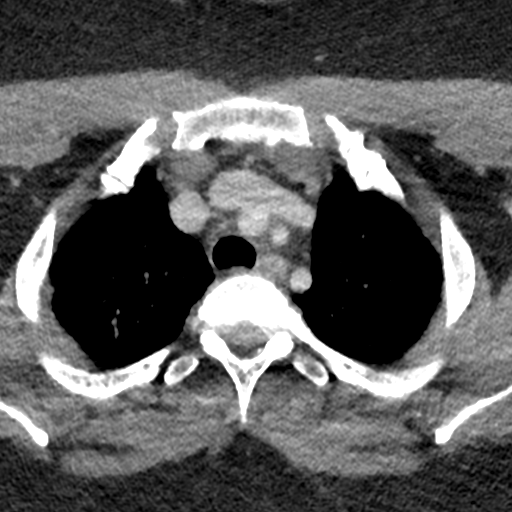
[im 20/118  bone]
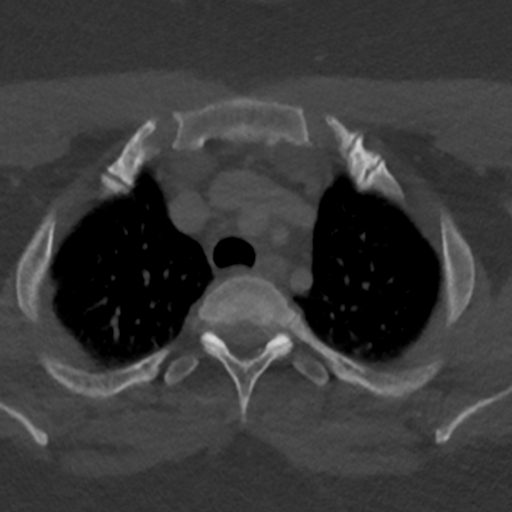
[im 40/118  bone]
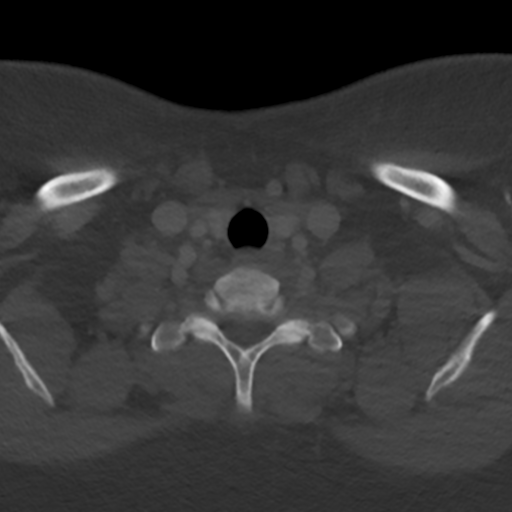
[im 59/118  bone]
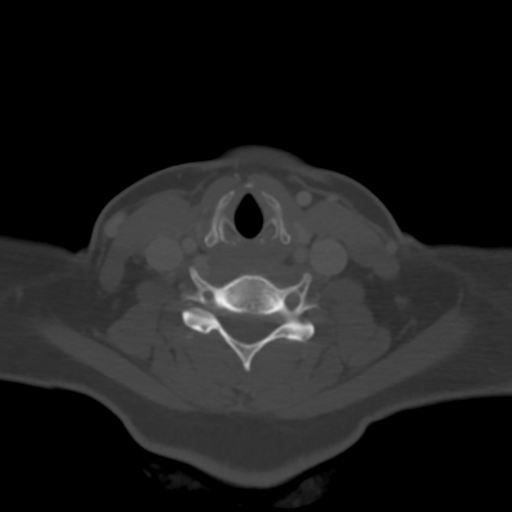
[im 79/118  bone]
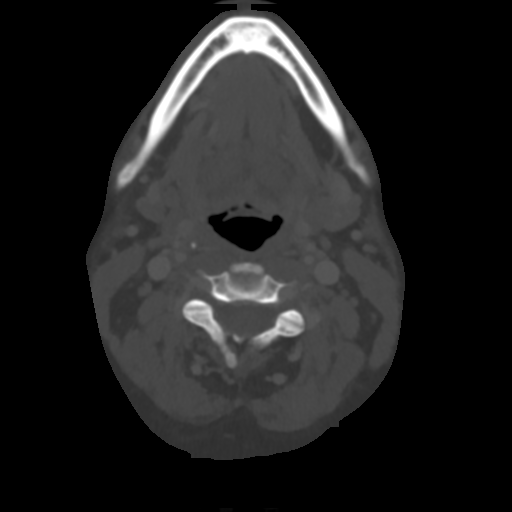
[im 98/118  soft-tissue]
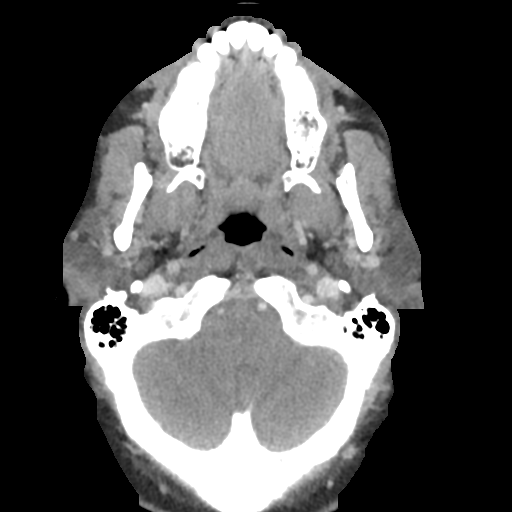
[im 98/118  bone]
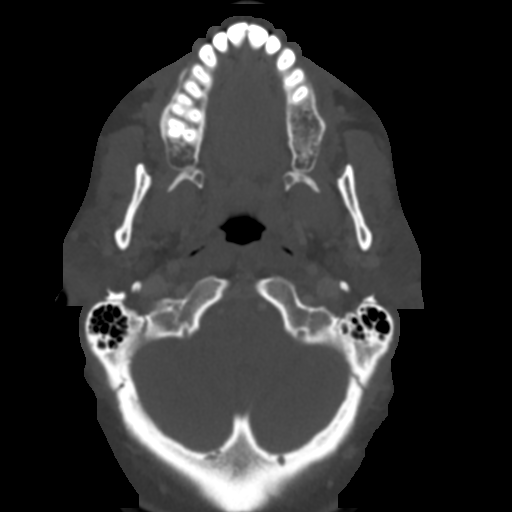

[15 of 33 positions shown; findings below may reference images not displayed]

RADIATION DOSE REDUCTION: This exam was performed according to the
departmental dose-optimization program which includes automated
exposure control, adjustment of the mA and/or kV according to
patient size and/or use of iterative reconstruction technique.

CONTRAST:  75mL QBNR95-RXX IOPAMIDOL (QBNR95-RXX) INJECTION 61%
FINDINGS: Pharynx and larynx: Normal. No mass or swelling.

Salivary glands: No inflammation, mass, or stone. No
hyperenhancement or ductal dilatation to suggest sialadenitis.

Thyroid: Normal.

Lymph nodes: No lymphadenopathy.

Vascular: Negative.

Limited intracranial: Negative.

Visualized orbits: Negative.

Mastoids and visualized paranasal sinuses: Clear.

Skeleton: Straightening and mild reversal of the normal cervical
lordosis, which could be positional. No acute osseous abnormality.

Upper chest: Negative.

Other: None.
IMPRESSION: No acute process in the neck. No evidence of sialadenitis or
lymphadenopathy.

## 2022-06-17 ENCOUNTER — Ambulatory Visit: Payer: Medicaid Other | Admitting: Obstetrics and Gynecology

## 2022-06-23 ENCOUNTER — Other Ambulatory Visit: Payer: Self-pay | Admitting: Physician Assistant

## 2022-06-23 MED ORDER — BUTALBITAL-APAP-CAFFEINE 50-325-40 MG PO CAPS: 1.0000 | ORAL_CAPSULE | Freq: Four times a day (QID) | ORAL | 0 refills | Status: DC | PRN

## 2022-06-23 MED ORDER — EMGALITY 120 MG/ML ~~LOC~~ SOAJ: 1.0000 mL | SUBCUTANEOUS | 11 refills | Status: DC

## 2022-06-23 NOTE — Progress Notes (Signed)
Pt requested refills of Emgality and Fioricet.  She has an appt one month from today.  Refills sent in.   KTC

## 2022-06-24 ENCOUNTER — Encounter: Payer: Self-pay | Admitting: *Deleted

## 2022-06-30 ENCOUNTER — Other Ambulatory Visit: Payer: Self-pay | Admitting: Obstetrics & Gynecology

## 2022-06-30 DIAGNOSIS — G43009 Migraine without aura, not intractable, without status migrainosus: Secondary | ICD-10-CM

## 2022-06-30 MED ORDER — BUTALBITAL-APAP-CAFFEINE 50-325-40 MG PO CAPS: 1.0000 | ORAL_CAPSULE | Freq: Four times a day (QID) | ORAL | 0 refills | Status: DC | PRN

## 2022-07-09 ENCOUNTER — Ambulatory Visit: Admission: RE | Admit: 2022-07-09 | Payer: Medicaid Other | Source: Ambulatory Visit

## 2022-07-14 ENCOUNTER — Other Ambulatory Visit: Payer: Self-pay | Admitting: Family Medicine

## 2022-07-14 DIAGNOSIS — M5416 Radiculopathy, lumbar region: Secondary | ICD-10-CM

## 2022-07-14 DIAGNOSIS — M5136 Other intervertebral disc degeneration, lumbar region: Secondary | ICD-10-CM

## 2022-07-16 ENCOUNTER — Other Ambulatory Visit: Payer: Self-pay | Admitting: Obstetrics & Gynecology

## 2022-07-16 DIAGNOSIS — G43009 Migraine without aura, not intractable, without status migrainosus: Secondary | ICD-10-CM

## 2022-07-21 ENCOUNTER — Other Ambulatory Visit: Payer: Self-pay

## 2022-07-22 ENCOUNTER — Encounter: Payer: Self-pay | Admitting: Gastroenterology

## 2022-07-22 ENCOUNTER — Telehealth (INDEPENDENT_AMBULATORY_CARE_PROVIDER_SITE_OTHER): Payer: Medicaid Other | Admitting: Gastroenterology

## 2022-07-22 VITALS — Ht 62.0 in | Wt 180.0 lb

## 2022-07-22 DIAGNOSIS — K219 Gastro-esophageal reflux disease without esophagitis: Secondary | ICD-10-CM | POA: Diagnosis not present

## 2022-07-22 DIAGNOSIS — R1319 Other dysphagia: Secondary | ICD-10-CM | POA: Diagnosis not present

## 2022-07-22 MED ORDER — PANTOPRAZOLE SODIUM 40 MG PO TBEC: 40.0000 mg | DELAYED_RELEASE_TABLET | Freq: Every day | ORAL | 5 refills | Status: DC

## 2022-07-22 NOTE — Progress Notes (Signed)
Tina Minium, Tina Holder [address]  [address]  [place], [place] [address]  Main: [phone]  Fax: [phone]    Gastroenterology Virtual/Video Visit  Referring Provider:     Samuella Holder Primary Care Physician:  Tina Holder Primary Gastroenterologist:  Tina Holder Reason for Consultation:     GERD and gastritis        HPI:    Virtual Visit via Video Note Location of the patient: Work Location of provider: Home Office Participating persons: The patient and myself.  I connected with Tina Holder on 07/22/22 at  9:15 AM EDT by a video enabled telemedicine application and verified that I am speaking with the correct person using two identifiers.   I discussed the limitations of evaluation and management by telemedicine and the availability of in person appointments. The patient expressed understanding and agreed to proceed.  Verbal consent to proceed obtained.  History of Present Illness: Tina Holder is a 37 y.o. female referred by Tina Holder, Tina Glassman, Tina Holder  for consultation & management of GERD and gastritis.  This patient comes in after being seen in the past at [facility] by Tina Holder the patient had a upper endoscopy for dysphagia and heartburn with biopsies that showed:  A. STOMACH, BIOPSY:  - Gastric antral mucosa with mild nonspecific reactive gastropathy  - Gastric oxyntic mucosa with no specific histopathologic changes  - Warthin Starry stain is negative for Helicobacter pylori   B. ESOPHAGUS, MID, BIOPSY:  - Esophageal squamous mucosa with no specific histopathologic changes  - Negative for increased intraepithelial eosinophils   The patient also underwent a CT scan of the abdomen back in September 2023 for intermittent right lower quadrant pain that showed:  IMPRESSION: 1. Mild wall thickening versus underdistention of the gastric antrum may reflect gastritis. 2. Diffuse hepatic steatosis which in the setting of  steatohepatitis can be a cause of abdominal pain. 3. Scattered colonic diverticulosis without findings of acute diverticulitis. 4. No follow-up imaging recommended. Note: This recommendation does not apply to premenarchal patients and to those with increased risk (genetic, family history, elevated tumor markers or other high-risk factors) of ovarian cancer.  In October 2022 the patient also had a normal gastric emptying study in addition to a normal gallbladder emptying study.  The patient has contacted her previous gastroenterologist many times for intolerance to certain foods and abdominal pain attacks.  The patient reports that she is taking colestipol for her diarrhea after having her gallbladder taken out.  The patient had an attack of what she thinks was a bug back in January but her PCP told her she may have peptic ulcer disease despite being on Nexium twice a day.  The patient denies any black stools or bloody stools but does report that she has had dilation of her esophagus in the past and is now having problems swallowing again.  She states it happens with the colestipol frequently because the pill is so big.  Past Medical History:  Diagnosis Date   Asthma    Collagen vascular disease (HCC)    Headache(784.0)    OTC med prn   Obesity    Pregnancy induced hypertension 2009, 2017   resolved after pregnancy   SVD (spontaneous vaginal delivery)    x 4    Past Surgical History:  Procedure Laterality Date   ABDOMINAL HYSTERECTOMY     BALLOON DILATION N/A 11/26/2020   Procedure: BALLOON DILATION;  Surgeon: Tina Bal, Tina Holder;  Location: [facility];  Service: Endoscopy;  Laterality: N/A;   BIOPSY  11/26/2020   Procedure: BIOPSY;  Surgeon: Tina Bal, Tina Holder;  [facility]: [facility];  Service: Endoscopy;;   ESOPHAGOGASTRODUODENOSCOPY (EGD) WITH PROPOFOL N/A 11/26/2020   Procedure: ESOPHAGOGASTRODUODENOSCOPY (EGD) WITH PROPOFOL;  Surgeon: Tina Bal, Tina Holder;  Location:  [facility];  Service: Endoscopy;  Laterality: N/A;  8:30am   LAPAROSCOPY N/A 04/15/2018   Procedure: DIAGNOSTIC LAPAROSCOPY WITH EVACUATION OF HEMATOMA;  Surgeon: Tina Bores, Tina Holder;  Location: [facility];  Service: Gynecology;  Laterality: N/A;   REPAIR VAGINAL CUFF N/A 04/15/2018   Procedure: REPAIR VAGINAL CUFF;  Surgeon: Tina Bores, Tina Holder;  Location: [facility];  Service: Gynecology;  Laterality: N/A;   TONSILLECTOMY  2005   TUBAL LIGATION  01/01/2012   Procedure: POST PARTUM TUBAL LIGATION;  Surgeon: Tina Rosenthal, Tina Holder;  Location: [facility];  Service: Gynecology;  Laterality: Bilateral;  with filshie clips   VAGINAL HYSTERECTOMY Right 04/12/2018   Procedure: HYSTERECTOMY VAGINAL WITH RIGHT SALPINGECTOMY;  Surgeon: Tina Bores, Tina Holder;  Location: [facility];  Service: Gynecology;  Laterality: Right;   WISDOM TOOTH EXTRACTION      Prior to Admission medications   Medication Sig Start Date End Date Taking? Authorizing Provider  Butalbital-APAP-Caffeine 50-325-40 MG capsule TAKE ONE TO TWO CAPSULES BY MOUTH EVERY 6 HOURS AS NEEDED FOR HEADACHE 07/16/22   Tina Holder, Tina Bastos, Tina Holder  colestipol (COLESTID) 1 g tablet Take 2 g by mouth 2 (two) times daily. 11/05/21   Provider, Historical, Tina Holder  cyclobenzaprine (FLEXERIL) 10 MG tablet Take 1 tablet (10 mg total) by mouth at bedtime as needed for muscle spasms. 08/29/21   Tina Holder, Tina Holder, Tina Holder  eletriptan (RELPAX) 40 MG tablet Take 1 tablet (40 mg total) by mouth as needed for migraine or headache. May repeat in 2 hours if headache persists or recurs. 08/29/21   Tina Holder, Tina Holder, Tina Holder  esomeprazole (NEXIUM) 20 MG capsule TAKE ONE CAPSULE BY MOUTH TWICE A DAY BEFORE A MEAL 01/29/22   Tina Holder K, Tina Holder  fluconazole (DIFLUCAN) 150 MG tablet Take as directed. 02/13/22   Mickie Bail, NP  Galcanezumab-gnlm (EMGALITY) 120 MG/ML SOAJ Inject 1 mL into the skin every 30 (thirty) days. 06/23/22   Tina Holder, Tina Holder, Tina Holder  leflunomide (ARAVA) 20 MG tablet Take  20 mg by mouth daily. 09/05/21   Provider, Historical, Tina Holder  metoCLOPramide (REGLAN) 10 MG tablet Take 1 tablet (10 mg total) by mouth every 6 (six) hours as needed for nausea. 03/01/20   Tina Holder, Tina Holder, Tina Holder  metoprolol tartrate (LOPRESSOR) 25 MG tablet Take 1 tablet (25 mg total) by mouth 2 (two) times daily. 04/09/22   Tina Holder, Tina Holder, Tina Holder  promethazine (PHENERGAN) 25 MG tablet TAKE ONE TABLET BY MOUTH EVERY 6 HOURS AS NEEDED FOR NAUSEA AND HEADACHE 08/29/21   Tina Holder, Tina Holder, Tina Holder  traMADol (ULTRAM) 50 MG tablet Take 50 mg by mouth every 8 (eight) hours as needed. 09/24/21   Provider, Historical, Tina Holder  tranexamic acid (LYSTEDA) 650 MG TABS tablet Tranexamic acid 2 tablets po every 8 hours x 5 days prn bleeding and as directed for procedures 11/02/21   Provider, Historical, Tina Holder  VENTOLIN HFA 108 (90 Base) MCG/ACT inhaler Inhale into the lungs. 11/26/21   Provider, Historical, Tina Holder    Family History  Problem Relation Age of Onset   Arthritis Mother    Thyroid disease Maternal Grandmother    Lupus  Maternal Grandmother      Social History   Tobacco Use   Smoking status: Never   Smokeless tobacco: Never  Vaping Use   Vaping Use: Never used  Substance Use Topics   Alcohol use: No   Drug use: No    Allergies as of 07/22/2022 - Review Complete 04/18/2022  Allergen Reaction Noted   Aspirin  06/18/2021   Nsaids  06/18/2021   Wound dressing adhesive  10/25/2019   Oxycodone Nausea And Vomiting 09/23/2021    Review of Systems:    All systems reviewed and negative except where noted in HPI.   Observations/Objective:  Labs: CBC    Component Value Date/Time   WBC 5.3 12/11/2021 1624   RBC 4.52 12/11/2021 1624   HGB 13.9 12/11/2021 1624   HGB 13.2 05/02/2018 1517   HCT 40.4 12/11/2021 1624   HCT 40.0 05/02/2018 1517   PLT 171 12/11/2021 1624   PLT 274 05/02/2018 1517   MCV 89.4 12/11/2021 1624   MCV 91 05/02/2018 1517   MCH 30.8 12/11/2021 1624   MCHC 34.4 12/11/2021  1624   RDW 12.1 12/11/2021 1624   RDW 13.7 05/02/2018 1517   LYMPHSABS 1.2 12/11/2021 1624   MONOABS 0.6 12/11/2021 1624   EOSABS 0.0 12/11/2021 1624   BASOSABS 0.0 12/11/2021 1624   CMP     Component Value Date/Time   NA 138 12/11/2021 1624   K 4.3 12/11/2021 1624   CL 106 12/11/2021 1624   CO2 27 12/11/2021 1624   GLUCOSE 90 12/11/2021 1624   BUN 19 12/11/2021 1624   CREATININE 0.89 12/11/2021 1624   CALCIUM 9.1 12/11/2021 1624   PROT 7.0 12/11/2021 1624   ALBUMIN 4.5 12/11/2021 1624   AST 20 12/11/2021 1624   ALT 19 12/11/2021 1624   ALKPHOS 61 12/11/2021 1624   BILITOT 0.2 (L) 12/11/2021 1624   GFRNONAA >60 12/11/2021 1624   GFRAA >60 04/14/2018 1322    Imaging Studies: No results found.  Assessment and Plan:   Bryanda Mikel is a 37 y.o. y/o female has been referred for multiple GI issues including ileus diarrhea for which she is taking colestipol.  She does not need a refill of that medication at the present time.  The patient states that her Nexium at 20 mg twice a day is not keeping her symptoms under the control and she is having acid breakthrough.  The patient has been told to switch to pantoprazole 40 mg once a day and let me know how it is doing.  The patient will also be set up for an upper endoscopy due to her recurrent dysphagia.  The patient has been explained the plan and agrees with it.  Follow Up Instructions:  I discussed the assessment and treatment plan with the patient. The patient was provided an opportunity to ask questions and all were answered. The patient agreed with the plan and demonstrated an understanding of the instructions.   The patient was advised to call back or seek an in-person evaluation if the symptoms worsen or if the condition fails to improve as anticipated.  I provided 25 minutes of non-face-to-face time during this encounter including chart review In preparation for the encounter.   Tina Minium, Tina Holder  Speech recognition  software was used to dictate the above note.

## 2022-07-23 NOTE — Addendum Note (Signed)
Addended by: Roena Malady on: 07/23/2022 05:14 PM   Modules accepted: Orders

## 2022-07-24 ENCOUNTER — Ambulatory Visit (INDEPENDENT_AMBULATORY_CARE_PROVIDER_SITE_OTHER): Payer: Medicaid Other | Admitting: Family Medicine

## 2022-07-24 ENCOUNTER — Encounter: Payer: Self-pay | Admitting: Family Medicine

## 2022-07-24 ENCOUNTER — Ambulatory Visit (INDEPENDENT_AMBULATORY_CARE_PROVIDER_SITE_OTHER): Payer: Medicaid Other | Admitting: Physician Assistant

## 2022-07-24 ENCOUNTER — Encounter: Payer: Self-pay | Admitting: Physician Assistant

## 2022-07-24 VITALS — BP 123/83 | HR 76 | Wt 196.0 lb

## 2022-07-24 VITALS — BP 123/83 | Wt 196.0 lb

## 2022-07-24 DIAGNOSIS — G43701 Chronic migraine without aura, not intractable, with status migrainosus: Secondary | ICD-10-CM

## 2022-07-24 DIAGNOSIS — M62838 Other muscle spasm: Secondary | ICD-10-CM | POA: Diagnosis not present

## 2022-07-24 DIAGNOSIS — B3731 Acute candidiasis of vulva and vagina: Secondary | ICD-10-CM

## 2022-07-24 DIAGNOSIS — G43001 Migraine without aura, not intractable, with status migrainosus: Secondary | ICD-10-CM | POA: Diagnosis not present

## 2022-07-24 MED ORDER — ELETRIPTAN HYDROBROMIDE 40 MG PO TABS: 40.0000 mg | ORAL_TABLET | ORAL | 11 refills | Status: DC | PRN

## 2022-07-24 MED ORDER — CYCLOBENZAPRINE HCL 10 MG PO TABS: 10.0000 mg | ORAL_TABLET | Freq: Every evening | ORAL | 5 refills | Status: DC | PRN

## 2022-07-24 MED ORDER — NURTEC 75 MG PO TBDP: 75.0000 mg | ORAL_TABLET | ORAL | 11 refills | Status: DC

## 2022-07-24 MED ORDER — METOPROLOL SUCCINATE ER 50 MG PO TB24: 50.0000 mg | ORAL_TABLET | Freq: Every day | ORAL | 5 refills | Status: AC

## 2022-07-24 MED ORDER — PROMETHAZINE HCL 25 MG PO TABS: ORAL_TABLET | ORAL | 1 refills | Status: DC

## 2022-07-24 NOTE — Patient Instructions (Signed)
Migraine Headache A migraine headache is a very strong throbbing pain on one or both sides of your head. This type of headache can also cause other symptoms. It can last from 4 hours to 3 days. Talk with your doctor about what things may bring on (trigger) this condition. What are the causes? The exact cause of a migraine is not known. This condition may be brought on or caused by: Smoking. Medicines, such as: Medicine used to treat chest pain (nitroglycerin). Birth control pills. Estrogen. Some blood pressure medicines. Certain substances in some foods or drinks. Foods and drinks, such as: Cheese. Chocolate. Alcohol. Caffeine. Doing physical activity that is very hard. Other things that may trigger a migraine headache include: Periods. Pregnancy. Hunger. Stress. Getting too much or too little sleep. Weather changes. Feeling tired (fatigue). What increases the risk? Being 25-55 years old. Being female. Having a family history of migraine headaches. Being Caucasian. Having a mental health condition, such as being sad (depressed) or feeling worried or nervous (anxious). Being very overweight (obese). What are the signs or symptoms? A throbbing pain. This pain may: Happen in any area of the head, such as on one or both sides. Make it hard to do daily activities. Get worse with physical activity. Get worse around bright lights, loud noises, or smells. Other symptoms may include: Feeling like you may vomit (nauseous). Vomiting. Dizziness. Before a migraine headache starts, you may get warning signs (an aura). An aura may include: Seeing flashing lights or having blind spots. Seeing bright spots, halos, or zigzag lines. Having tunnel vision or blurred vision. Having numbness or a tingling feeling. Having trouble talking. Having weak muscles. After a migraine ends, you may have symptoms. These may include: Tiredness. Trouble thinking (concentrating). How is this  treated? Taking medicines that: Relieve pain. Relieve the feeling like you may vomit. Prevent migraine headaches. Treatment may also include: Acupuncture. Lifestyle changes like avoiding foods that bring on migraine headaches. Learning ways to control your body functions (biofeedback). Therapy to help you know and deal with negative thoughts (cognitive behavioral therapy). Follow these instructions at home: Medicines Take over-the-counter and prescription medicines only as told by your doctor. If told, take steps to prevent problems with pooping (constipation). You may need to: Drink enough fluid to keep your pee (urine) pale yellow. Take medicines. You will be told what medicines to take. Eat foods that are high in fiber. These include beans, whole grains, and fresh fruits and vegetables. Limit foods that are high in fat and sugar. These include fried or sweet foods. Ask your doctor if you should avoid driving or using machines while you are taking your medicine. Lifestyle  Do not drink alcohol. Do not smoke or use any products that contain nicotine or tobacco. If you need help quitting, ask your doctor. Get 7-9 hours of sleep each night, or the amount recommended by your doctor. Find ways to deal with stress, such as meditation, deep breathing, or yoga. Try to exercise often. This can help lessen how bad and how often your migraines happen. General instructions Keep a journal to find out what may bring on your migraine headaches. This can help you avoid those things. For example, write down: What you eat and drink. How much sleep you get. Any change to your medicines or diet. If you have a migraine headache: Avoid things that make your symptoms worse, such as bright lights. Lie down in a dark, quiet room. Do not drive or use machinery. Ask your   doctor what activities are safe for you. Where to find more information Coalition for Headache and Migraine Patients (CHAMP):  headachemigraine.org American Migraine Foundation: americanmigrainefoundation.org National Headache Foundation: headaches.org Contact a doctor if: You get a migraine headache that is different or worse than others you have had. You have more than 15 days of headaches in one month. Get help right away if: Your migraine headache gets very bad. Your migraine headache lasts more than 72 hours. You have a fever or stiff neck. You have trouble seeing. Your muscles feel weak or like you cannot control them. You lose your balance a lot. You have trouble walking. You faint. You have a seizure. This information is not intended to replace advice given to you by your health care provider. Make sure you discuss any questions you have with your health care provider. Document Revised: 11/10/2021 Document Reviewed: 11/10/2021 Elsevier Patient Education  2023 Elsevier Inc.  

## 2022-07-24 NOTE — Progress Notes (Signed)
CC: Denies any current symptoms or issues  Pt stating that she has had recurrent yeast infections and wants to discuss that, stating that she has not had one since around feb.

## 2022-07-24 NOTE — Progress Notes (Signed)
   Subjective:    Patient ID: Tina Holder is a 37 y.o. female presenting with Gynecologic Exam  on 07/24/2022  HPI: Patient with several bouts of recurrent yeast infections in the fall and winter. No change in routine. No baths, no hot tubs, no change in soaps. Notes increasing frequency of sex.  Review of Systems  Constitutional:  Negative for chills and fever.  Respiratory:  Negative for shortness of breath.   Cardiovascular:  Negative for chest pain.  Gastrointestinal:  Negative for abdominal pain, nausea and vomiting.  Genitourinary:  Negative for dysuria.  Skin:  Negative for rash.      Objective:    BP 123/83   Wt 196 lb (88.9 kg)   LMP 03/14/2018 (Exact Date)   BMI 35.85 kg/m  Physical Exam Exam conducted with a chaperone present.  Constitutional:      General: She is not in acute distress.    Appearance: She is well-developed.  HENT:     Head: Normocephalic and atraumatic.  Eyes:     General: No scleral icterus. Cardiovascular:     Rate and Rhythm: Normal rate.  Pulmonary:     Effort: Pulmonary effort is normal.  Abdominal:     Palpations: Abdomen is soft.  Musculoskeletal:     Cervical back: Neck supple.  Skin:    General: Skin is warm and dry.  Neurological:     Mental Status: She is alert and oriented to person, place, and time.         Assessment & Plan:  Yeast vaginitis - recurrent, but no current symptoms. Trial of boric acid after sex. Good vaginal hygiene reveiwed.  Return if symptoms worsen or fail to improve.  Reva Bores, MD 07/24/2022 12:00 PM

## 2022-07-24 NOTE — Progress Notes (Signed)
CC: follow up headaches  Good this week  Month worse    History:  Tina Holder is a 37 y.o. [phone] who presents to clinic today eval of worsening migraine.  She notes emgality is not working as well now.  It helped until about 2 months ago.  She had switched from Ajovy when it stopped working well.    She is unclear exactly when she began using emgality.  She liked nurtec very well when she was able to use the samples, but insurance did not cover it at that time.  That was in 2023 and she agrees it is reasonable to try for coverage again.   Injections requested today as previous.  She notes these often help for a few months.   Last month she used benadryl for rescue which did help the second night she used it.    Number of days in the last 4 weeks with:  Severe headache:6 Moderate headache: 8 Mild headache: 7  No headache: 7   Past Medical History:  Diagnosis Date   Asthma    Collagen vascular disease (HCC)    Headache(784.0)    OTC med prn   Obesity    Pregnancy induced hypertension 2009, 2017   resolved after pregnancy   SVD (spontaneous vaginal delivery)    x 4    Social History   Socioeconomic History   Marital status: Married    Spouse name: Not on file   Number of children: 4   Years of education: Not on file   Highest education level: Not on file  Occupational History   Not on file  Tobacco Use   Smoking status: Never   Smokeless tobacco: Never  Vaping Use   Vaping Use: Never used  Substance and Sexual Activity   Alcohol use: No   Drug use: No   Sexual activity: Not Currently    Birth control/protection: None, Surgical    Comment: tubal  Other Topics Concern   Not on file  Social History Narrative   Not on file   Social Determinants of Health   Financial Resource Strain: Not on file  Food Insecurity: Not on file  Transportation Needs: Not on file  Physical Activity: Not on file  Stress: Not on file  Social Connections: Not on file  Intimate  Partner Violence: Not on file    Family History  Problem Relation Age of Onset   Arthritis Mother    Thyroid disease Maternal Grandmother    Lupus Maternal Grandmother     Allergies  Allergen Reactions   Aspirin     Other reaction(s): Other (See Comments) ASA contraindicated with bleeding disorder ASA contraindicated with bleeding disorder    Nsaids     Other reaction(s): Other (See Comments) NSAIDS contraindicated with bleeding disorder. NSAIDS contraindicated with bleeding disorder.    Silicone Other (See Comments)   Wound Dressing Adhesive     Other reaction(s): Other (See Comments)   Oxycodone Nausea And Vomiting    Current Outpatient Medications on File Prior to Visit  Medication Sig Dispense Refill   Butalbital-APAP-Caffeine 50-325-40 MG capsule TAKE ONE TO TWO CAPSULES BY MOUTH EVERY 6 HOURS AS NEEDED FOR HEADACHE 20 capsule 2   colestipol (COLESTID) 1 g tablet Take 2 g by mouth 2 (two) times daily.     cyclobenzaprine (FLEXERIL) 10 MG tablet Take 1 tablet (10 mg total) by mouth at bedtime as needed for muscle spasms. 30 tablet 5   eletriptan (RELPAX) 40 MG tablet  Take 1 tablet (40 mg total) by mouth as needed for migraine or headache. May repeat in 2 hours if headache persists or recurs. 10 tablet 11   fluconazole (DIFLUCAN) 150 MG tablet Take as directed. 1 tablet 0   Galcanezumab-gnlm (EMGALITY) 120 MG/ML SOAJ Inject 1 mL into the skin every 30 (thirty) days. 1.12 mL 11   leflunomide (ARAVA) 20 MG tablet Take 20 mg by mouth daily.     metoCLOPramide (REGLAN) 10 MG tablet Take 1 tablet (10 mg total) by mouth every 6 (six) hours as needed for nausea. 30 tablet 1   metoprolol tartrate (LOPRESSOR) 25 MG tablet Take 1 tablet (25 mg total) by mouth 2 (two) times daily. 60 tablet 3   pantoprazole (PROTONIX) 40 MG tablet Take 1 tablet (40 mg total) by mouth daily. 30 tablet 5   promethazine (PHENERGAN) 25 MG tablet TAKE ONE TABLET BY MOUTH EVERY 6 HOURS AS NEEDED FOR NAUSEA  AND HEADACHE 20 tablet 1   traMADol (ULTRAM) 50 MG tablet Take 50 mg by mouth every 8 (eight) hours as needed.     tranexamic acid (LYSTEDA) 650 MG TABS tablet Tranexamic acid 2 tablets po every 8 hours x 5 days prn bleeding and as directed for procedures     VENTOLIN HFA 108 (90 Base) MCG/ACT inhaler Inhale into the lungs.     No current facility-administered medications on file prior to visit.     Review of Systems:  All pertinent positive/negative included in HPI, all other review of systems are negative   Objective:  Physical Exam BP 123/83   Pulse 76   Wt 196 lb (88.9 kg)   LMP 03/14/2018 (Exact Date)   BMI 35.85 kg/m  CONSTITUTIONAL: Well-developed, well-nourished female in no acute distress.  EYES: EOM intact ENT: Normocephalic CARDIOVASCULAR: Regular rate  RESPIRATORY: Normal rate.  MUSCULOSKELETAL: Normal ROM SKIN: Warm, dry without erythema  NEUROLOGICAL: Alert, oriented, CN II-XII grossly intact, Appropriate balance PSYCH: Normal behavior, mood  PROCEDURE:  TRIGGER POINT INJECTIONS  Procedure: Mixture of 1%  Lidocaine, marcaine and dexamethazone in a ratio of 2:2:1  injected with 1cc each site in bilateral greater Occipital Nerves, bilateral lesser occipital nerves, bilateral cervical paraspinal muscles, bilateral trapezius.  Total amount injected: 15cc.  Pt tolerated procedure well.   Assessment & Plan:  Assessment: 1. Muscle spasm   2. Migraine without aura and with status migrainosus, not intractable   3. Chronic migraine without aura with status migrainosus, not intractable      Plan: Continue metoprolol at 50mg  daily but with once daily dosing (rather than 25mg  bid) Nurtec every other day for prevention - in hopes that she can get that covered.   Relpax for acute migraine treatment Fioricet for rescue Phenergan for rescue/nausea OTC benadryl has also been effective rescue strategy Cyclobenzaprine - 1/2 to 1 tab prn muscle spasm/headache Follow-up in  3 months or sooner PRN    Bertram Denver, PA-C 07/24/2022 10:30 AM

## 2022-07-25 ENCOUNTER — Ambulatory Visit
Admission: RE | Admit: 2022-07-25 | Discharge: 2022-07-25 | Disposition: A | Discharge status: Home / Self Care | Payer: Medicaid Other | Source: Ambulatory Visit | Attending: Family Medicine | Admitting: Family Medicine

## 2022-07-25 DIAGNOSIS — M5136 Other intervertebral disc degeneration, lumbar region: Secondary | ICD-10-CM

## 2022-07-25 DIAGNOSIS — M5416 Radiculopathy, lumbar region: Secondary | ICD-10-CM

## 2022-07-28 ENCOUNTER — Encounter: Payer: Self-pay | Admitting: *Deleted

## 2022-07-28 ENCOUNTER — Encounter: Payer: Self-pay | Admitting: Gastroenterology

## 2022-07-30 MED ORDER — COLESTIPOL HCL 1 G PO TABS: 1.0000 g | ORAL_TABLET | Freq: Two times a day (BID) | ORAL | 5 refills | Status: DC

## 2022-07-30 NOTE — Addendum Note (Signed)
Addended by: Roena Malady on: 07/30/2022 11:10 AM   Modules accepted: Orders

## 2022-08-06 ENCOUNTER — Encounter: Payer: Self-pay | Admitting: Anesthesiology

## 2022-08-17 ENCOUNTER — Encounter: Payer: Self-pay | Admitting: Gastroenterology

## 2022-08-17 ENCOUNTER — Telehealth: Payer: Self-pay

## 2022-08-17 DIAGNOSIS — D699 Hemorrhagic condition, unspecified: Secondary | ICD-10-CM | POA: Insufficient documentation

## 2022-08-17 NOTE — Telephone Encounter (Signed)
Advised will review procedure with Dr. Marcheta Grammes for hemostasis planning. Notes rectal bleeding, bright red, every BR visit, has tried some TXA, appropriate dosing, did not sem to help but will try again - requests refill. Advised follow up with GI surgery team. Will follow up by phone /mychart with hemostasis recommendations. Agrees to call with questions/concerns. Stephens Shire, RN-BC  use tranexamic acid 2 tablets po every 8 hours, start night before the procedure and continue for 5 days after. Already avoids use of NSAIDS. Reviewed Dr. Marcheta Grammes aware of lab results, not concerned with platelet count, and this would not be related to energy levels. Reviewed [facility] contact info, encouraged calling for bleeding, injury, planning procedures, questions/concerns. Stephens Shire, RN-BC   Pt reports she has the medication as previously instructed from [facility]

## 2022-08-18 NOTE — Telephone Encounter (Signed)
Clearance faxed to Dr Marcheta Grammes, [facility] regarding pt's bleeding disorder and instructions for procedure, medication

## 2022-08-20 NOTE — Telephone Encounter (Signed)
Clearance faxed x 2 

## 2022-08-28 NOTE — Telephone Encounter (Signed)
Clearance faxed x 2 

## 2022-09-08 NOTE — Telephone Encounter (Signed)
Left message on voicemail requesting call back regarding clearance for pt due to bleeding disorder

## 2022-09-08 NOTE — Telephone Encounter (Signed)
Received a voicemail confirming fax number, it is different than what is in the system... Form faxed to correct fax [phone]

## 2022-09-09 NOTE — Telephone Encounter (Signed)
Re faxed x 3

## 2022-09-15 ENCOUNTER — Other Ambulatory Visit: Payer: Self-pay | Admitting: Internal Medicine

## 2022-09-23 NOTE — Telephone Encounter (Signed)
Lmovm nurse line regarding procedure clearance and refaxed x 2

## 2022-09-28 NOTE — Telephone Encounter (Signed)
I spoke with Tina Holder and she stated that they do have the form... The Dr will be in the office tomorrow... She will put in her folder to review and sign

## 2022-10-02 NOTE — Telephone Encounter (Signed)
Due to your bleeding disorder, Dr. Kerry Dory recommends the below hemostasis plan and it has been sent to [facility]:  BEFORE PROCEDURE:  - You should take Tranexamic acid 650 mg tablet, 2 tabs the night before procedure.  -prescription for tranexamic acid has been sent to your local pharmacy. Please pick it up prior to your procedure.  AFTER PROCEDURE - If biopsies are taken, then you should take Tranexamic acid 2 tabs TID x 5 days after procedure.   You should avoid Aspirin and NSAIDs.  You should monitor for bleeding post-discharge and contact both [facility] and hematology providers should a problem arise.  Please call the [facility] for general questions or concerns [phone] option #2. Audie Clear, RN September 30, 2022 12:40 PM

## 2022-10-05 ENCOUNTER — Telehealth: Payer: Self-pay

## 2022-10-05 ENCOUNTER — Encounter: Payer: Self-pay | Admitting: Gastroenterology

## 2022-10-05 NOTE — Telephone Encounter (Signed)
Patient contacted office requesting to reschedule her tomorrows EGD with Dr. Servando Snare due to not feeling well. She has a headache, and nausea.  Please call patient back to reschedule EGD.  Thanks,  [place], [place]

## 2022-10-05 NOTE — Telephone Encounter (Signed)
Left message on voicemail  Called Endo to have them place her in the depot for me to reschedule tomorrow

## 2022-10-06 ENCOUNTER — Ambulatory Visit: Admission: RE | Admit: 2022-10-06 | Payer: Medicaid Other | Source: Home / Self Care | Admitting: Gastroenterology

## 2022-10-06 HISTORY — DX: Hemorrhagic condition, unspecified: D69.9

## 2022-10-06 HISTORY — DX: Presence of spectacles and contact lenses: Z97.3

## 2022-10-06 HISTORY — DX: Gastro-esophageal reflux disease without esophagitis: K21.9

## 2022-10-06 HISTORY — DX: Unspecified osteoarthritis, unspecified site: M19.90

## 2022-10-06 SURGERY — ESOPHAGOGASTRODUODENOSCOPY (EGD) WITH PROPOFOL
Anesthesia: General

## 2022-11-25 ENCOUNTER — Ambulatory Visit
Admission: RE | Admit: 2022-11-25 | Discharge: 2022-11-25 | Disposition: A | Discharge status: Home / Self Care | Payer: Medicaid Other | Source: Ambulatory Visit

## 2022-11-25 VITALS — BP 123/88 | HR 70 | Temp 98.6°F | Resp 16

## 2022-11-25 DIAGNOSIS — N898 Other specified noninflammatory disorders of vagina: Secondary | ICD-10-CM

## 2022-11-25 DIAGNOSIS — J029 Acute pharyngitis, unspecified: Secondary | ICD-10-CM | POA: Diagnosis not present

## 2022-11-25 LAB — WET PREP, GENITAL
Clue Cells Wet Prep HPF POC: NONE SEEN
Sperm: NONE SEEN
Trich, Wet Prep: NONE SEEN
WBC, Wet Prep HPF POC: <10 (ref <10)
Yeast Wet Prep HPF POC: NONE SEEN

## 2022-11-25 LAB — GROUP A STREP BY PCR: Group A Strep by PCR: NOT DETECTED

## 2022-11-25 MED ORDER — FLUCONAZOLE 150 MG PO TABS: 150.0000 mg | ORAL_TABLET | ORAL | 0 refills | Status: AC

## 2022-11-25 MED ORDER — LIDOCAINE VISCOUS HCL 2 % MT SOLN: 5.0000 mL | Freq: Four times a day (QID) | OROMUCOSAL | 0 refills | Status: DC | PRN

## 2022-11-25 NOTE — Discharge Instructions (Signed)
Your strep test today was negative.    Your sore throat may be a result of a respiratory virus.  Use over-the-counter Tylenol and/or ibuprofen according to the package instructions as needed for pain.  You may also gargle with salt water, or use over-the-counter Chloraseptic or Sucrets lozenges to aid in pain relief.  I have prescribed Magic mouthwash that you can gargle and spit with 4 times a day as needed.  Typically before meals and at bedtime.  Your vaginal swab was also negative for yeast but I am going to send over prescription for Diflucan to the pharmacy.  Take 1 tablet now and repeat dosing every 3 days for total of 3 doses.  If you develop any new or worsening symptoms please return for reevaluation or see your PCP.

## 2022-11-25 NOTE — ED Provider Notes (Signed)
[facility]    CSN: [id number] Arrival date & time: 11/25/22  0850      History   Chief Complaint Chief Complaint  Patient presents with   Mouth Lesions    Mouth of canker sores, along with beginning stages of a yeast infection - Entered by patient    HPI Tina Holder is a 37 y.o. female.   HPI  37 year old female with a past medical history significant for bleeding disorder, asthma, GERD, migraine headaches presenting for evaluation of 1 week worth of soreness to the back of her throat.  She denies any runny nose, nasal congestion, fever, or cough.  She is unaware of any sick contacts.  She does have a history of cold sores but she states that these are different in appearance and presentation.  She is also endorsing some vaginal itching but no discharge or urinary symptoms.  Past Medical History:  Diagnosis Date   Arthritis    lower back, hands   Asthma    Bleeding disorder (HCC)    Sees [facility].  Takes 5 days [place] prior to surgeries.   Collagen vascular disease (HCC)    GERD (gastroesophageal reflux disease)    Headache(784.0)    Migraines 2x/mo.   Obesity    Pregnancy induced hypertension 2009, 2017   resolved after pregnancy   SVD (spontaneous vaginal delivery)    x 4   Wears contact lenses     Patient Active Problem List   Diagnosis Date Noted   Bleeding disorder (HCC)    Chronic migraine without aura 12/12/2021   Migraine without aura and with status migrainosus, not intractable 09/08/2019   Migraine without aura and without status migrainosus, not intractable 05/12/2019   Muscle spasm 09/06/2015   GERD (gastroesophageal reflux disease) 11/25/2011   Asthma 09/22/2011   Migraine headache 06/10/2011   ALLERGIC RHINITIS CAUSE UNSPECIFIED 06/01/2010    Past Surgical History:  Procedure Laterality Date   ABDOMINAL HYSTERECTOMY     BALLOON DILATION N/A 11/26/2020   Procedure: BALLOON DILATION;  Surgeon: Lanelle Bal, DO;   Location: [facility];  Service: Endoscopy;  Laterality: N/A;   BIOPSY  11/26/2020   Procedure: BIOPSY;  Surgeon: Lanelle Bal, DO;  Location: [facility];  Service: Endoscopy;;   ESOPHAGOGASTRODUODENOSCOPY (EGD) WITH PROPOFOL N/A 11/26/2020   Procedure: ESOPHAGOGASTRODUODENOSCOPY (EGD) WITH PROPOFOL;  Surgeon: Lanelle Bal, DO;  Location: [facility];  Service: Endoscopy;  Laterality: N/A;  8:30am   LAPAROSCOPY N/A 04/15/2018   Procedure: DIAGNOSTIC LAPAROSCOPY WITH EVACUATION OF HEMATOMA;  Surgeon: Reva Bores, MD;  Location: [facility];  Service: Gynecology;  Laterality: N/A;   REPAIR VAGINAL CUFF N/A 04/15/2018   Procedure: REPAIR VAGINAL CUFF;  Surgeon: Reva Bores, MD;  Location: [facility];  Service: Gynecology;  Laterality: N/A;   TONSILLECTOMY  2005   TUBAL LIGATION  01/01/2012   Procedure: POST PARTUM TUBAL LIGATION;  Surgeon: Willodean Rosenthal, MD;  Location: [facility];  Service: Gynecology;  Laterality: Bilateral;  with filshie clips   VAGINAL HYSTERECTOMY Right 04/12/2018   Procedure: HYSTERECTOMY VAGINAL WITH RIGHT SALPINGECTOMY;  Surgeon: Reva Bores, MD;  Location: [facility];  Service: Gynecology;  Laterality: Right;   WISDOM TOOTH EXTRACTION      OB History     Gravida  4   Para  4   Term  4   Preterm  0   AB  0   Living  4  SAB  0   IAB  0   Ectopic  0   Multiple  0   Live Births  4            Home Medications    Prior to Admission medications   Medication Sig Start Date End Date Taking? Authorizing Provider  atomoxetine (STRATTERA) 60 MG capsule Take 60 mg by mouth every morning. 11/04/22  Yes [provider]  EMGALITY 120 MG/ML SOAJ Inject 1 mL into the skin every 30 (thirty) days. 11/16/22  Yes [provider]  Butalbital-APAP-Caffeine 50-325-40 MG capsule TAKE ONE TO TWO CAPSULES BY MOUTH EVERY 6 HOURS AS NEEDED FOR HEADACHE 07/16/22   Anyanwu, Jethro Bastos, MD  colestipol (COLESTID) 1 g tablet Take 1 tablet  (1 g total) by mouth 2 (two) times daily. 07/30/22   Midge Minium, MD  cyclobenzaprine (FLEXERIL) 10 MG tablet Take 1 tablet (10 mg total) by mouth at bedtime as needed for muscle spasms. 07/24/22   Glyn Ade, Scot Jun, PA-C  eletriptan (RELPAX) 40 MG tablet Take 1 tablet (40 mg total) by mouth as needed for migraine or headache. May repeat in 2 hours if headache persists or recurs. 07/24/22   Glyn Ade, Scot Jun, PA-C  fluconazole (DIFLUCAN) 150 MG tablet Take 1 tablet (150 mg total) by mouth every 3 (three) days for 3 doses. 11/25/22 12/02/22 Yes Becky Augusta, NP  leflunomide (ARAVA) 20 MG tablet Take 20 mg by mouth daily. 09/05/21   [provider]  magic mouthwash (lidocaine, diphenhydrAMINE, alum & mag hydroxide) suspension Swish and spit 5 mLs 4 (four) times daily as needed for mouth pain. 11/25/22  Yes Becky Augusta, NP  metoCLOPramide (REGLAN) 10 MG tablet Take 1 tablet (10 mg total) by mouth every 6 (six) hours as needed for nausea. 03/01/20   Glyn Ade, Scot Jun, PA-C  metoprolol succinate (TOPROL XL) 50 MG 24 hr tablet Take 1 tablet (50 mg total) by mouth daily. Take with or immediately following a meal. 07/24/22   Glyn Ade, Scot Jun, PA-C  pantoprazole (PROTONIX) 40 MG tablet Take 1 tablet (40 mg total) by mouth daily. 07/22/22   Midge Minium, MD  promethazine (PHENERGAN) 25 MG tablet TAKE ONE TABLET BY MOUTH EVERY 6 HOURS AS NEEDED FOR NAUSEA AND HEADACHE 07/24/22   Glyn Ade, Scot Jun, PA-C  Rimegepant Sulfate (NURTEC) 75 MG TBDP Take 1 tablet (75 mg total) by mouth every other day. 07/24/22   Glyn Ade, Scot Jun, PA-C  traMADol (ULTRAM) 50 MG tablet Take 50 mg by mouth every 8 (eight) hours as needed. 09/24/21   [provider]  tranexamic acid (LYSTEDA) 650 MG TABS tablet Tranexamic acid 2 tablets po every 8 hours x 5 days prn bleeding and as directed for procedures 11/02/21   [provider]  VENTOLIN HFA 108 (90 Base) MCG/ACT inhaler Inhale into the lungs. 11/26/21    [provider]    Family History Family History  Problem Relation Age of Onset   Arthritis Mother    Thyroid disease Maternal Grandmother    Lupus Maternal Grandmother     Social History Social History   Tobacco Use   Smoking status: Never   Smokeless tobacco: Never  Vaping Use   Vaping status: Never Used  Substance Use Topics   Alcohol use: Yes    Comment: Rare   Drug use: No     Allergies   Aspirin, Nsaids, Onion, Silicone, Oxycodone, and Wound dressing adhesive   Review  of Systems Review of Systems  Constitutional:  Negative for fever.  HENT:  Positive for sore throat. Negative for congestion, ear pain and rhinorrhea.   Respiratory:  Negative for cough.   Genitourinary:  Positive for vaginal pain. Negative for vaginal discharge.       Vaginal itching     Physical Exam Triage Vital Signs ED Triage Vitals [11/25/22 0910]  Encounter Vitals Group     BP      Systolic BP Percentile      Diastolic BP Percentile      Pulse      Resp      Temp      Temp src      SpO2      Weight      Height      Head Circumference      Peak Flow      Pain Score 4     Pain Loc      Pain Education      Exclude from Growth Chart    No data found.  Updated Vital Signs BP 123/88 (BP Location: Left Arm)   Pulse 70   Temp 98.6 F (37 C) (Oral)   Resp 16   LMP 03/14/2018 (Exact Date)   SpO2 100%   Visual Acuity Right Eye Distance:   Left Eye Distance:   Bilateral Distance:    Right Eye Near:   Left Eye Near:    Bilateral Near:     Physical Exam Vitals and nursing note reviewed.  Constitutional:      Appearance: Normal appearance. She is not ill-appearing.  HENT:     Head: Normocephalic and atraumatic.     Mouth/Throat:     Mouth: Mucous membranes are moist.     Pharynx: Oropharynx is clear. Posterior oropharyngeal erythema present. No oropharyngeal exudate.     Comments: Patient has marked erythema of her soft palate and uvula but no ulcerations.   No lesions on the roof of the mouth, tongue, or lips. Musculoskeletal:     Cervical back: Normal range of motion and neck supple.  Lymphadenopathy:     Cervical: No cervical adenopathy.  Skin:    General: Skin is warm and dry.     Capillary Refill: Capillary refill takes less than 2 seconds.  Neurological:     General: No focal deficit present.     Mental Status: She is alert and oriented to person, place, and time.      UC Treatments / Results  Labs (all labs ordered are listed, but only abnormal results are displayed) Labs Reviewed  GROUP A STREP BY PCR  WET PREP, GENITAL    EKG   Radiology No results found.  Procedures Procedures (including critical care time)  Medications Ordered in UC Medications - No data to display  Initial Impression / Assessment and Plan / UC Course  I have reviewed the triage vital signs and the nursing notes.  Pertinent labs & imaging results that were available during my care of the patient were reviewed by me and considered in my medical decision making (see chart for details).   Patient is a pleasant, nontoxic-appearing 38 year old female presenting for evaluation of sore throat and vaginal itching as outlined HPI above.  She has had a tonsillectomy but she has marked erythema to her soft palate and uvula.  There is no ulcer formation or exudate appreciated.  No other mouth, tongue, or lip lesions noted.  No cervical lymphadenopathy.  These did  not appear to be normal canker sores.  It is possible to be a viral process but also possibly strep, despite lack of fever.  I will order strep PCR to evaluate for the presence of strep.  Additionally, she has vaginal itching that started today but no discharge or urinary symptoms.  I will order a vaginal wet prep to evaluate her vaginal itching.  Vaginal wet prep is negative for yeast, trichomonas, or clue cells.  Strep PCR is negative.  I will discharge patient on the diagnosis of pharyngitis and  prescribed Magic mouthwash that she can use 4 times daily for pain relief and comfort.  She may also use over-the-counter Tylenol and/or ibuprofen.  Her wet prep is not positive for yeast though she is requesting Diflucan.  She reports that her symptoms feel similar to early onset yeast infection so I will send over prescription for Diflucan as well.   Final Clinical Impressions(s) / UC Diagnoses   Final diagnoses:  Acute pharyngitis, unspecified etiology  Vaginal itching     Discharge Instructions      Your strep test today was negative.    Your sore throat may be a result of a respiratory virus.  Use over-the-counter Tylenol and/or ibuprofen according to the package instructions as needed for pain.  You may also gargle with salt water, or use over-the-counter Chloraseptic or Sucrets lozenges to aid in pain relief.  I have prescribed Magic mouthwash that you can gargle and spit with 4 times a day as needed.  Typically before meals and at bedtime.  Your vaginal swab was also negative for yeast but I am going to send over prescription for Diflucan to the pharmacy.  Take 1 tablet now and repeat dosing every 3 days for total of 3 doses.  If you develop any new or worsening symptoms please return for reevaluation or see your PCP.         ED Prescriptions     Medication Sig Dispense Auth. Provider   fluconazole (DIFLUCAN) 150 MG tablet Take 1 tablet (150 mg total) by mouth every 3 (three) days for 3 doses. 3 tablet Becky Augusta, NP   magic mouthwash (lidocaine, diphenhydrAMINE, alum & mag hydroxide) suspension Swish and spit 5 mLs 4 (four) times daily as needed for mouth pain. 360 mL Becky Augusta, NP      PDMP not reviewed this encounter.   Becky Augusta, NP 11/25/22 1008

## 2022-11-25 NOTE — ED Triage Notes (Signed)
Pt presents with sores in her mouth x 1 week.

## 2022-12-14 ENCOUNTER — Other Ambulatory Visit: Payer: Self-pay | Admitting: Obstetrics & Gynecology

## 2022-12-14 DIAGNOSIS — G43009 Migraine without aura, not intractable, without status migrainosus: Secondary | ICD-10-CM

## 2022-12-18 ENCOUNTER — Encounter: Payer: Self-pay | Admitting: Physician Assistant

## 2022-12-18 ENCOUNTER — Ambulatory Visit: Payer: Medicaid Other | Admitting: Physician Assistant

## 2022-12-18 VITALS — BP 126/87 | HR 73 | Wt 195.0 lb

## 2022-12-18 DIAGNOSIS — G43701 Chronic migraine without aura, not intractable, with status migrainosus: Secondary | ICD-10-CM | POA: Diagnosis not present

## 2022-12-18 DIAGNOSIS — G43001 Migraine without aura, not intractable, with status migrainosus: Secondary | ICD-10-CM | POA: Diagnosis not present

## 2022-12-18 DIAGNOSIS — M62838 Other muscle spasm: Secondary | ICD-10-CM | POA: Diagnosis not present

## 2022-12-18 DIAGNOSIS — M5481 Occipital neuralgia: Secondary | ICD-10-CM | POA: Diagnosis not present

## 2022-12-18 NOTE — Progress Notes (Signed)
CC: Follow up migraine, Has had migraine since Monday  Right hand occipital/lower  region  Eyes are painful    History:  Tina Holder is a 37 y.o. [id number] who presents to clinic today for headache management.  She has had a consistent, severe migraine for 5 days now.  She has used all of her medications at some point and without complete relief.  She has not combined medications for fear of interaction.  She has used 50mg  benadryl and gone to sleep but then woken with headache still.  She is using ice, massage, shower with hot water, Nurtec, Relpax, Promethazine, Flexeril, Benadryl, Fioricet acutely.     Past Medical History:  Diagnosis Date   Arthritis    lower back, hands   Asthma    Bleeding disorder (HCC)    Sees [facility].  Takes 5 days Lysteda prior to surgeries.   Collagen vascular disease (HCC)    GERD (gastroesophageal reflux disease)    Headache(784.0)    Migraines 2x/mo.   Obesity    Pregnancy induced hypertension 2009, 2017   resolved after pregnancy   SVD (spontaneous vaginal delivery)    x 4   Wears contact lenses     Social History   Socioeconomic History   Marital status: Married    Spouse name: Not on file   Number of children: 4   Years of education: Not on file   Highest education level: Not on file  Occupational History   Not on file  Tobacco Use   Smoking status: Never   Smokeless tobacco: Never  Vaping Use   Vaping status: Never Used  Substance and Sexual Activity   Alcohol use: Yes    Comment: Rare   Drug use: No   Sexual activity: Not Currently    Birth control/protection: None, Surgical    Comment: tubal  Other Topics Concern   Not on file  Social History Narrative   Not on file   Social Determinants of Health   Financial Resource Strain: Not on file  Food Insecurity: Not on file  Transportation Needs: Not on file  Physical Activity: Not on file  Stress: Not on file  Social Connections: Not on file  Intimate Partner  Violence: Not on file    Family History  Problem Relation Age of Onset   Arthritis Mother    Thyroid disease Maternal Grandmother    Lupus Maternal Grandmother     Allergies  Allergen Reactions   Aspirin     Other reaction(s): Other (See Comments) ASA contraindicated with bleeding disorder    Nsaids     Other reaction(s): Other (See Comments) NSAIDS contraindicated with bleeding disorder.    Onion     Migraines   Silicone Other (See Comments)    Pt unaware of issue   Oxycodone Nausea And Vomiting   Wound Dressing Adhesive Itching and Rash    Other reaction(s):     Current Outpatient Medications on File Prior to Visit  Medication Sig Dispense Refill   atomoxetine (STRATTERA) 60 MG capsule Take 60 mg by mouth every morning.     Butalbital-APAP-Caffeine 50-325-40 MG capsule TAKE ONE TO TWO CAPSULES BY MOUTH EVERY 6 HOURS AS NEEDED FOR HEADACHE 20 capsule 2   colestipol (COLESTID) 1 g tablet Take 1 tablet (1 g total) by mouth 2 (two) times daily. 60 tablet 5   cyclobenzaprine (FLEXERIL) 10 MG tablet Take 1 tablet (10 mg total) by mouth at bedtime as needed for muscle spasms. 30  tablet 5   eletriptan (RELPAX) 40 MG tablet Take 1 tablet (40 mg total) by mouth as needed for migraine or headache. May repeat in 2 hours if headache persists or recurs. 10 tablet 11   EMGALITY 120 MG/ML SOAJ Inject 1 mL into the skin every 30 (thirty) days.     leflunomide (ARAVA) 20 MG tablet Take 20 mg by mouth daily.     magic mouthwash (lidocaine, diphenhydrAMINE, alum & mag hydroxide) suspension Swish and spit 5 mLs 4 (four) times daily as needed for mouth pain. 360 mL 0   metoCLOPramide (REGLAN) 10 MG tablet Take 1 tablet (10 mg total) by mouth every 6 (six) hours as needed for nausea. 30 tablet 1   metoprolol succinate (TOPROL XL) 50 MG 24 hr tablet Take 1 tablet (50 mg total) by mouth daily. Take with or immediately following a meal. 30 tablet 5   pantoprazole (PROTONIX) 40 MG tablet Take 1  tablet (40 mg total) by mouth daily. 30 tablet 5   promethazine (PHENERGAN) 25 MG tablet TAKE ONE TABLET BY MOUTH EVERY 6 HOURS AS NEEDED FOR NAUSEA AND HEADACHE 20 tablet 1   Rimegepant Sulfate (NURTEC) 75 MG TBDP Take 1 tablet (75 mg total) by mouth every other day. 16 tablet 11   traMADol (ULTRAM) 50 MG tablet Take 50 mg by mouth every 8 (eight) hours as needed.     tranexamic acid (LYSTEDA) 650 MG TABS tablet Tranexamic acid 2 tablets po every 8 hours x 5 days prn bleeding and as directed for procedures     VENTOLIN HFA 108 (90 Base) MCG/ACT inhaler Inhale into the lungs.     No current facility-administered medications on file prior to visit.     Review of Systems:  All pertinent positive/negative included in HPI, all other review of systems are negative   Objective:  Physical Exam BP 126/87   Pulse 73   Wt 195 lb (88.5 kg)   LMP 03/14/2018 (Exact Date)   BMI 35.67 kg/m  CONSTITUTIONAL: Well-developed, well-nourished female in no acute distress.  EYES: EOM intact HEAD: tenderness over bilat occipital nerves, lesser and greater ENT: Normocephalic CARDIOVASCULAR: Regular rate  RESPIRATORY: Normal rate.   MUSCULOSKELETAL: Normal ROM, strength equal bilaterally, significant muscle spasm noted throughout neck/shoulders/upper back SKIN: Warm, dry without erythema  NEUROLOGICAL: Alert, oriented, CN II-XII grossly intact, Appropriate balance PSYCH: Normal behavior, mood  PROCEDURE:  TRIGGER POINT INJECTIONS  Procedure: Mixture of 1%  Lidocaine, marcaine and dexamethazone in a ratio of 2:2:1  injected with 1cc each site in bilateral greater Occipital Nerves, bilateral lesser occipital nerves, bilateral cervical paraspinal muscles, bilateral trapezius.  Total amount injected: 14cc.  Pt tolerated procedure well.    Assessment & Plan:  Assessment: 1. Migraine without aura and with status migrainosus, not intractable   2. Chronic migraine without aura with status migrainosus, not  intractable   3. Muscle spasm   4. Bilateral occipital neuralgia      Plan: Injections done today to address acute pain.  Use ice.  Consider seeing chiropractor.   May consider removing emgality as it may not be providing benefit, particularly with nurtec now on board. Follow-up in 3 months or sooner PRN  Bertram Denver, PA-C 12/18/2022 10:32 AM

## 2023-01-01 ENCOUNTER — Ambulatory Visit
Admission: RE | Admit: 2023-01-01 | Discharge: 2023-01-01 | Disposition: A | Discharge status: Home / Self Care | Payer: Medicaid Other | Source: Ambulatory Visit

## 2023-01-01 VITALS — BP 128/86 | HR 87 | Temp 98.2°F | Resp 18

## 2023-01-01 DIAGNOSIS — S50362A Insect bite (nonvenomous) of left elbow, initial encounter: Secondary | ICD-10-CM

## 2023-01-01 DIAGNOSIS — L03114 Cellulitis of left upper limb: Secondary | ICD-10-CM

## 2023-01-01 MED ORDER — DOXYCYCLINE HYCLATE 100 MG PO CAPS: 100.0000 mg | ORAL_CAPSULE | Freq: Two times a day (BID) | ORAL | 0 refills | Status: AC

## 2023-01-01 MED ORDER — FLUCONAZOLE 150 MG PO TABS: 150.0000 mg | ORAL_TABLET | Freq: Every day | ORAL | 0 refills | Status: DC

## 2023-01-01 NOTE — ED Triage Notes (Signed)
Patient to Urgent Care with complaints of a sore present to her left elbow.  Reports elbow is swollen/ warm to the touch/ very tender. Reports symptoms started three days ago. Denies any injury.  Denies any fevers. No otc medications.

## 2023-01-01 NOTE — ED Provider Notes (Signed)
[facility]    CSN: [id number] Arrival date & time: 01/01/23  1455      History   Chief Complaint Chief Complaint  Patient presents with   Extremity Pain    I have a sore on my elbow, swollen slightly, warm to touch, very tender. - Entered by patient    HPI Tina Holder is a 37 y.o. female.  Accompanied by her husband, patient presents with 3-day history of redness, pain, swelling of her left elbow surrounding what she believes may be an insect bite.  She denies fever, chills, numbness, weakness, or other symptoms.  No OTC medications taken.  Her medical history includes asthma, migraine headaches, allergies, GERD, bleeding disorder.  The history is provided by the patient, the spouse and medical records.    Past Medical History:  Diagnosis Date   Arthritis    lower back, hands   Asthma    Bleeding disorder (HCC)    Sees [facility].  Takes 5 days Lysteda prior to surgeries.   Collagen vascular disease (HCC)    GERD (gastroesophageal reflux disease)    Headache(784.0)    Migraines 2x/mo.   Obesity    Pregnancy induced hypertension 2009, 2017   resolved after pregnancy   SVD (spontaneous vaginal delivery)    x 4   Wears contact lenses     Patient Active Problem List   Diagnosis Date Noted   Bleeding disorder (HCC)    Chronic migraine without aura 12/12/2021   Migraine without aura and with status migrainosus, not intractable 09/08/2019   Migraine without aura and without status migrainosus, not intractable 05/12/2019   Muscle spasm 09/06/2015   GERD (gastroesophageal reflux disease) 11/25/2011   Asthma 09/22/2011   Migraine headache 06/10/2011   Allergic rhinitis 06/01/2010    Past Surgical History:  Procedure Laterality Date   ABDOMINAL HYSTERECTOMY     BALLOON DILATION N/A 11/26/2020   Procedure: BALLOON DILATION;  Surgeon: Lanelle Bal, DO;  Location: [facility];  Service: Endoscopy;  Laterality: N/A;   BIOPSY  11/26/2020    Procedure: BIOPSY;  Surgeon: Lanelle Bal, DO;  Location: [facility];  Service: Endoscopy;;   ESOPHAGOGASTRODUODENOSCOPY (EGD) WITH PROPOFOL N/A 11/26/2020   Procedure: ESOPHAGOGASTRODUODENOSCOPY (EGD) WITH PROPOFOL;  Surgeon: Lanelle Bal, DO;  Location: [facility];  Service: Endoscopy;  Laterality: N/A;  8:30am   LAPAROSCOPY N/A 04/15/2018   Procedure: DIAGNOSTIC LAPAROSCOPY WITH EVACUATION OF HEMATOMA;  Surgeon: Reva Bores, MD;  Location: [facility];  Service: Gynecology;  Laterality: N/A;   REPAIR VAGINAL CUFF N/A 04/15/2018   Procedure: REPAIR VAGINAL CUFF;  Surgeon: Reva Bores, MD;  Location: [facility];  Service: Gynecology;  Laterality: N/A;   TONSILLECTOMY  2005   TUBAL LIGATION  01/01/2012   Procedure: POST PARTUM TUBAL LIGATION;  Surgeon: Willodean Rosenthal, MD;  Location: [facility];  Service: Gynecology;  Laterality: Bilateral;  with filshie clips   VAGINAL HYSTERECTOMY Right 04/12/2018   Procedure: HYSTERECTOMY VAGINAL WITH RIGHT SALPINGECTOMY;  Surgeon: Reva Bores, MD;  Location: [facility];  Service: Gynecology;  Laterality: Right;   WISDOM TOOTH EXTRACTION      OB History     Gravida  4   Para  4   Term  4   Preterm  0   AB  0   Living  4      SAB  0   IAB  0   Ectopic  0   Multiple  0  Live Births  4            Home Medications    Prior to Admission medications   Medication Sig Start Date End Date Taking? Authorizing Provider  cloNIDine (CATAPRES) 0.1 MG tablet Take 0.1 mg by mouth 2 (two) times daily. 08/25/22  Yes [provider]  doxycycline (VIBRAMYCIN) 100 MG capsule Take 1 capsule (100 mg total) by mouth 2 (two) times daily for 7 days. 01/01/23 01/08/23 Yes Mickie Bail, NP  fluconazole (DIFLUCAN) 150 MG tablet Take 1 tablet (150 mg total) by mouth daily. Take as directed. 01/01/23  Yes Mickie Bail, NP  atomoxetine (STRATTERA) 60 MG capsule Take 60 mg by mouth every morning. 11/04/22   [provider]   Butalbital-APAP-Caffeine 50-325-40 MG capsule TAKE ONE TO TWO CAPSULES BY MOUTH EVERY 6 HOURS AS NEEDED FOR HEADACHE 07/16/22   Anyanwu, Jethro Bastos, MD  colestipol (COLESTID) 1 g tablet Take 1 tablet (1 g total) by mouth 2 (two) times daily. 07/30/22   Midge Minium, MD  cyclobenzaprine (FLEXERIL) 10 MG tablet Take 1 tablet (10 mg total) by mouth at bedtime as needed for muscle spasms. 07/24/22   Glyn Ade, Scot Jun, PA-C  eletriptan (RELPAX) 40 MG tablet Take 1 tablet (40 mg total) by mouth as needed for migraine or headache. May repeat in 2 hours if headache persists or recurs. 07/24/22   Teague Chestine Spore, Scot Jun, PA-C  EMGALITY 120 MG/ML SOAJ Inject 1 mL into the skin every 30 (thirty) days. 11/16/22   [provider]  leflunomide (ARAVA) 20 MG tablet Take 20 mg by mouth daily. 09/05/21   [provider]  magic mouthwash (lidocaine, diphenhydrAMINE, alum & mag hydroxide) suspension Swish and spit 5 mLs 4 (four) times daily as needed for mouth pain. 11/25/22   Becky Augusta, NP  metoCLOPramide (REGLAN) 10 MG tablet Take 1 tablet (10 mg total) by mouth every 6 (six) hours as needed for nausea. 03/01/20   Glyn Ade, Scot Jun, PA-C  metoprolol succinate (TOPROL XL) 50 MG 24 hr tablet Take 1 tablet (50 mg total) by mouth daily. Take with or immediately following a meal. 07/24/22   Glyn Ade, Scot Jun, PA-C  pantoprazole (PROTONIX) 40 MG tablet Take 1 tablet (40 mg total) by mouth daily. 07/22/22   Midge Minium, MD  promethazine (PHENERGAN) 25 MG tablet TAKE ONE TABLET BY MOUTH EVERY 6 HOURS AS NEEDED FOR NAUSEA AND HEADACHE 07/24/22   Glyn Ade, Scot Jun, PA-C  Rimegepant Sulfate (NURTEC) 75 MG TBDP Take 1 tablet (75 mg total) by mouth every other day. 07/24/22   Glyn Ade, Scot Jun, PA-C  traMADol (ULTRAM) 50 MG tablet Take 50 mg by mouth every 8 (eight) hours as needed. 09/24/21   [provider]  tranexamic acid (LYSTEDA) 650 MG TABS tablet Tranexamic acid 2 tablets po every 8 hours x  5 days prn bleeding and as directed for procedures 11/02/21   [provider]  VENTOLIN HFA 108 (90 Base) MCG/ACT inhaler Inhale into the lungs. 11/26/21   [provider]    Family History Family History  Problem Relation Age of Onset   Arthritis Mother    Thyroid disease Maternal Grandmother    Lupus Maternal Grandmother     Social History Social History   Tobacco Use   Smoking status: Never   Smokeless tobacco: Never  Vaping Use   Vaping status: Never Used  Substance Use Topics   Alcohol use: Yes  Comment: Rare   Drug use: No     Allergies   Aspirin, Nsaids, Onion, Silicone, Oxycodone, and Wound dressing adhesive   Review of Systems Review of Systems  Constitutional:  Negative for chills and fever.  Musculoskeletal:  Positive for arthralgias and joint swelling.  Skin:  Positive for color change and wound.  Neurological:  Negative for weakness and numbness.     Physical Exam Triage Vital Signs ED Triage Vitals  Encounter Vitals Group     BP      Systolic BP Percentile      Diastolic BP Percentile      Pulse      Resp      Temp      Temp src      SpO2      Weight      Height      Head Circumference      Peak Flow      Pain Score      Pain Loc      Pain Education      Exclude from Growth Chart    No data found.  Updated Vital Signs BP 128/86   Pulse 87   Temp 98.2 F (36.8 C)   Resp 18   LMP 03/14/2018 (Exact Date)   SpO2 97%   Visual Acuity Right Eye Distance:   Left Eye Distance:   Bilateral Distance:    Right Eye Near:   Left Eye Near:    Bilateral Near:     Physical Exam Constitutional:      General: She is not in acute distress. HENT:     Mouth/Throat:     Mouth: Mucous membranes are moist.  Cardiovascular:     Rate and Rhythm: Normal rate and regular rhythm.  Pulmonary:     Effort: Pulmonary effort is normal. No respiratory distress.  Musculoskeletal:        General: Swelling and tenderness present. No  deformity. Normal range of motion.  Skin:    Findings: Erythema and lesion present.     Comments: 0.5 cm raised papule on left forearm just below the elbow which is tender to palpation and has surrounding mild erythema and mild edema.  No open wound or drainage.  Neurological:     General: No focal deficit present.     Mental Status: She is alert and oriented to person, place, and time.     Sensory: No sensory deficit.     Motor: No weakness.  Psychiatric:        Mood and Affect: Mood normal.        Behavior: Behavior normal.      UC Treatments / Results  Labs (all labs ordered are listed, but only abnormal results are displayed) Labs Reviewed - No data to display  EKG   Radiology No results found.  Procedures Procedures (including critical care time)  Medications Ordered in UC Medications - No data to display  Initial Impression / Assessment and Plan / UC Course  I have reviewed the triage vital signs and the nursing notes.  Pertinent labs & imaging results that were available during my care of the patient were reviewed by me and considered in my medical decision making (see chart for details).    Cellulitis of left elbow, insect bite of left elbow.  Afebrile and vital signs are stable.  Patient declines transfer to the ED.  Treating with doxycycline.  Per patient request, 1 tablet of Diflucan also prescribed as  she reports she gets yeast infection when taking antibiotics.  Instructed her to follow-up with her PCP on Monday.  Strict ED precautions discussed.  Education provided on cellulitis.  Patient agrees to plan of care.  Final Clinical Impressions(s) / UC Diagnoses   Final diagnoses:  Cellulitis of left elbow  Insect bite of left elbow, initial encounter     Discharge Instructions      Take the doxycycline and Diflucan as directed.  Follow up with your primary care provider tomorrow.  Go to the emergency department if you have worsening symptoms.         ED Prescriptions     Medication Sig Dispense Auth. Provider   doxycycline (VIBRAMYCIN) 100 MG capsule Take 1 capsule (100 mg total) by mouth 2 (two) times daily for 7 days. 14 capsule Mickie Bail, NP   fluconazole (DIFLUCAN) 150 MG tablet Take 1 tablet (150 mg total) by mouth daily. Take as directed. 1 tablet Mickie Bail, NP      PDMP not reviewed this encounter.   Mickie Bail, NP 01/01/23 [phone]

## 2023-01-01 NOTE — Discharge Instructions (Addendum)
Take the doxycycline and Diflucan as directed.  Follow up with your primary care provider tomorrow.  Go to the emergency department if you have worsening symptoms.

## 2023-01-09 ENCOUNTER — Other Ambulatory Visit: Payer: Self-pay | Admitting: Gastroenterology

## 2023-01-10 ENCOUNTER — Ambulatory Visit
Admission: RE | Admit: 2023-01-10 | Discharge: 2023-01-10 | Disposition: A | Discharge status: Home / Self Care | Payer: Medicaid Other | Source: Ambulatory Visit | Attending: Emergency Medicine | Admitting: Emergency Medicine

## 2023-01-10 VITALS — BP 137/83 | HR 86 | Temp 97.9°F | Ht 62.0 in | Wt 190.0 lb

## 2023-01-10 DIAGNOSIS — L03114 Cellulitis of left upper limb: Secondary | ICD-10-CM | POA: Diagnosis not present

## 2023-01-10 MED ORDER — DOXYCYCLINE HYCLATE 100 MG PO CAPS: 100.0000 mg | ORAL_CAPSULE | Freq: Two times a day (BID) | ORAL | 0 refills | Status: DC

## 2023-01-10 NOTE — ED Provider Notes (Signed)
Renaldo Fiddler    CSN: [id number] Arrival date & time: 01/10/23  4098      History   Chief Complaint Chief Complaint  Patient presents with   Arm Injury    CellituitesCame in a week ago, it was getting better but starting to hurt again . - Entered by patient    HPI Tina Holder is a 37 y.o. female.   Patient presents for reevaluation of infection to the left elbow that began 13 days ago from possible insect bite.  Was seen in this urgent care 9 days ago and treated for infection, has been taking doxycycline as directed and symptoms have improved.  Endorses that 1 day ago she began to have tenderness and mild swelling.  Denies presence of fever or drainage.  Has 1 tablet left of antibiotic.  Past Medical History:  Diagnosis Date   Arthritis    lower back, hands   Asthma    Bleeding disorder (HCC)    Sees [facility].  Takes 5 days Lysteda prior to surgeries.   Collagen vascular disease (HCC)    GERD (gastroesophageal reflux disease)    Headache(784.0)    Migraines 2x/mo.   Obesity    Pregnancy induced hypertension 2009, 2017   resolved after pregnancy   SVD (spontaneous vaginal delivery)    x 4   Wears contact lenses     Patient Active Problem List   Diagnosis Date Noted   Bleeding disorder (HCC)    Chronic migraine without aura 12/12/2021   Migraine without aura and with status migrainosus, not intractable 09/08/2019   Migraine without aura and without status migrainosus, not intractable 05/12/2019   Muscle spasm 09/06/2015   GERD (gastroesophageal reflux disease) 11/25/2011   Asthma 09/22/2011   Migraine headache 06/10/2011   Allergic rhinitis 06/01/2010    Past Surgical History:  Procedure Laterality Date   ABDOMINAL HYSTERECTOMY     BALLOON DILATION N/A 11/26/2020   Procedure: BALLOON DILATION;  Surgeon: Lanelle Bal, DO;  [facility]: [facility];  Service: Endoscopy;  Laterality: N/A;   BIOPSY  11/26/2020   Procedure: BIOPSY;  Surgeon:  Lanelle Bal, DO;  [facility];  Service: Endoscopy;;   ESOPHAGOGASTRODUODENOSCOPY (EGD) WITH PROPOFOL N/A 11/26/2020   Procedure: ESOPHAGOGASTRODUODENOSCOPY (EGD) WITH PROPOFOL;  Surgeon: Lanelle Bal, DO;  [facility];  Service: Endoscopy;  Laterality: N/A;  8:30am   LAPAROSCOPY N/A 04/15/2018   Procedure: DIAGNOSTIC LAPAROSCOPY WITH EVACUATION OF HEMATOMA;  Surgeon: Reva Bores, MD;  Location: [facility];  Service: Gynecology;  Laterality: N/A;   REPAIR VAGINAL CUFF N/A 04/15/2018   Procedure: REPAIR VAGINAL CUFF;  Surgeon: Reva Bores, MD;  Location: [facility];  Service: Gynecology;  Laterality: N/A;   TONSILLECTOMY  2005   TUBAL LIGATION  01/01/2012   Procedure: POST PARTUM TUBAL LIGATION;  Surgeon: Willodean Rosenthal, MD;  Location: [facility];  Service: Gynecology;  Laterality: Bilateral;  with filshie clips   VAGINAL HYSTERECTOMY Right 04/12/2018   Procedure: HYSTERECTOMY VAGINAL WITH RIGHT SALPINGECTOMY;  Surgeon: Reva Bores, MD;  Location: [facility];  Service: Gynecology;  Laterality: Right;   WISDOM TOOTH EXTRACTION      OB History     Gravida  4   Para  4   Term  4   Preterm  0   AB  0   Living  4      SAB  0   IAB  0   Ectopic  0  Multiple  0   Live Births  4            Home Medications    Prior to Admission medications   Medication Sig Start Date End Date Taking? Authorizing Provider  doxycycline (VIBRAMYCIN) 100 MG capsule Take 1 capsule (100 mg total) by mouth 2 (two) times daily. 01/10/23  Yes Rheagan Nayak, Hansel Starling R, NP  atomoxetine (STRATTERA) 60 MG capsule Take 60 mg by mouth every morning. 11/04/22   [provider]  Butalbital-APAP-Caffeine 50-325-40 MG capsule TAKE ONE TO TWO CAPSULES BY MOUTH EVERY 6 HOURS AS NEEDED FOR HEADACHE 07/16/22   Anyanwu, Jethro Bastos, MD  cloNIDine (CATAPRES) 0.1 MG tablet Take 0.1 mg by mouth 2 (two) times daily. 08/25/22   [provider]  colestipol (COLESTID) 1 g  tablet Take 1 tablet (1 g total) by mouth 2 (two) times daily. 07/30/22   Midge Minium, MD  cyclobenzaprine (FLEXERIL) 10 MG tablet Take 1 tablet (10 mg total) by mouth at bedtime as needed for muscle spasms. 07/24/22   Glyn Ade, Scot Jun, PA-C  eletriptan (RELPAX) 40 MG tablet Take 1 tablet (40 mg total) by mouth as needed for migraine or headache. May repeat in 2 hours if headache persists or recurs. 07/24/22   Teague Chestine Spore, Scot Jun, PA-C  EMGALITY 120 MG/ML SOAJ Inject 1 mL into the skin every 30 (thirty) days. 11/16/22   [provider]  fluconazole (DIFLUCAN) 150 MG tablet Take 1 tablet (150 mg total) by mouth daily. Take as directed. 01/01/23   Mickie Bail, NP  leflunomide (ARAVA) 20 MG tablet Take 20 mg by mouth daily. 09/05/21   [provider]  magic mouthwash (lidocaine, diphenhydrAMINE, alum & mag hydroxide) suspension Swish and spit 5 mLs 4 (four) times daily as needed for mouth pain. 11/25/22   Becky Augusta, NP  metoCLOPramide (REGLAN) 10 MG tablet Take 1 tablet (10 mg total) by mouth every 6 (six) hours as needed for nausea. 03/01/20   Glyn Ade, Scot Jun, PA-C  metoprolol succinate (TOPROL XL) 50 MG 24 hr tablet Take 1 tablet (50 mg total) by mouth daily. Take with or immediately following a meal. 07/24/22   Glyn Ade, Scot Jun, PA-C  pantoprazole (PROTONIX) 40 MG tablet Take 1 tablet (40 mg total) by mouth daily. 07/22/22   Midge Minium, MD  promethazine (PHENERGAN) 25 MG tablet TAKE ONE TABLET BY MOUTH EVERY 6 HOURS AS NEEDED FOR NAUSEA AND HEADACHE 07/24/22   Glyn Ade, Scot Jun, PA-C  Rimegepant Sulfate (NURTEC) 75 MG TBDP Take 1 tablet (75 mg total) by mouth every other day. 07/24/22   Glyn Ade, Scot Jun, PA-C  traMADol (ULTRAM) 50 MG tablet Take 50 mg by mouth every 8 (eight) hours as needed. 09/24/21   [provider]  tranexamic acid (LYSTEDA) 650 MG TABS tablet Tranexamic acid 2 tablets po every 8 hours x 5 days prn bleeding and as directed for  procedures 11/02/21   [provider]  VENTOLIN HFA 108 (90 Base) MCG/ACT inhaler Inhale into the lungs. 11/26/21   [provider]    Family History Family History  Problem Relation Age of Onset   Arthritis Mother    Thyroid disease Maternal Grandmother    Lupus Maternal Grandmother     Social History Social History   Tobacco Use   Smoking status: Never   Smokeless tobacco: Never  Vaping Use   Vaping status: Never Used  Substance Use Topics   Alcohol use: Yes  Comment: Rare   Drug use: No     Allergies   Aspirin, Nsaids, Onion, Silicone, Oxycodone, and Wound dressing adhesive   Review of Systems Review of Systems   Physical Exam Triage Vital Signs ED Triage Vitals  Encounter Vitals Group     BP 01/10/23 0940 137/83     Systolic BP Percentile --      Diastolic BP Percentile --      Pulse Rate 01/10/23 0940 86     Resp --      Temp 01/10/23 0940 97.9 F (36.6 C)     Temp src --      SpO2 01/10/23 0940 97 %     Weight 01/10/23 0946 190 lb (86.2 kg)     Height 01/10/23 0946 5\' 2"  (1.575 m)     Head Circumference --      Peak Flow --      Pain Score 01/10/23 0946 4     Pain Loc --      Pain Education --      Exclude from Growth Chart --    No data found.  Updated Vital Signs BP 137/83   Pulse 86   Temp 97.9 F (36.6 C)   Ht 5\' 2"  (1.575 m)   Wt 190 lb (86.2 kg)   LMP 03/14/2018 (Exact Date)   SpO2 97%   BMI 34.75 kg/m   Visual Acuity Right Eye Distance:   Left Eye Distance:   Bilateral Distance:    Right Eye Near:   Left Eye Near:    Bilateral Near:     Physical Exam Constitutional:      Appearance: Normal appearance.  Eyes:     Extraocular Movements: Extraocular movements intact.  Pulmonary:     Effort: Pulmonary effort is normal.  Skin:    Comments: Has erythematous papules present to the left elbow, surrounding tissue tender to palpation with mild swelling, no drainage noted, skin is cool to touch  Neurological:      Mental Status: She is alert and oriented to person, place, and time. Mental status is at baseline.      UC Treatments / Results  Labs (all labs ordered are listed, but only abnormal results are displayed) Labs Reviewed - No data to display  EKG   Radiology No results found.  Procedures Procedures (including critical care time)  Medications Ordered in UC Medications - No data to display  Initial Impression / Assessment and Plan / UC Course  I have reviewed the triage vital signs and the nursing notes.  Pertinent labs & imaging results that were available during my care of the patient were reviewed by me and considered in my medical decision making (see chart for details).  Cellulits of left elbow  Vital signs are stable, no signs of sepsis, as symptoms are beginning to recur we will extend antibiotic course for an additional 7 days, doxycycline sent to pharmacy, recommend over-the-counter analgesics and warm compresses for supportive care, may follow-up with his urgent care as needed however for significantly worsening symptoms she has been instructed to go to the nearest emergency department for evaluation of IV medications Final Clinical Impressions(s) / UC Diagnoses   Final diagnoses:  Cellulitis of left elbow     Discharge Instructions      The most likely flaring as the child is not fully resolved  Doreatha Martin current course of doxycycline, has been extended for an additional 7 days  May take Tylenol or Motrin as needed  for pain every 6 hours  May warm compresses to comfort and in a 15-minute intervals  Follow-up with his urgent care as needed however if symptoms significantly worsen please go to the nearest emergency department as you may need IV antibiotics   ED Prescriptions     Medication Sig Dispense Auth. Provider   doxycycline (VIBRAMYCIN) 100 MG capsule Take 1 capsule (100 mg total) by mouth 2 (two) times daily. 14 capsule Svetlana Bagby, Elita Boone, NP       PDMP not reviewed this encounter.   Valinda Hoar, NP 01/10/23 1010

## 2023-01-10 NOTE — ED Triage Notes (Signed)
Patient states she was seen last week for cellulitis in left elbow area that was getting better then seemed worse.

## 2023-01-10 NOTE — Discharge Instructions (Signed)
The most likely flaring as the child is not fully resolved  Tina Holder current course of doxycycline, has been extended for an additional 7 days  May take Tylenol or Motrin as needed for pain every 6 hours  May warm compresses to comfort and in a 15-minute intervals  Follow-up with his urgent care as needed however if symptoms significantly worsen please go to the nearest emergency department as you may need IV antibiotics

## 2023-01-15 ENCOUNTER — Encounter: Payer: Self-pay | Admitting: Gastroenterology

## 2023-01-23 ENCOUNTER — Other Ambulatory Visit: Payer: Self-pay | Admitting: Gastroenterology

## 2023-01-26 ENCOUNTER — Telehealth: Payer: Medicaid Other | Admitting: Gastroenterology

## 2023-01-26 DIAGNOSIS — K625 Hemorrhage of anus and rectum: Secondary | ICD-10-CM

## 2023-01-26 DIAGNOSIS — K219 Gastro-esophageal reflux disease without esophagitis: Secondary | ICD-10-CM

## 2023-01-26 DIAGNOSIS — R1310 Dysphagia, unspecified: Secondary | ICD-10-CM

## 2023-01-26 DIAGNOSIS — R1319 Other dysphagia: Secondary | ICD-10-CM

## 2023-01-26 NOTE — Progress Notes (Signed)
Midge Minium, MD [address]  [address]  [place], [place] [address]  Main: [phone]  Fax: [phone]    Gastroenterology Virtual/Video Visit  Referring Provider:     Samuella Bruin Primary Care Physician:  Samuella Bruin Primary Gastroenterologist:  Dr.Jhana Giarratano Servando Snare Reason for Consultation:     Rectal bleeding        HPI:    Virtual Visit via Video Note Location of the patient: Work Location of provider: Office Participating persons: The patient and myself.  I connected with Tina Holder on 01/26/23 at  1:15 PM EDT by a video enabled telemedicine application and verified that I am speaking with the correct person using two identifiers.   I discussed the limitations of evaluation and management by telemedicine and the availability of in person appointments. The patient expressed understanding and agreed to proceed.  Verbal consent to proceed obtained.  History of Present Illness: Tina Holder is a 37 y.o. female referred by Dr. Shawnie Dapper, PA-C  for consultation & management of rectal bleeding and was seen by me back in 2023.  The patient had been taking colestipol for biliary diarrhea and she was taking Nexium twice a day for her reflux and was switched to 40 mg of pantoprazole a day due to acid breakthrough.  The patient was also reporting recurrent dysphagia and was recommended to have an upper endoscopy.  The patient imaging at that time also showed her to have thickening of the antrum consistent with possible gastritis. The patient now recently called because she was having rectal bleeding with every bowel movement and stated that she believes she has hemorrhoids. The patient missed her last appointment for her EGD because she was sick.  She reports that the Protonix is working very well for her and much better than her previous PPI.   Past Medical History:  Diagnosis Date   Arthritis    lower back, hands   Asthma    Bleeding disorder  (HCC)    Sees [facility].  Takes 5 days Lysteda prior to surgeries.   Collagen vascular disease (HCC)    GERD (gastroesophageal reflux disease)    Headache(784.0)    Migraines 2x/mo.   Obesity    Pregnancy induced hypertension 2009, 2017   resolved after pregnancy   SVD (spontaneous vaginal delivery)    x 4   Wears contact lenses     Past Surgical History:  Procedure Laterality Date   ABDOMINAL HYSTERECTOMY     BALLOON DILATION N/A 11/26/2020   Procedure: BALLOON DILATION;  Surgeon: Lanelle Bal, DO;  [facility];  Service: Endoscopy;  Laterality: N/A;   BIOPSY  11/26/2020   Procedure: BIOPSY;  Surgeon: Lanelle Bal, DO;  [facility];  Service: Endoscopy;;   ESOPHAGOGASTRODUODENOSCOPY (EGD) WITH PROPOFOL N/A 11/26/2020   Procedure: ESOPHAGOGASTRODUODENOSCOPY (EGD) WITH PROPOFOL;  Surgeon: Lanelle Bal, DO;  [facility];  Service: Endoscopy;  Laterality: N/A;  8:30am   LAPAROSCOPY N/A 04/15/2018   Procedure: DIAGNOSTIC LAPAROSCOPY WITH EVACUATION OF HEMATOMA;  Surgeon: Reva Bores, MD;  Location: [facility];  Service: Gynecology;  Laterality: N/A;   REPAIR VAGINAL CUFF N/A 04/15/2018   Procedure: REPAIR VAGINAL CUFF;  Surgeon: Reva Bores, MD;  Location: [facility];  Service: Gynecology;  Laterality: N/A;   TONSILLECTOMY  2005   TUBAL LIGATION  01/01/2012   Procedure: POST PARTUM TUBAL LIGATION;  Surgeon: Willodean Rosenthal, MD;  [facility];  Service: Gynecology;  Laterality: Bilateral;  with filshie clips   VAGINAL HYSTERECTOMY Right 04/12/2018   Procedure: HYSTERECTOMY VAGINAL WITH RIGHT SALPINGECTOMY;  Surgeon: Reva Bores, MD;  Location: [facility];  Service: Gynecology;  Laterality: Right;   WISDOM TOOTH EXTRACTION      Prior to Admission medications   Medication Sig Start Date End Date Taking? Authorizing Provider  atomoxetine (STRATTERA) 60 MG capsule Take 60 mg by mouth every morning. 11/04/22   [provider]  Butalbital-APAP-Caffeine 50-325-40 MG capsule TAKE ONE TO TWO CAPSULES BY MOUTH EVERY 6 HOURS AS NEEDED FOR HEADACHE 07/16/22   Anyanwu, Jethro Bastos, MD  cloNIDine (CATAPRES) 0.1 MG tablet Take 0.1 mg by mouth 2 (two) times daily. 08/25/22   [provider]  colestipol (COLESTID) 1 g tablet Take 1 tablet (1 g total) by mouth 2 (two) times daily. 07/30/22   Midge Minium, MD  cyclobenzaprine (FLEXERIL) 10 MG tablet Take 1 tablet (10 mg total) by mouth at bedtime as needed for muscle spasms. 07/24/22   Glyn Ade, Scot Jun, PA-C  doxycycline (VIBRAMYCIN) 100 MG capsule Take 1 capsule (100 mg total) by mouth 2 (two) times daily. 01/10/23   White, Elita Boone, NP  eletriptan (RELPAX) 40 MG tablet Take 1 tablet (40 mg total) by mouth as needed for migraine or headache. May repeat in 2 hours if headache persists or recurs. 07/24/22   Teague Chestine Spore, Scot Jun, PA-C  EMGALITY 120 MG/ML SOAJ Inject 1 mL into the skin every 30 (thirty) days. 11/16/22   [provider]  fluconazole (DIFLUCAN) 150 MG tablet Take 1 tablet (150 mg total) by mouth daily. Take as directed. 01/01/23   Mickie Bail, NP  leflunomide (ARAVA) 20 MG tablet Take 20 mg by mouth daily. 09/05/21   [provider]  magic mouthwash (lidocaine, diphenhydrAMINE, alum & mag hydroxide) suspension Swish and spit 5 mLs 4 (four) times daily as needed for mouth pain. 11/25/22   Becky Augusta, NP  metoCLOPramide (REGLAN) 10 MG tablet Take 1 tablet (10 mg total) by mouth every 6 (six) hours as needed for nausea. 03/01/20   Glyn Ade, Scot Jun, PA-C  metoprolol succinate (TOPROL XL) 50 MG 24 hr tablet Take 1 tablet (50 mg total) by mouth daily. Take with or immediately following a meal. 07/24/22   Glyn Ade, Scot Jun, PA-C  pantoprazole (PROTONIX) 40 MG tablet TAKE 1 TABLET BY MOUTH DAILY 01/11/23   Midge Minium, MD  promethazine (PHENERGAN) 25 MG tablet TAKE ONE TABLET BY MOUTH EVERY 6 HOURS AS NEEDED FOR NAUSEA AND HEADACHE  07/24/22   Glyn Ade, Scot Jun, PA-C  Rimegepant Sulfate (NURTEC) 75 MG TBDP Take 1 tablet (75 mg total) by mouth every other day. 07/24/22   Glyn Ade, Scot Jun, PA-C  traMADol (ULTRAM) 50 MG tablet Take 50 mg by mouth every 8 (eight) hours as needed. 09/24/21   [provider]  tranexamic acid (LYSTEDA) 650 MG TABS tablet Tranexamic acid 2 tablets po every 8 hours x 5 days prn bleeding and as directed for procedures 11/02/21   [provider]  VENTOLIN HFA 108 (90 Base) MCG/ACT inhaler Inhale into the lungs. 11/26/21   [provider]    Family History  Problem Relation Age of Onset   Arthritis Mother    Thyroid disease Maternal Grandmother    Lupus Maternal Grandmother      Social History   Tobacco Use   Smoking status: Never   Smokeless  tobacco: Never  Vaping Use   Vaping status: Never Used  Substance Use Topics   Alcohol use: Yes    Comment: Rare   Drug use: No    Allergies as of 01/26/2023 - Review Complete 01/10/2023  Allergen Reaction Noted   Aspirin  06/18/2021   Nsaids  06/18/2021   Onion  08/17/2022   Silicone Other (See Comments) 09/11/2021   Oxycodone Nausea And Vomiting 09/23/2021   Wound dressing adhesive Itching and Rash 10/25/2019    Review of Systems:    All systems reviewed and negative except where noted in HPI.   Observations/Objective:  Labs: CBC    Component Value Date/Time   WBC 5.3 12/11/2021 1624   RBC 4.52 12/11/2021 1624   HGB 13.9 12/11/2021 1624   HGB 13.2 05/02/2018 1517   HCT 40.4 12/11/2021 1624   HCT 40.0 05/02/2018 1517   PLT 171 12/11/2021 1624   PLT 274 05/02/2018 1517   MCV 89.4 12/11/2021 1624   MCV 91 05/02/2018 1517   MCH 30.8 12/11/2021 1624   MCHC 34.4 12/11/2021 1624   RDW 12.1 12/11/2021 1624   RDW 13.7 05/02/2018 1517   LYMPHSABS 1.2 12/11/2021 1624   MONOABS 0.6 12/11/2021 1624   EOSABS 0.0 12/11/2021 1624   BASOSABS 0.0 12/11/2021 1624   CMP     Component Value Date/Time   NA  138 12/11/2021 1624   K 4.3 12/11/2021 1624   CL 106 12/11/2021 1624   CO2 27 12/11/2021 1624   GLUCOSE 90 12/11/2021 1624   BUN 19 12/11/2021 1624   CREATININE 0.89 12/11/2021 1624   CALCIUM 9.1 12/11/2021 1624   PROT 7.0 12/11/2021 1624   ALBUMIN 4.5 12/11/2021 1624   AST 20 12/11/2021 1624   ALT 19 12/11/2021 1624   ALKPHOS 61 12/11/2021 1624   BILITOT 0.2 (L) 12/11/2021 1624   GFRNONAA >60 12/11/2021 1624   GFRAA >60 04/14/2018 1322    Imaging Studies: No results found.  Assessment and Plan:   Tina Holder is a 37 y.o. y/o female has been referred for rectal bleeding with every bowel movement and intermittent dysphagia.  The patient reports that the dysphagia can happen with any types of food including water.  She has no worry symptoms such as unexplained weight loss.  The patient will be set up for an EGD due to her dysphagia and a colonoscopy due to her rectal bleeding.  The patient has been explained the plan agrees with it.  Follow Up Instructions:  I discussed the assessment and treatment plan with the patient. The patient was provided an opportunity to ask questions and all were answered. The patient agreed with the plan and demonstrated an understanding of the instructions.   The patient was advised to call back or seek an in-person evaluation if the symptoms worsen or if the condition fails to improve as anticipated.  I provided 20 minutes of non-face-to-face time during this encounter including chart review In preparation for the encounter.   Midge Minium, MD  Speech recognition software was used to dictate the above note.

## 2023-02-11 ENCOUNTER — Other Ambulatory Visit: Payer: Self-pay

## 2023-02-11 DIAGNOSIS — K625 Hemorrhage of anus and rectum: Secondary | ICD-10-CM

## 2023-02-11 DIAGNOSIS — R1319 Other dysphagia: Secondary | ICD-10-CM

## 2023-02-11 MED ORDER — NA SULFATE-K SULFATE-MG SULF 17.5-3.13-1.6 GM/177ML PO SOLN
1.0000 | Freq: Once | ORAL | 0 refills | Status: AC
Start: 2023-02-11 — End: 2023-02-11

## 2023-02-27 ENCOUNTER — Other Ambulatory Visit: Payer: Self-pay | Admitting: Gastroenterology

## 2023-03-19 ENCOUNTER — Encounter: Payer: Self-pay | Admitting: Gastroenterology

## 2023-03-19 NOTE — Telephone Encounter (Signed)
Can You all please call and set up appointment

## 2023-03-19 NOTE — Telephone Encounter (Signed)
Tina Holder scheduled appt

## 2023-03-30 ENCOUNTER — Encounter
Admission: RE | Disposition: A | Discharge status: Home / Self Care | Payer: Self-pay | Source: Home / Self Care | Attending: Gastroenterology

## 2023-03-30 ENCOUNTER — Ambulatory Visit: Payer: Medicaid Other | Admitting: Anesthesiology

## 2023-03-30 ENCOUNTER — Other Ambulatory Visit: Payer: Self-pay

## 2023-03-30 ENCOUNTER — Ambulatory Visit
Admission: RE | Admit: 2023-03-30 | Discharge: 2023-03-30 | Disposition: A | Discharge status: Home / Self Care | Payer: Medicaid Other | Attending: Gastroenterology | Admitting: Gastroenterology

## 2023-03-30 ENCOUNTER — Encounter: Payer: Self-pay | Admitting: Gastroenterology

## 2023-03-30 DIAGNOSIS — K921 Melena: Secondary | ICD-10-CM | POA: Insufficient documentation

## 2023-03-30 DIAGNOSIS — D123 Benign neoplasm of transverse colon: Secondary | ICD-10-CM | POA: Insufficient documentation

## 2023-03-30 DIAGNOSIS — D125 Benign neoplasm of sigmoid colon: Secondary | ICD-10-CM | POA: Insufficient documentation

## 2023-03-30 DIAGNOSIS — J45909 Unspecified asthma, uncomplicated: Secondary | ICD-10-CM | POA: Insufficient documentation

## 2023-03-30 DIAGNOSIS — K641 Second degree hemorrhoids: Secondary | ICD-10-CM | POA: Diagnosis not present

## 2023-03-30 DIAGNOSIS — K625 Hemorrhage of anus and rectum: Secondary | ICD-10-CM

## 2023-03-30 DIAGNOSIS — R1319 Other dysphagia: Secondary | ICD-10-CM

## 2023-03-30 DIAGNOSIS — K219 Gastro-esophageal reflux disease without esophagitis: Secondary | ICD-10-CM | POA: Insufficient documentation

## 2023-03-30 DIAGNOSIS — K635 Polyp of colon: Secondary | ICD-10-CM

## 2023-03-30 DIAGNOSIS — R1310 Dysphagia, unspecified: Secondary | ICD-10-CM | POA: Diagnosis not present

## 2023-03-30 HISTORY — PX: COLONOSCOPY WITH PROPOFOL: SHX5780

## 2023-03-30 HISTORY — PX: BIOPSY: SHX5522

## 2023-03-30 HISTORY — PX: POLYPECTOMY: SHX5525

## 2023-03-30 HISTORY — PX: ESOPHAGOGASTRODUODENOSCOPY (EGD) WITH PROPOFOL: SHX5813

## 2023-03-30 SURGERY — COLONOSCOPY WITH PROPOFOL
Anesthesia: General

## 2023-03-30 MED ORDER — PROPOFOL 10 MG/ML IV BOLUS
INTRAVENOUS | Status: DC | PRN
  Administered 2023-03-30: 80 mg via INTRAVENOUS
  Administered 2023-03-30: 20 mg via INTRAVENOUS

## 2023-03-30 MED ORDER — DEXMEDETOMIDINE HCL IN NACL 80 MCG/20ML IV SOLN
INTRAVENOUS | Status: DC | PRN
  Administered 2023-03-30: 12 ug via INTRAVENOUS

## 2023-03-30 MED ORDER — PROPOFOL 500 MG/50ML IV EMUL
INTRAVENOUS | Status: DC | PRN
  Administered 2023-03-30: 150 ug/kg/min via INTRAVENOUS

## 2023-03-30 MED ORDER — LIDOCAINE HCL (PF) 2 % IJ SOLN
INTRAMUSCULAR | Status: DC | PRN
  Administered 2023-03-30: 100 mg via INTRADERMAL

## 2023-03-30 MED ORDER — SODIUM CHLORIDE 0.9 % IV SOLN: INTRAVENOUS | Status: DC

## 2023-03-30 NOTE — Transfer of Care (Signed)
 Immediate Anesthesia Transfer of Care Note  Patient: Tina Holder  Procedure(s) Performed: COLONOSCOPY WITH PROPOFOL  ESOPHAGOGASTRODUODENOSCOPY (EGD) WITH PROPOFOL  BIOPSY POLYPECTOMY  Patient Location: Endoscopy Unit  Anesthesia Type:General  Level of Consciousness: drowsy  Airway & Oxygen Therapy: Patient Spontanous Breathing  Post-op Assessment: Report given to RN and Post -op Vital signs reviewed and stable  Post vital signs: Reviewed and stable  Last Vitals:  Vitals Value Taken Time  BP    Temp    Pulse    Resp    SpO2      Last Pain:  Vitals:   03/30/23 0819  TempSrc: Temporal         Complications: No notable events documented.

## 2023-03-30 NOTE — Anesthesia Postprocedure Evaluation (Signed)
 Anesthesia Post Note  Patient: Tina Holder  Procedure(s) Performed: COLONOSCOPY WITH PROPOFOL  ESOPHAGOGASTRODUODENOSCOPY (EGD) WITH PROPOFOL  BIOPSY POLYPECTOMY  Patient location during evaluation: Endoscopy Anesthesia Type: General Level of consciousness: awake and alert Pain management: pain level controlled Vital Signs Assessment: post-procedure vital signs reviewed and stable Respiratory status: spontaneous breathing, nonlabored ventilation and respiratory function stable Cardiovascular status: blood pressure returned to baseline and stable Postop Assessment: no apparent nausea or vomiting Anesthetic complications: no   No notable events documented.   Last Vitals:  Vitals:   03/30/23 0913 03/30/23 0922  BP: (!) 112/90 109/86  Pulse: 79   Resp: 17   Temp:    SpO2: 98%     Last Pain:  Vitals:   03/30/23 0903  TempSrc: Temporal  PainSc: Asleep                 Camellia Merilee Louder

## 2023-03-30 NOTE — Op Note (Signed)
 [facility]enterology Patient Name: Tina Holder Procedure Date: 03/30/2023 8:25 AM MRN: [id number] Account #: [id number] Date of Birth: 22-Jan-1986 Admit Type: Outpatient Age: 37 Room: [facility] ENDO ROOM 4 Gender: Female Note Status: Finalized Instrument Name: Barnie Endoscope [id number] Procedure:             Upper GI endoscopy Indications:           Dysphagia Providers:             Rogelia Copping MD, MD Referring MD:          Morene Sous PA, PA (Referring MD) Medicines:             Propofol  per Anesthesia Complications:         No immediate complications. Procedure:             Pre-Anesthesia Assessment:                        - Prior to the procedure, a History and Physical was                         performed, and patient medications and allergies were                         reviewed. The patient's tolerance of previous                         anesthesia was also reviewed. The risks and benefits                         of the procedure and the sedation options and risks                         were discussed with the patient. All questions were                         answered, and informed consent was obtained. Prior                         Anticoagulants: The patient has taken no anticoagulant                         or antiplatelet agents. ASA Grade Assessment: II - A                         patient with mild systemic disease. After reviewing                         the risks and benefits, the patient was deemed in                         satisfactory condition to undergo the procedure.                        After obtaining informed consent, the endoscope was                         passed under direct vision. Throughout the procedure,  the patient's blood pressure, pulse, and oxygen                         saturations were monitored continuously. The Endoscope                         was introduced through the mouth, and advanced  to the                         second part of duodenum. The upper GI endoscopy was                         accomplished without difficulty. The patient tolerated                         the procedure well. Findings:      The examined esophagus was normal. Two biopsies were obtained in the       middle third of the esophagus with cold forceps for histology.      The stomach was normal.      The examined duodenum was normal. Impression:            - Normal esophagus.                        - Normal stomach.                        - Normal examined duodenum.                        - Two biopsies were obtained in the middle third of                         the esophagus. Recommendation:        - Discharge patient to home.                        - Resume previous diet.                        - Continue present medications.                        - Await pathology results.                        - Perform a colonoscopy today. Procedure Code(s):     --- Professional ---                        [phone], Esophagogastroduodenoscopy, flexible,                         transoral; with biopsy, single or multiple Diagnosis Code(s):     --- Professional ---                        R13.10, Dysphagia, unspecified CPT copyright 2022 American Medical Association. All rights reserved. The codes documented in this report are preliminary and upon coder review may  be revised to meet current compliance requirements. Rogelia Copping MD, MD 03/30/2023 8:44:19 AM This  report has been signed electronically. Number of Addenda: 0 Note Initiated On: 03/30/2023 8:25 AM Estimated Blood Loss:  Estimated blood loss: none.      [facility]

## 2023-03-30 NOTE — Op Note (Signed)
 [facility] Gastroenterology Patient Name: Tina Holder Procedure Date: 03/30/2023 8:24 AM MRN: [id number] Account #: [id number] Date of Birth: 12-30-1985 Admit Type: Outpatient Age: 37 Room: [facility] ENDO ROOM 4 Gender: Female Note Status: Finalized Instrument Name: Veta [id number] Procedure:             Colonoscopy Indications:           Hematochezia Providers:             Rogelia Copping MD, MD Referring MD:          Morene Sous PA, PA (Referring MD) Medicines:             Propofol  per Anesthesia Complications:         No immediate complications. Procedure:             Pre-Anesthesia Assessment:                        - Prior to the procedure, a History and Physical was                         performed, and patient medications and allergies were                         reviewed. The patient's tolerance of previous                         anesthesia was also reviewed. The risks and benefits                         of the procedure and the sedation options and risks                         were discussed with the patient. All questions were                         answered, and informed consent was obtained. Prior                         Anticoagulants: The patient has taken no anticoagulant                         or antiplatelet agents. ASA Grade Assessment: II - A                         patient with mild systemic disease. After reviewing                         the risks and benefits, the patient was deemed in                         satisfactory condition to undergo the procedure.                        After obtaining informed consent, the colonoscope was                         passed under direct vision. Throughout the procedure,  the patient's blood pressure, pulse, and oxygen                         saturations were monitored continuously. The                         Colonoscope was introduced through the anus and                          advanced to the the cecum, identified by appendiceal                         orifice and ileocecal valve. The colonoscopy was                         performed without difficulty. The patient tolerated                         the procedure well. The quality of the bowel                         preparation was excellent. Findings:      The perianal and digital rectal examinations were normal.      A 3 mm polyp was found in the transverse colon. The polyp was sessile.       The polyp was removed with a cold snare. Resection and retrieval were       complete.      A 5 mm polyp was found in the sigmoid colon. The polyp was sessile. The       polyp was removed with a cold snare. Resection and retrieval were       complete.      Non-bleeding internal hemorrhoids were found during retroflexion. The       hemorrhoids were Grade II (internal hemorrhoids that prolapse but reduce       spontaneously). Impression:            - One 3 mm polyp in the transverse colon, removed with                         a cold snare. Resected and retrieved.                        - One 5 mm polyp in the sigmoid colon, removed with a                         cold snare. Resected and retrieved.                        - Non-bleeding internal hemorrhoids. Recommendation:        - Discharge patient to home.                        - Resume previous diet.                        - Continue present medications.                        - Await pathology results.                        -  If the pathology report reveals adenomatous tissue,                         then repeat the colonoscopy for surveillance in 7                         years. Procedure Code(s):     --- Professional ---                        [phone], Colonoscopy, flexible; with removal of                         tumor(s), polyp(s), or other lesion(s) by snare                         technique Diagnosis Code(s):     --- Professional ---                         K92.1, Melena (includes Hematochezia)                        D12.5, Benign neoplasm of sigmoid colon                        D12.3, Benign neoplasm of transverse colon (hepatic                         flexure or splenic flexure) CPT copyright 2022 American Medical Association. All rights reserved. The codes documented in this report are preliminary and upon coder review may  be revised to meet current compliance requirements. Rogelia Copping MD, MD 03/30/2023 8:59:09 AM This report has been signed electronically. Number of Addenda: 0 Note Initiated On: 03/30/2023 8:24 AM Scope Withdrawal Time: 0 hours 8 minutes 37 seconds  Total Procedure Duration: 0 hours 10 minutes 53 seconds  Estimated Blood Loss:  Estimated blood loss: none.      [facility]

## 2023-03-30 NOTE — Anesthesia Preprocedure Evaluation (Addendum)
 Anesthesia Evaluation  Patient identified by MRN, date of birth, ID band Patient awake    Reviewed: Allergy & Precautions, H&P , NPO status , Patient's Chart, lab work & pertinent test results  Airway Mallampati: III  TM Distance: >3 FB Neck ROM: full    Dental  (+) Missing   Pulmonary asthma    Pulmonary exam normal        Cardiovascular negative cardio ROS Normal cardiovascular exam  Collagen vascular disease   Neuro/Psych  Headaches  negative psych ROS   GI/Hepatic Neg liver ROS,GERD  Controlled,,  Endo/Other  negative endocrine ROS    Renal/GU negative Renal ROS  negative genitourinary   Musculoskeletal   Abdominal  (+) + obese  Peds  Hematology negative hematology ROS (+)   Anesthesia Other Findings Bleeding of Unknown Cause 05/2021: Per Hematology: BEFORE PROCEDURE:  - You should take Tranexamic acid 650 mg tablet, 2 tabs the night before procedure.  -prescription for tranexamic acid has been sent to your local pharmacy. Please pick it up prior to your procedure     Past Medical History: No date: Arthritis     Comment:  lower back, hands No date: Asthma No date: Bleeding disorder (HCC)     Comment:  Sees [facility].  Takes 5 days Lysteda prior to               surgeries. No date: Collagen vascular disease (HCC) No date: GERD (gastroesophageal reflux disease) No date: Headache(784.0)     Comment:  Migraines 2x/mo. No date: Obesity 2009, 2017: Pregnancy induced hypertension     Comment:  resolved after pregnancy No date: SVD (spontaneous vaginal delivery)     Comment:  x 4 No date: Wears contact lenses  Past Surgical History: No date: ABDOMINAL HYSTERECTOMY 11/26/2020: BALLOON DILATION; N/A     Comment:  Procedure: BALLOON DILATION;  Surgeon: Cindie Carlin POUR, DO;  Location: [facility] END[facility] SU[facility];  Service: Endoscopy;                Laterality: N/A; 11/26/2020: BIOPSY     Comment:   Procedure: BIOPSY;  Surgeon: Cindie Carlin POUR, DO;                Location: [facility] ENDO SUITE;  Service: Endoscopy;; 11/26/2020: ESOPHAGOGASTRODUODENOSCOPY (EGD) WITH PROPOFOL ; N/A     Comment:  Procedure: ESOPHAGOGASTRODUODENOSCOPY (EGD) WITH               PROPOFOL ;  Surgeon: Cindie Carlin POUR, DO;  Location: [facility]               ENDO SUITE;  Service: Endoscopy;  Laterality: N/A;                8:30am 04/15/2018: LAPAROSCOPY; N/A     Comment:  Procedure: DIAGNOSTIC LAPAROSCOPY WITH EVACUATION OF               HEMATOMA;  Surgeon: Fredirick Glenys RAMAN, MD;  Location: [facility]               ORS;  Service: Gynecology;  Laterality: N/A; 04/15/2018: REPAIR VAGINAL CUFF; N/A     Comment:  Procedure: REPAIR VAGINAL CUFF;  Surgeon: Fredirick Glenys RAMAN, MD;  Location: [facility] ORS;  Service: Gynecology;                Laterality:  N/A; 2005: TONSILLECTOMY 01/01/2012: TUBAL LIGATION     Comment:  Procedure: POST PARTUM TUBAL LIGATION;  Surgeon: Elveria Mungo, MD;  Location: [facility] ORS;  Service:               Gynecology;  Laterality: Bilateral;  with filshie clips 04/12/2018: VAGINAL HYSTERECTOMY; Right     Comment:  Procedure: HYSTERECTOMY VAGINAL WITH RIGHT               SALPINGECTOMY;  Surgeon: Fredirick Glenys RAMAN, MD;  Location:               [facility] ORS;  Service: Gynecology;  Laterality: Right; No date: WISDOM TOOTH EXTRACTION     Reproductive/Obstetrics negative OB ROS                             Anesthesia Physical Anesthesia Plan  ASA: 2  Anesthesia Plan: General   Post-op Pain Management:    Induction: Intravenous  PONV Risk Score and Plan: Propofol  infusion and TIVA  Airway Management Planned: Natural Airway  Additional Equipment:   Intra-op Plan:   Post-operative Plan:   Informed Consent: I have reviewed the patients History and Physical, chart, labs and discussed the procedure including the risks, benefits and alternatives for the proposed  anesthesia with the patient or authorized representative who has indicated his/her understanding and acceptance.     Dental Advisory Given  Plan Discussed with: CRNA and Surgeon  Anesthesia Plan Comments:         Anesthesia Quick Evaluation

## 2023-03-30 NOTE — H&P (Signed)
 Tina Copping, MD [facility] [address], [address] [place], [place] [address] Phone:[phone] Fax : [phone]  Primary Care Physician:  Kristie Morene LITTIE DEVONNA Primary Gastroenterologist:  Dr. Copping  Pre-Procedure History & Physical: HPI:  Tina Holder is Holder 37 y.o. female is here for an endoscopy and colonoscopy.   Past Medical History:  Diagnosis Date   Arthritis    lower back, hands   Asthma    Bleeding disorder (HCC)    Sees [facility].  Takes 5 days Lysteda prior to surgeries.   Collagen vascular disease (HCC)    GERD (gastroesophageal reflux disease)    Headache(784.0)    Migraines 2x/mo.   Obesity    Pregnancy induced hypertension 2009, 2017   resolved after pregnancy   SVD (spontaneous vaginal delivery)    x 4   Wears contact lenses     Past Surgical History:  Procedure Laterality Date   ABDOMINAL HYSTERECTOMY     BALLOON DILATION N/Holder 11/26/2020   Procedure: BALLOON DILATION;  Surgeon: Cindie Carlin POUR, DO;  [facility];  Service: Endoscopy;  Laterality: N/Holder;   BIOPSY  11/26/2020   Procedure: BIOPSY;  Surgeon: Cindie Carlin POUR, DO;  [facility];  Service: Endoscopy;;   ESOPHAGOGASTRODUODENOSCOPY (EGD) WITH PROPOFOL  N/Holder 11/26/2020   Procedure: ESOPHAGOGASTRODUODENOSCOPY (EGD) WITH PROPOFOL ;  Surgeon: Cindie Carlin POUR, DO;  Location: [facility];  Service: Endoscopy;  Laterality: N/Holder;  8:30am   LAPAROSCOPY N/Holder 04/15/2018   Procedure: DIAGNOSTIC LAPAROSCOPY WITH EVACUATION OF HEMATOMA;  Surgeon: Fredirick Glenys RAMAN, MD;  Location: [facility] ORS;  Service: Gynecology;  Laterality: N/Holder;   REPAIR VAGINAL CUFF N/Holder 04/15/2018   Procedure: REPAIR VAGINAL CUFF;  Surgeon: Fredirick Glenys RAMAN, MD;  Location: [facility];  Service: Gynecology;  Laterality: N/Holder;   TONSILLECTOMY  2005   TUBAL LIGATION  01/01/2012   Procedure: POST PARTUM TUBAL LIGATION;  Surgeon: Elveria Mungo, MD;  Location: [facility] ORS;  Service: Gynecology;  Laterality: Bilateral;  with  filshie clips   VAGINAL HYSTERECTOMY Right 04/12/2018   Procedure: HYSTERECTOMY VAGINAL WITH RIGHT SALPINGECTOMY;  Surgeon: Fredirick Glenys RAMAN, MD;  Location: [facility] ORS;  Service: Gynecology;  Laterality: Right;   WISDOM TOOTH EXTRACTION      Prior to Admission medications   Medication Sig Start Date End Date Taking? Authorizing Provider  atomoxetine (STRATTERA) 60 MG capsule Take 60 mg by mouth every morning. 11/04/22  Yes [provider]  colestipol  (COLESTID ) 1 g tablet TAKE 1 TABLET BY MOUTH 2 TIMES Holder DAY 03/03/23  Yes Holder Rogelia, MD  cyclobenzaprine  (FLEXERIL ) 10 MG tablet Take 1 tablet (10 mg total) by mouth at bedtime as needed for muscle spasms. 07/24/22  Yes Teague Gretta Darice BRAVO, PA-C  eletriptan  (RELPAX ) 40 MG tablet Take 1 tablet (40 mg total) by mouth as needed for migraine or headache. May repeat in 2 hours if headache persists or recurs. 07/24/22  Yes Teague Gretta Darice BRAVO, PA-C  leflunomide (ARAVA) 20 MG tablet Take 20 mg by mouth daily. 09/05/21  Yes [provider]  pantoprazole  (PROTONIX ) 40 MG tablet TAKE 1 TABLET BY MOUTH DAILY 01/11/23  Yes Holder Rogelia, MD  tranexamic acid (LYSTEDA) 650 MG TABS tablet Tranexamic acid 2 tablets po every 8 hours x 5 days prn bleeding and as directed for procedures 11/02/21  Yes [provider]  Butalbital -APAP-Caffeine  50-325-40 MG capsule TAKE ONE TO TWO CAPSULES BY MOUTH EVERY 6 HOURS AS NEEDED FOR HEADACHE 07/16/22   Tina, Ugonna A,  MD  cloNIDine (CATAPRES) 0.1 MG tablet Take 0.1 mg by mouth 2 (two) times daily. 08/25/22   [provider]  doxycycline  (VIBRAMYCIN ) 100 MG capsule Take 1 capsule (100 mg total) by mouth 2 (two) times daily. Patient not taking: Reported on 03/30/2023 01/10/23   Tina Shelba SAUNDERS, NP  EMGALITY  120 MG/ML SOAJ Inject 1 mL into the skin every 30 (thirty) days. 11/16/22   [provider]  fluconazole  (DIFLUCAN ) 150 MG tablet Take 1 tablet (150 mg total) by mouth daily. Take as  directed. 01/01/23   Corlis Burnard DEL, NP  magic mouthwash (lidocaine , diphenhydrAMINE , alum & mag hydroxide) suspension Swish and spit 5 mLs 4 (four) times daily as needed for mouth pain. 11/25/22   Bernardino Ditch, NP  metoCLOPramide  (REGLAN ) 10 MG tablet Take 1 tablet (10 mg total) by mouth every 6 (six) hours as needed for nausea. 03/01/20   Nance Gaskins, Darice BRAVO, PA-C  metoprolol  succinate (TOPROL  XL) 50 MG 24 hr tablet Take 1 tablet (50 mg total) by mouth daily. Take with or immediately following Holder meal. 07/24/22   Nance Gaskins, Darice BRAVO, PA-C  promethazine  (PHENERGAN ) 25 MG tablet TAKE ONE TABLET BY MOUTH EVERY 6 HOURS AS NEEDED FOR NAUSEA AND HEADACHE 07/24/22   Nance Gaskins, Darice BRAVO, PA-C  Rimegepant Sulfate (NURTEC) 75 MG TBDP Take 1 tablet (75 mg total) by mouth every other day. 07/24/22   Nance Gaskins, Darice BRAVO, PA-C  traMADol  (ULTRAM ) 50 MG tablet Take 50 mg by mouth every 8 (eight) hours as needed. 09/24/21   [provider]  VENTOLIN  HFA 108 (90 Base) MCG/ACT inhaler Inhale into the lungs. 11/26/21   [provider]    Allergies as of 02/11/2023 - Review Complete 01/10/2023  Allergen Reaction Noted   Aspirin  06/18/2021   Nsaids  06/18/2021   Onion  08/17/2022   Silicone Other (See Comments) 09/11/2021   Oxycodone  Nausea And Vomiting 09/23/2021   Wound dressing adhesive Itching and Rash 10/25/2019    Family History  Problem Relation Age of Onset   Arthritis Mother    Thyroid  disease Maternal Grandmother    Lupus Maternal Grandmother     Social History   Socioeconomic History   Marital status: Married    Spouse name: Not on file   Number of children: 4   Years of education: Not on file   Highest education level: Not on file  Occupational History   Not on file  Tobacco Use   Smoking status: Never   Smokeless tobacco: Never  Vaping Use   Vaping status: Never Used  Substance and Sexual Activity   Alcohol use: Yes    Comment: Rare   Drug use: No   Sexual  activity: Not Currently    Birth control/protection: None, Surgical    Comment: tubal  Other Topics Concern   Not on file  Social History Narrative   Not on file   Social Drivers of Health   Financial Resource Strain: Not on file  Food Insecurity: Not on file  Transportation Needs: Not on file  Physical Activity: Not on file  Stress: Not on file  Social Connections: Not on file  Intimate Partner Violence: Not on file    Review of Systems: See HPI, otherwise negative ROS  Physical Exam: BP (!) 121/97   Pulse 86   Temp (!) 96.9 F (36.1 C) (Temporal)   Resp 16   Ht 5' 2 (1.575 m)   Wt 88 kg  LMP 03/14/2018 (Exact Date)   SpO2 98%   BMI 35.48 kg/m  General:   Alert,  pleasant and cooperative in NAD Head:  Normocephalic and atraumatic. Neck:  Supple; no masses or thyromegaly. Lungs:  Clear throughout to auscultation.    Heart:  Regular rate and rhythm. Abdomen:  Soft, nontender and nondistended. Normal bowel sounds, without guarding, and without rebound.   Neurologic:  Alert and  oriented x4;  grossly normal neurologically.  Impression/Plan: Katerra Ingman is here for an endoscopy and colonoscopy to be performed for rectal bleeding and dysphagia  Risks, benefits, limitations, and alternatives regarding  endoscopy and colonoscopy have been reviewed with the patient.  Questions have been answered.  All parties agreeable.   Tina Copping, MD  03/30/2023, 8:23 AM

## 2023-04-01 ENCOUNTER — Encounter: Payer: Self-pay | Admitting: Gastroenterology

## 2023-04-06 ENCOUNTER — Ambulatory Visit
Admission: EM | Admit: 2023-04-06 | Discharge: 2023-04-06 | Disposition: A | Discharge status: Home / Self Care | Payer: Medicaid Other | Attending: Emergency Medicine | Admitting: Emergency Medicine

## 2023-04-06 DIAGNOSIS — J029 Acute pharyngitis, unspecified: Secondary | ICD-10-CM

## 2023-04-06 DIAGNOSIS — J069 Acute upper respiratory infection, unspecified: Secondary | ICD-10-CM

## 2023-04-06 DIAGNOSIS — H9201 Otalgia, right ear: Secondary | ICD-10-CM

## 2023-04-06 LAB — SURGICAL PATHOLOGY

## 2023-04-06 LAB — POCT RAPID STREP A (OFFICE): Rapid Strep A Screen: NEGATIVE

## 2023-04-06 NOTE — ED Provider Notes (Signed)
 Tina Holder    CSN: [id number] Arrival date & time: 04/06/23  1311      History   Chief Complaint Chief Complaint  Patient presents with   Otalgia    HPI Tina Holder is a 38 y.o. female.  Patient presents with right ear pain and sore throat x 3 days.  No fever, cough, wheezing, shortness of breath.  No OTC medications taken today.  The history is provided by the patient and medical records.    Past Medical History:  Diagnosis Date   Arthritis    lower back, hands   Asthma    Bleeding disorder (HCC)    Sees [facility].  Takes 5 days Lysteda prior to surgeries.   Collagen vascular disease (HCC)    GERD (gastroesophageal reflux disease)    Headache(784.0)    Migraines 2x/mo.   Obesity    Pregnancy induced hypertension 2009, 2017   resolved after pregnancy   SVD (spontaneous vaginal delivery)    x 4   Wears contact lenses     Patient Active Problem List   Diagnosis Date Noted   Esophageal dysphagia 03/30/2023   Rectal bleeding 03/30/2023   Polyp of sigmoid colon 03/30/2023   Bleeding disorder (HCC)    Chronic migraine without aura 12/12/2021   Migraine without aura and with status migrainosus, not intractable 09/08/2019   Migraine without aura and without status migrainosus, not intractable 05/12/2019   Muscle spasm 09/06/2015   GERD (gastroesophageal reflux disease) 11/25/2011   Asthma 09/22/2011   Migraine headache 06/10/2011   Allergic rhinitis 06/01/2010    Past Surgical History:  Procedure Laterality Date   ABDOMINAL HYSTERECTOMY     BALLOON DILATION N/A 11/26/2020   Procedure: BALLOON DILATION;  Surgeon: Cindie Carlin POUR, DO;  Location: [facility];  Service: Endoscopy;  Laterality: N/A;   BIOPSY  11/26/2020   Procedure: BIOPSY;  Surgeon: Cindie Carlin POUR, DO;  Location: [facility];  Service: Endoscopy;;   BIOPSY  03/30/2023   Procedure: BIOPSY;  Surgeon: Jinny Carmine, MD;  Location: [facility];  Service: Endoscopy;;    COLONOSCOPY WITH PROPOFOL  N/A 03/30/2023   Procedure: COLONOSCOPY WITH PROPOFOL ;  Surgeon: Jinny Carmine, MD;  Location: [facility];  Service: Endoscopy;  Laterality: N/A;   ESOPHAGOGASTRODUODENOSCOPY (EGD) WITH PROPOFOL  N/A 11/26/2020   Procedure: ESOPHAGOGASTRODUODENOSCOPY (EGD) WITH PROPOFOL ;  Surgeon: Cindie Carlin POUR, DO;  Location: [facility];  Service: Endoscopy;  Laterality: N/A;  8:30am   ESOPHAGOGASTRODUODENOSCOPY (EGD) WITH PROPOFOL  N/A 03/30/2023   Procedure: ESOPHAGOGASTRODUODENOSCOPY (EGD) WITH PROPOFOL ;  Surgeon: Jinny Carmine, MD;  Location: [facility];  Service: Endoscopy;  Laterality: N/A;   LAPAROSCOPY N/A 04/15/2018   Procedure: DIAGNOSTIC LAPAROSCOPY WITH EVACUATION OF HEMATOMA;  Surgeon: Fredirick Glenys RAMAN, MD;  Location: [facility];  Service: Gynecology;  Laterality: N/A;   POLYPECTOMY  03/30/2023   Procedure: POLYPECTOMY;  Surgeon: Jinny Carmine, MD;  Location: [facility];  Service: Endoscopy;;   REPAIR VAGINAL CUFF N/A 04/15/2018   Procedure: REPAIR VAGINAL CUFF;  Surgeon: Fredirick Glenys RAMAN, MD;  Location: [facility];  Service: Gynecology;  Laterality: N/A;   TONSILLECTOMY  2005   TUBAL LIGATION  01/01/2012   Procedure: POST PARTUM TUBAL LIGATION;  Surgeon: Elveria Mungo, MD;  Location: [facility];  Service: Gynecology;  Laterality: Bilateral;  with filshie clips   VAGINAL HYSTERECTOMY Right 04/12/2018   Procedure: HYSTERECTOMY VAGINAL WITH RIGHT SALPINGECTOMY;  Surgeon: Fredirick Glenys RAMAN, MD;  Location: [facility];  Service: Gynecology;  Laterality: Right;  WISDOM TOOTH EXTRACTION      OB History     Gravida  4   Para  4   Term  4   Preterm  0   AB  0   Living  4      SAB  0   IAB  0   Ectopic  0   Multiple  0   Live Births  4            Home Medications    Prior to Admission medications   Medication Sig Start Date End Date Taking? Authorizing Provider  atomoxetine (STRATTERA) 60 MG capsule Take 60 mg by mouth every morning. 11/04/22    [provider]  Butalbital -APAP-Caffeine  50-325-40 MG capsule TAKE ONE TO TWO CAPSULES BY MOUTH EVERY 6 HOURS AS NEEDED FOR HEADACHE 07/16/22   Anyanwu, Ugonna A, MD  cloNIDine (CATAPRES) 0.1 MG tablet Take 0.1 mg by mouth 2 (two) times daily. 08/25/22   [provider]  colestipol  (COLESTID ) 1 g tablet TAKE 1 TABLET BY MOUTH 2 TIMES A DAY 03/03/23   Jinny Carmine, MD  cyclobenzaprine  (FLEXERIL ) 10 MG tablet Take 1 tablet (10 mg total) by mouth at bedtime as needed for muscle spasms. 07/24/22   Nance Gaskins, Darice BRAVO, PA-C  doxycycline  (VIBRAMYCIN ) 100 MG capsule Take 1 capsule (100 mg total) by mouth 2 (two) times daily. Patient not taking: Reported on 03/30/2023 01/10/23   Teresa Shelba SAUNDERS, NP  eletriptan  (RELPAX ) 40 MG tablet Take 1 tablet (40 mg total) by mouth as needed for migraine or headache. May repeat in 2 hours if headache persists or recurs. 07/24/22   Teague Gaskins, Darice BRAVO, PA-C  EMGALITY  120 MG/ML SOAJ Inject 1 mL into the skin every 30 (thirty) days. 11/16/22   [provider]  fluconazole  (DIFLUCAN ) 150 MG tablet Take 1 tablet (150 mg total) by mouth daily. Take as directed. Patient not taking: Reported on 04/06/2023 01/01/23   Corlis Burnard DEL, NP  leflunomide (ARAVA) 20 MG tablet Take 20 mg by mouth daily. 09/05/21   [provider]  magic mouthwash (lidocaine , diphenhydrAMINE , alum & mag hydroxide) suspension Swish and spit 5 mLs 4 (four) times daily as needed for mouth pain. 11/25/22   Bernardino Ditch, NP  metoCLOPramide  (REGLAN ) 10 MG tablet Take 1 tablet (10 mg total) by mouth every 6 (six) hours as needed for nausea. 03/01/20   Nance Gaskins, Darice BRAVO, PA-C  metoprolol  succinate (TOPROL  XL) 50 MG 24 hr tablet Take 1 tablet (50 mg total) by mouth daily. Take with or immediately following a meal. 07/24/22   Nance Gaskins, Darice BRAVO, PA-C  pantoprazole  (PROTONIX ) 40 MG tablet TAKE 1 TABLET BY MOUTH DAILY 01/11/23   Jinny Carmine, MD  promethazine  (PHENERGAN ) 25 MG tablet  TAKE ONE TABLET BY MOUTH EVERY 6 HOURS AS NEEDED FOR NAUSEA AND HEADACHE 07/24/22   Nance Gaskins, Darice BRAVO, PA-C  Rimegepant Sulfate (NURTEC) 75 MG TBDP Take 1 tablet (75 mg total) by mouth every other day. 07/24/22   Nance Gaskins, Darice BRAVO, PA-C  traMADol  (ULTRAM ) 50 MG tablet Take 50 mg by mouth every 8 (eight) hours as needed. 09/24/21   [provider]  tranexamic acid (LYSTEDA) 650 MG TABS tablet Tranexamic acid 2 tablets po every 8 hours x 5 days prn bleeding and as directed for procedures 11/02/21   [provider]  VENTOLIN  HFA 108 (90 Base) MCG/ACT inhaler Inhale into the lungs. 11/26/21   [provider]  Family History Family History  Problem Relation Age of Onset   Arthritis Mother    Thyroid  disease Maternal Grandmother    Lupus Maternal Grandmother     Social History Social History   Tobacco Use   Smoking status: Never   Smokeless tobacco: Never  Vaping Use   Vaping status: Never Used  Substance Use Topics   Alcohol use: Yes    Comment: Rare   Drug use: No     Allergies   Aspirin, Nsaids, Onion, Silicone, Oxycodone , and Wound dressing adhesive   Review of Systems Review of Systems  Constitutional:  Negative for chills and fever.  HENT:  Positive for ear pain and sore throat.   Respiratory:  Negative for cough and shortness of breath.      Physical Exam Triage Vital Signs ED Triage Vitals  Encounter Vitals Group     BP 04/06/23 1349 123/89     Systolic BP Percentile --      Diastolic BP Percentile --      Pulse Rate 04/06/23 1349 85     Resp 04/06/23 1349 18     Temp 04/06/23 1349 98.6 F (37 C)     Temp src --      SpO2 04/06/23 1349 97 %     Weight --      Height --      Head Circumference --      Peak Flow --      Pain Score 04/06/23 1348 5     Pain Loc --      Pain Education --      Exclude from Growth Chart --    No data found.  Updated Vital Signs BP 123/89   Pulse 85   Temp 98.6 F (37 C)   Resp 18   LMP  03/14/2018 (Exact Date)   SpO2 97%   Visual Acuity Right Eye Distance:   Left Eye Distance:   Bilateral Distance:    Right Eye Near:   Left Eye Near:    Bilateral Near:     Physical Exam Constitutional:      General: She is not in acute distress. HENT:     Right Ear: Tympanic membrane and ear canal normal.     Left Ear: Tympanic membrane and ear canal normal.     Nose: Nose normal.     Mouth/Throat:     Mouth: Mucous membranes are moist.     Pharynx: Oropharynx is clear.  Cardiovascular:     Rate and Rhythm: Normal rate and regular rhythm.     Heart sounds: Normal heart sounds.  Pulmonary:     Effort: Pulmonary effort is normal. No respiratory distress.     Breath sounds: Normal breath sounds.  Neurological:     Mental Status: She is alert.      UC Treatments / Results  Labs (all labs ordered are listed, but only abnormal results are displayed) Labs Reviewed  POCT RAPID STREP A (OFFICE)    EKG   Radiology No results found.  Procedures Procedures (including critical care time)  Medications Ordered in UC Medications - No data to display  Initial Impression / Assessment and Plan / UC Course  I have reviewed the triage vital signs and the nursing notes.  Pertinent labs & imaging results that were available during my care of the patient were reviewed by me and considered in my medical decision making (see chart for details).    Right otalgia, sore throat, viral  URI.  Rapid strep negative.  Discussed symptomatic treatment including Tylenol  or ibuprofen  as needed.  Instructed patient to follow-up with her PCP if she is not improving.  She agrees to plan of care.  Final Clinical Impressions(s) / UC Diagnoses   Final diagnoses:  Otalgia of right ear  Sore throat  Viral URI     Discharge Instructions      Follow-up with your primary care provider if your symptoms are not improving.      ED Prescriptions   None    PDMP not reviewed this  encounter.   Corlis Burnard DEL, NP 04/06/23 1420

## 2023-04-06 NOTE — Discharge Instructions (Addendum)
 Follow up with your primary care provider if your symptoms are not improving.

## 2023-04-06 NOTE — ED Triage Notes (Signed)
 Patient to Urgent Care with complaints of right sided ear pain/ sore throat. Denies any known fevers.  Reports her symptoms started 3-4 days ago.   No otc meds.

## 2023-04-08 ENCOUNTER — Encounter: Payer: Self-pay | Admitting: Gastroenterology

## 2023-05-19 ENCOUNTER — Other Ambulatory Visit: Payer: Self-pay | Admitting: Gastroenterology

## 2023-05-21 ENCOUNTER — Other Ambulatory Visit: Payer: Self-pay | Admitting: *Deleted

## 2023-05-21 MED ORDER — CYCLOBENZAPRINE HCL 10 MG PO TABS: 10.0000 mg | ORAL_TABLET | Freq: Every evening | ORAL | 3 refills | Status: DC | PRN

## 2023-07-19 ENCOUNTER — Other Ambulatory Visit: Payer: Self-pay | Admitting: Obstetrics & Gynecology

## 2023-07-19 DIAGNOSIS — G43009 Migraine without aura, not intractable, without status migrainosus: Secondary | ICD-10-CM

## 2023-07-20 ENCOUNTER — Other Ambulatory Visit: Payer: Self-pay | Admitting: Physician Assistant

## 2023-07-20 DIAGNOSIS — G43009 Migraine without aura, not intractable, without status migrainosus: Secondary | ICD-10-CM

## 2023-07-20 MED ORDER — BUTALBITAL-APAP-CAFFEINE 50-325-40 MG PO CAPS: 1.0000 | ORAL_CAPSULE | ORAL | 1 refills | Status: AC | PRN

## 2023-08-18 ENCOUNTER — Other Ambulatory Visit: Payer: Self-pay | Admitting: *Deleted

## 2023-08-18 ENCOUNTER — Encounter: Payer: Self-pay | Admitting: *Deleted

## 2023-08-18 MED ORDER — EMGALITY 120 MG/ML ~~LOC~~ SOAJ: 1.0000 mL | SUBCUTANEOUS | 6 refills | Status: DC

## 2023-08-18 MED ORDER — ELETRIPTAN HYDROBROMIDE 40 MG PO TABS: 40.0000 mg | ORAL_TABLET | ORAL | 2 refills | Status: DC | PRN

## 2023-09-08 ENCOUNTER — Other Ambulatory Visit: Payer: Self-pay | Admitting: Obstetrics & Gynecology

## 2023-09-08 DIAGNOSIS — G43009 Migraine without aura, not intractable, without status migrainosus: Secondary | ICD-10-CM

## 2023-09-09 ENCOUNTER — Encounter: Payer: Self-pay | Admitting: *Deleted

## 2023-09-09 ENCOUNTER — Other Ambulatory Visit: Payer: Self-pay | Admitting: *Deleted

## 2023-09-09 MED ORDER — PROMETHAZINE HCL 25 MG PO TABS
ORAL_TABLET | ORAL | 1 refills | Status: AC
Start: 2023-09-09 — End: ?

## 2023-09-09 MED ORDER — NURTEC 75 MG PO TBDP: 75.0000 mg | ORAL_TABLET | ORAL | 1 refills | Status: DC

## 2023-10-08 ENCOUNTER — Encounter: Admitting: Physician Assistant

## 2024-05-05 ENCOUNTER — Encounter: Payer: Self-pay | Admitting: Physician Assistant

## 2024-05-05 ENCOUNTER — Telehealth (HOSPITAL_COMMUNITY): Payer: Self-pay | Admitting: Pharmacy Technician

## 2024-05-05 ENCOUNTER — Other Ambulatory Visit: Payer: Self-pay

## 2024-05-05 ENCOUNTER — Ambulatory Visit: Admitting: Physician Assistant

## 2024-05-05 ENCOUNTER — Other Ambulatory Visit (HOSPITAL_COMMUNITY): Payer: Self-pay

## 2024-05-05 ENCOUNTER — Other Ambulatory Visit: Payer: Self-pay | Admitting: Pharmacy Technician

## 2024-05-05 VITALS — BP 117/81 | HR 91

## 2024-05-05 DIAGNOSIS — G43709 Chronic migraine without aura, not intractable, without status migrainosus: Secondary | ICD-10-CM

## 2024-05-05 DIAGNOSIS — M62838 Other muscle spasm: Secondary | ICD-10-CM

## 2024-05-05 MED ORDER — BACLOFEN 10 MG PO TABS: 10.0000 mg | ORAL_TABLET | Freq: Three times a day (TID) | ORAL | 2 refills | Status: AC | PRN

## 2024-05-05 MED ORDER — NURTEC 75 MG PO TBDP: 75.0000 mg | ORAL_TABLET | ORAL | 11 refills | Status: AC

## 2024-05-05 MED ORDER — BOTOX 200 UNITS IJ SOLR
200.0000 [IU] | INTRAMUSCULAR | 3 refills | Status: AC
  Filled 2024-05-05: qty 1, 90d supply, fill #0

## 2024-05-05 MED ORDER — METOPROLOL TARTRATE 25 MG PO TABS: 25.0000 mg | ORAL_TABLET | Freq: Two times a day (BID) | ORAL | 1 refills | Status: AC

## 2024-05-05 MED ORDER — METOCLOPRAMIDE HCL 10 MG PO TABS: 10.0000 mg | ORAL_TABLET | Freq: Four times a day (QID) | ORAL | 1 refills | Status: AC | PRN

## 2024-05-05 MED ORDER — EMGALITY 120 MG/ML ~~LOC~~ SOAJ: SUBCUTANEOUS | 11 refills | Status: AC

## 2024-05-05 MED ORDER — ELETRIPTAN HYDROBROMIDE 40 MG PO TABS: 40.0000 mg | ORAL_TABLET | ORAL | 2 refills | Status: AC | PRN

## 2024-05-05 NOTE — Telephone Encounter (Signed)
"  °  °  °  Ran test claim- Botox  PA required     Pharmacy Patient Advocate Encounter   Received notification from Pharmacy that prior authorization for Botox  200u is required/requested.   Insurance verification completed.   The patient is insured through [facility].   Key- [id number]   Awaiting clinical questions to generate.       "

## 2024-05-05 NOTE — Progress Notes (Signed)
 Requesting refill of headache medications   History:  Tina Holder is a 39 y.o. [phone] who presents to clinic today to re-establish care for migraine.  She was without medical insurance for some time and she was unable to access medications or regular appointments.  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  She notes her headaches worsened signficantly in terms of frequency, severity and difficulty to treat without her medications.  The quality of headache is unchanged.  She would like to restart and to try again to obtain botox  as she had a different insurance plan the last time she tried.   She has had 2 episodes of acute onset torticollis for which she has seen pcp and noted relief with prednisone.    Past Medical History:  Diagnosis Date   Arthritis    lower back, hands   Asthma    Bleeding disorder    Sees [facility].  Takes 5 days Lysteda prior to surgeries.   Collagen vascular disease    GERD (gastroesophageal reflux disease)    Headache(784.0)    Migraines 2x/mo.   Obesity    Pregnancy induced hypertension 2009, 2017   resolved after pregnancy   SVD (spontaneous vaginal delivery)    x 4   Wears contact lenses     Social History   Socioeconomic History   Marital status: Married    Spouse name: Not on file   Number of children: 4   Years of education: Not on file   Highest education level: Not on file  Occupational History   Not on file  Tobacco Use   Smoking status: Never   Smokeless tobacco: Never  Vaping Use   Vaping status: Never Used  Substance and Sexual Activity   Alcohol use: Yes    Comment: Rare   Drug use: No   Sexual activity: Not Currently    Birth control/protection: None, Surgical    Comment: tubal  Other Topics Concern   Not on file  Social History Narrative   Not on file   Social Drivers of Health   Tobacco Use: Low Risk (05/05/2024)   Patient History    Smoking Tobacco Use: Never    Smokeless Tobacco Use: Never    Passive  Exposure: Not on file  Financial Resource Strain: Not on file  Food Insecurity: Not on file  Transportation Needs: Not on file  Physical Activity: Not on file  Stress: Not on file  Social Connections: Not on file  Intimate Partner Violence: Not on file  Depression (EYV7-0): Not on file  Alcohol Screen: Not on file  Housing: Not on file  Utilities: Not on file  Health Literacy: Not on file    Family History  Problem Relation Age of Onset   Arthritis Mother    Thyroid  disease Maternal Grandmother    Lupus Maternal Grandmother     Allergies[1]  Medications Ordered Prior to Encounter[2]   Review of Systems:  All pertinent positive/negative included in HPI, all other review of systems are negative   Objective:  Physical Exam BP 117/81   Pulse 91   LMP 03/14/2018  CONSTITUTIONAL: Well-developed, well-nourished female in no acute distress.  EYES: EOM intact ENT: Normocephalic CARDIOVASCULAR: Regular rate  RESPIRATORY: Normal rate.  MUSCULOSKELETAL: Normal ROM SKIN: Warm, dry without erythema  NEUROLOGICAL: Alert, oriented, CN II-XII grossly intact, Appropriate balance  PSYCH: Normal behavior, mood   Assessment & Plan:  Assessment: 1. Chronic migraine without aura without status migrainosus, not intractable  2. Muscle spasm      Plan: Restart Emgality  - 2 injections for first dose followed by 1 every 30 days.   Will seek approval for Botox  for migraine prevention as pt was poorly controlled previously on her multiple medications Metoprolol  - hold for 2 months to see how emgality  is going to work.  Begin with 1 daily and increase to 2 daily as needed/tolerated.  Samples given for nurtec and Symbravo.  She is aware nurtec is less likely to help with recent emgality .  Symbravo/nurtec/relpax  for acute migraine.  Rtc hopefully in 3 months for botox  or f/u   55 minutes this encounter in direct patient care/documentation/plan.    Nance Gretta Darice FORBES,  PA-C 05/05/2024 8:28 AM      [1]  Allergies Allergen Reactions   Aspirin     Other reaction(s): Other (See Comments) ASA contraindicated with bleeding disorder    Nsaids     Other reaction(s): Other (See Comments) NSAIDS contraindicated with bleeding disorder.    Onion     Migraines   Silicone Other (See Comments)    Pt unaware of issue   Oxycodone  Nausea And Vomiting   Wound Dressing Adhesive Itching and Rash    Other reaction(s):   [2]  Current Outpatient Medications on File Prior to Visit  Medication Sig Dispense Refill   atomoxetine (STRATTERA) 60 MG capsule Take 60 mg by mouth every morning.     Butalbital -APAP-Caffeine  50-325-40 MG capsule Take 1 capsule by mouth every 4 (four) hours as needed for pain. 20 capsule 1   colestipol  (COLESTID ) 1 g tablet TAKE 1 TABLET BY MOUTH 2 TIMES A DAY 60 tablet 5   cyclobenzaprine  (FLEXERIL ) 10 MG tablet Take 1 tablet (10 mg total) by mouth at bedtime as needed for muscle spasms. 30 tablet 3   eletriptan  (RELPAX ) 40 MG tablet Take 1 tablet (40 mg total) by mouth as needed for migraine or headache. May repeat in 2 hours if headache persists or recurs. 10 tablet 2   EMGALITY  120 MG/ML SOAJ Inject 1 mL into the skin every 30 (thirty) days. 1.12 mL 6   leflunomide (ARAVA) 20 MG tablet Take 20 mg by mouth daily.     metoCLOPramide  (REGLAN ) 10 MG tablet Take 1 tablet (10 mg total) by mouth every 6 (six) hours as needed for nausea. 30 tablet 1   metoprolol  succinate (TOPROL  XL) 50 MG 24 hr tablet Take 1 tablet (50 mg total) by mouth daily. Take with or immediately following a meal. 30 tablet 5   pantoprazole  (PROTONIX ) 40 MG tablet TAKE 1 TABLET BY MOUTH DAILY 90 tablet 2   promethazine  (PHENERGAN ) 25 MG tablet TAKE ONE TABLET BY MOUTH EVERY 6 HOURS AS NEEDED FOR NAUSEA AND HEADACHE 20 tablet 1   Rimegepant Sulfate (NURTEC) 75 MG TBDP Take 1 tablet (75 mg total) by mouth every other day. 16 tablet 1   traMADol  (ULTRAM ) 50 MG tablet Take 50 mg  by mouth every 8 (eight) hours as needed.     tranexamic acid (LYSTEDA) 650 MG TABS tablet Tranexamic acid 2 tablets po every 8 hours x 5 days prn bleeding and as directed for procedures     VENTOLIN  HFA 108 (90 Base) MCG/ACT inhaler Inhale into the lungs.     No current facility-administered medications on file prior to visit.

## 2024-05-05 NOTE — Progress Notes (Signed)
 Ran test claim- Botox  PA required   Pharmacy Patient Advocate Encounter   Received notification from Pharmacy that prior authorization for Botox  200u is required/requested.   Insurance verification completed.   The patient is insured through [facility].   Key- [id number]  Awaiting clinical questions to generate.

## 2024-05-05 NOTE — Progress Notes (Signed)
 PA submitted

## 2024-05-05 NOTE — Telephone Encounter (Signed)
 PA submitted
